# Patient Record
Sex: Female | Born: 1995 | Race: Black or African American | Hispanic: No | Marital: Single | Smoking: Never smoker
Health system: Southern US, Community
[De-identification: ages and names within clinical notes are randomized; demographics above are authoritative.]

## PROBLEM LIST (undated history)

## (undated) ENCOUNTER — Inpatient Hospital Stay (HOSPITAL_COMMUNITY): Payer: Self-pay

## (undated) ENCOUNTER — Ambulatory Visit (HOSPITAL_COMMUNITY): Source: Home / Self Care

## (undated) ENCOUNTER — Ambulatory Visit (HOSPITAL_COMMUNITY): Admission: EM | Payer: Medicaid Other | Source: Home / Self Care

## (undated) DIAGNOSIS — B9689 Other specified bacterial agents as the cause of diseases classified elsewhere: Secondary | ICD-10-CM

## (undated) DIAGNOSIS — Z789 Other specified health status: Secondary | ICD-10-CM

## (undated) DIAGNOSIS — N390 Urinary tract infection, site not specified: Secondary | ICD-10-CM

## (undated) DIAGNOSIS — N760 Acute vaginitis: Secondary | ICD-10-CM

## (undated) HISTORY — DX: Other specified health status: Z78.9

## (undated) HISTORY — PX: NO PAST SURGERIES: SHX2092

---

## 2018-01-17 ENCOUNTER — Emergency Department (HOSPITAL_COMMUNITY)
Admission: EM | Admit: 2018-01-17 | Discharge: 2018-01-17 | Disposition: A | Discharge status: Home / Self Care | Payer: Self-pay | Attending: Emergency Medicine | Admitting: Emergency Medicine

## 2018-01-17 ENCOUNTER — Encounter (HOSPITAL_COMMUNITY): Payer: Self-pay | Admitting: Emergency Medicine

## 2018-01-17 DIAGNOSIS — R300 Dysuria: Secondary | ICD-10-CM | POA: Insufficient documentation

## 2018-01-17 DIAGNOSIS — N898 Other specified noninflammatory disorders of vagina: Secondary | ICD-10-CM | POA: Insufficient documentation

## 2018-01-17 LAB — URINALYSIS, ROUTINE W REFLEX MICROSCOPIC
BACTERIA UA: NONE SEEN
BILIRUBIN URINE: NEGATIVE
GLUCOSE, UA: NEGATIVE mg/dL
HGB URINE DIPSTICK: NEGATIVE
KETONES UR: NEGATIVE mg/dL
NITRITE: NEGATIVE
PROTEIN: NEGATIVE mg/dL
Specific Gravity, Urine: 1.025 (ref 1.005–1.030)
pH: 6 (ref 5.0–8.0)

## 2018-01-17 LAB — WET PREP, GENITAL
CLUE CELLS WET PREP: NONE SEEN
Sperm: NONE SEEN
Trich, Wet Prep: NONE SEEN
Yeast Wet Prep HPF POC: NONE SEEN

## 2018-01-17 LAB — POC URINE PREG, ED: PREG TEST UR: NEGATIVE

## 2018-01-17 MED ORDER — AZITHROMYCIN 250 MG PO TABS
1000.0000 mg | ORAL_TABLET | Freq: Once | ORAL | Status: AC
  Administered 2018-01-17: 1000 mg via ORAL
  Filled 2018-01-17: qty 4

## 2018-01-17 MED ORDER — LIDOCAINE HCL 1 % IJ SOLN
INTRAMUSCULAR | Status: AC
  Administered 2018-01-17: 20 mL
  Filled 2018-01-17: qty 20

## 2018-01-17 MED ORDER — CEFTRIAXONE SODIUM 250 MG IJ SOLR
250.0000 mg | Freq: Once | INTRAMUSCULAR | Status: AC
  Administered 2018-01-17: 250 mg via INTRAMUSCULAR
  Filled 2018-01-17: qty 250

## 2018-01-17 MED ORDER — MICONAZOLE NITRATE 1200 & 2 MG & % VA KIT: 1.0000 | PACK | Freq: Once | VAGINAL | 0 refills | Status: AC

## 2018-01-17 NOTE — Discharge Instructions (Addendum)
Your workup was reassuring today.  You have STD cultures pending.  Please use monistat as directed.  Follow up with your pcp or obgyn this week if symptoms arent improving.  If you develop worsening or new concerning symptoms you can return to the emergency department for re-evaluation.

## 2018-01-17 NOTE — ED Provider Notes (Signed)
[facility] DEPT Provider Note   CSN: [id number] Arrival date & time: 01/17/18  5465     History   Chief Complaint Chief Complaint  Patient presents with  . Dysuria  . Vaginal Itching    HPI Jamie Neal is a 22 y.o. female with no significant past medical history presents emergency department today for dysuria and vaginal itching.  Patient reports that she began vaginal itching approximately 2-3 days ago.  She reports that one day ago she began having pain after urination (not with).  She believes this may be related to a yeast infection.  She denies any associated vaginal bleeding, abdominal pain, flank pain, fever, nausea or vomiting. She says she is having her "normal discharge" that she always has.  She states she is sexually active with female partners and does not use protection.  She notes she has not been sexually active since her most recent STD testing at the health clinic which were negative.  She does not wish for HIV or syphilis testing today but does request gonorrhea and chlamydia cultures no interventions prior to arrival.  HPI  History reviewed. No pertinent past medical history.  There are no active problems to display for this patient.   History reviewed. No pertinent surgical history.   OB History   None      Home Medications    Prior to Admission medications   Not on File    Family History No family history on file.  Social History Social History   Tobacco Use  . Smoking status: Never Smoker  . Smokeless tobacco: Never Used  Substance Use Topics  . Alcohol use: Not on file  . Drug use: Not on file     Allergies   Patient has no known allergies.   Review of Systems Review of Systems  All other systems reviewed and are negative.    Physical Exam Updated Vital Signs BP 100/75 (BP Location: Left Arm)   Pulse 84   Temp 97.9 F (36.6 C) (Oral)   Resp 17   Ht 5' (1.524 m)   Wt 75 kg   LMP  01/03/2018   SpO2 98%   BMI 32.27 kg/m   Physical Exam  Constitutional: She appears well-developed and well-nourished.  HENT:  Head: Normocephalic and atraumatic.  Right Ear: External ear normal.  Left Ear: External ear normal.  Nose: Nose normal.  Mouth/Throat: Uvula is midline, oropharynx is clear and moist and mucous membranes are normal. No tonsillar exudate.  Eyes: Pupils are equal, round, and reactive to light. Right eye exhibits no discharge. Left eye exhibits no discharge. No scleral icterus.  Neck: Trachea normal. Neck supple. No spinous process tenderness present. No neck rigidity. Normal range of motion present.  Cardiovascular: Normal rate, regular rhythm and intact distal pulses.  No murmur heard. Pulses:      Radial pulses are 2+ on the right side, and 2+ on the left side.       Dorsalis pedis pulses are 2+ on the right side, and 2+ on the left side.       Posterior tibial pulses are 2+ on the right side, and 2+ on the left side.  No lower extremity swelling or edema. Calves symmetric in size bilaterally.  Pulmonary/Chest: Effort normal and breath sounds normal. She exhibits no tenderness.  Abdominal: Soft. Bowel sounds are normal. There is no tenderness. There is no rigidity, no rebound, no guarding and no CVA tenderness.  Genitourinary:  Genitourinary Comments:  Exam performed by Jillyn Ledger, exam chaperoned Pelvic exam: normal external genitalia without evidence of trauma. There is noted vaginal erythem and irritation. The cervix is normal appearing without lesions & cervical motion tenderness absent, cervical os closed with out purulent discharge; vaginal discharge - white and curd-like, Wet prep and DNA probe for chlamydia and GC obtained.   ADNEXA: normal adnexa in size, nontender and no masses UTERUS: uterus is normal size, shape, consistency and nontender.   Musculoskeletal: She exhibits no edema.  Lymphadenopathy:    She has no cervical adenopathy.    Neurological: She is alert.  Skin: Skin is warm and dry. No rash noted. She is not diaphoretic.  Psychiatric: She has a normal mood and affect.  Nursing note and vitals reviewed.    ED Treatments / Results  Labs (all labs ordered are listed, but only abnormal results are displayed) Labs Reviewed  WET PREP, GENITAL - Abnormal; Notable for the following components:      Result Value   WBC, Wet Prep HPF POC MANY (*)    All other components within normal limits  URINALYSIS, ROUTINE W REFLEX MICROSCOPIC - Abnormal; Notable for the following components:   Leukocytes, UA MODERATE (*)    All other components within normal limits  POC URINE PREG, ED  GC/CHLAMYDIA PROBE AMP (Page) NOT AT [facility]    EKG None  Radiology No results found.  Procedures Procedures (including critical care time)  Medications Ordered in ED Medications  cefTRIAXone (ROCEPHIN) injection 250 mg (250 mg Intramuscular Given 01/17/18 1230)  azithromycin (ZITHROMAX) tablet 1,000 mg (1,000 mg Oral Given 01/17/18 1235)  lidocaine (XYLOCAINE) 1 % (with pres) injection (20 mLs  Given 01/17/18 1230)     Initial Impression / Assessment and Plan / ED Course  I have reviewed the triage vital signs and the nursing notes.  Pertinent labs & imaging results that were available during my care of the patient were reviewed by me and considered in my medical decision making (see chart for details).     22 y.o. female with vaginal itching as well as dysuria after urination.  Patient is sexually active with female partners.  Patient vital signs are reassuring on presentation.  She denies any fever, abdominal pain, nausea/vomiting.  Abdominal exam is benign without any tenderness.  No peritoneal signs.  Pelvic exam without cervical motion tenderness.  No concern for PID.  Gonorrhea, chlamydia and wet prep cultures were obtained.  Patient's UA with moderate leukocytes but no bacteria seen.  I will not treat for UTI.  She is  without fever, nausea/vomiting, CVA tenderness.  Do not suspect pyelo.  Pregnancy test is negative.  Wet prep is with many white blood cells.  No evidence of BV, yeast infection or trichomonas.  Patient was treated prophylactically for gonorrhea and chlamydia with azithromycin and ceftriaxone.  Will treat vaginitis with Monistat.  Recommended follow-up with OB/GYN.  Patient understands she has cultures pending.  She is to remain sexually abstinent until G/C result. Patient did not wish for HIV/RPR testing. Return precautions discussed. Patient appears safe for discharge.   Final Clinical Impressions(s) / ED Diagnoses   Final diagnoses:  Vaginal itching    ED Discharge Orders         Ordered    miconazole (MONISTAT 1 COMBO PACK) kit   Once     01/17/18 1233           Lorelle Gibbs 01/17/18 1437    Isla Pence,  MD 01/17/18 1501

## 2018-01-17 NOTE — ED Triage Notes (Signed)
Pt reports pain with urinating since yesterday and vaginal itching for couple days. Pt not for sure since she recently started shaving down there if that is what is causing the itch or not. Pt also wants to be checked for STDs. Reports "having just the normal discharge".

## 2018-01-17 NOTE — ED Notes (Signed)
Assisted PA with pelvic exam ?

## 2018-01-17 NOTE — ED Notes (Signed)
Pt ambulatory to restroom

## 2018-01-18 LAB — GC/CHLAMYDIA PROBE AMP (~~LOC~~) NOT AT ARMC
CHLAMYDIA, DNA PROBE: NEGATIVE
NEISSERIA GONORRHEA: NEGATIVE

## 2018-02-01 ENCOUNTER — Emergency Department (HOSPITAL_COMMUNITY)
Admission: EM | Admit: 2018-02-01 | Discharge: 2018-02-01 | Discharge status: Against Medical Advice | Payer: Self-pay | Attending: Emergency Medicine | Admitting: Emergency Medicine

## 2018-02-01 ENCOUNTER — Encounter (HOSPITAL_COMMUNITY): Payer: Self-pay | Admitting: Emergency Medicine

## 2018-02-01 ENCOUNTER — Other Ambulatory Visit: Payer: Self-pay

## 2018-02-01 DIAGNOSIS — Z202 Contact with and (suspected) exposure to infections with a predominantly sexual mode of transmission: Secondary | ICD-10-CM | POA: Insufficient documentation

## 2018-02-01 DIAGNOSIS — Z5321 Procedure and treatment not carried out due to patient leaving prior to being seen by health care provider: Secondary | ICD-10-CM | POA: Insufficient documentation

## 2018-02-01 NOTE — ED Triage Notes (Signed)
Pt verbalizes would like to be checked for STD related to having new partner; denies symptoms.

## 2018-02-03 NOTE — ED Provider Notes (Signed)
My student went to evaluate the patient and advised that she was here to be checked for STDs that she was dating someone new.  I went to evaluate the patient and walked into the room there is nobody there.  The patient told the nurse she was upset because she did not understand why we could not do a mouth swab for STDs.  The patient thought that all the HIV and STD testing only required a mouth swab.  Again I was unable to evaluate the patient due to the fact that she had left before I was able to get into the room.   Charlestine Night, PA-C 02/03/18 0045    Sabas Sous, MD 02/03/18 2221

## 2019-07-21 ENCOUNTER — Encounter: Payer: Self-pay | Admitting: *Deleted

## 2019-08-16 ENCOUNTER — Ambulatory Visit (HOSPITAL_COMMUNITY)
Admission: EM | Admit: 2019-08-16 | Discharge: 2019-08-16 | Disposition: A | Discharge status: Home / Self Care | Payer: Medicaid - Out of State | Attending: Emergency Medicine | Admitting: Emergency Medicine

## 2019-08-16 ENCOUNTER — Other Ambulatory Visit: Payer: Self-pay

## 2019-08-16 ENCOUNTER — Encounter (HOSPITAL_COMMUNITY): Payer: Self-pay

## 2019-08-16 ENCOUNTER — Inpatient Hospital Stay (HOSPITAL_COMMUNITY): Payer: Medicaid Other

## 2019-08-16 ENCOUNTER — Inpatient Hospital Stay (HOSPITAL_COMMUNITY)
Admission: AD | Admit: 2019-08-16 | Discharge: 2019-08-16 | Discharge status: Against Medical Advice | Payer: Medicaid Other | Attending: Obstetrics & Gynecology | Admitting: Obstetrics & Gynecology

## 2019-08-16 ENCOUNTER — Encounter (HOSPITAL_COMMUNITY): Payer: Self-pay | Admitting: Obstetrics & Gynecology

## 2019-08-16 DIAGNOSIS — Z5329 Procedure and treatment not carried out because of patient's decision for other reasons: Secondary | ICD-10-CM | POA: Diagnosis not present

## 2019-08-16 DIAGNOSIS — O26891 Other specified pregnancy related conditions, first trimester: Secondary | ICD-10-CM | POA: Insufficient documentation

## 2019-08-16 DIAGNOSIS — Z3A11 11 weeks gestation of pregnancy: Secondary | ICD-10-CM | POA: Diagnosis not present

## 2019-08-16 DIAGNOSIS — R109 Unspecified abdominal pain: Secondary | ICD-10-CM | POA: Insufficient documentation

## 2019-08-16 DIAGNOSIS — Z3201 Encounter for pregnancy test, result positive: Secondary | ICD-10-CM | POA: Diagnosis not present

## 2019-08-16 DIAGNOSIS — Z3A10 10 weeks gestation of pregnancy: Secondary | ICD-10-CM | POA: Insufficient documentation

## 2019-08-16 DIAGNOSIS — O26899 Other specified pregnancy related conditions, unspecified trimester: Secondary | ICD-10-CM

## 2019-08-16 DIAGNOSIS — R102 Pelvic and perineal pain: Secondary | ICD-10-CM

## 2019-08-16 DIAGNOSIS — N898 Other specified noninflammatory disorders of vagina: Secondary | ICD-10-CM

## 2019-08-16 LAB — COMPREHENSIVE METABOLIC PANEL
ALT: 12 U/L (ref 0–44)
AST: 17 U/L (ref 15–41)
Albumin: 3.4 g/dL — ABNORMAL LOW (ref 3.5–5.0)
Alkaline Phosphatase: 43 U/L (ref 38–126)
Anion gap: 9 (ref 5–15)
BUN: 9 mg/dL (ref 6–20)
CO2: 24 mmol/L (ref 22–32)
Calcium: 9.2 mg/dL (ref 8.9–10.3)
Chloride: 105 mmol/L (ref 98–111)
Creatinine, Ser: 0.57 mg/dL (ref 0.44–1.00)
GFR calc Af Amer: >60 mL/min (ref >60)
GFR calc non Af Amer: >60 mL/min (ref >60)
Glucose, Bld: 75 mg/dL (ref 70–99)
Potassium: 3.6 mmol/L (ref 3.5–5.1)
Sodium: 138 mmol/L (ref 135–145)
Total Bilirubin: 0.8 mg/dL (ref 0.3–1.2)
Total Protein: 7.6 g/dL (ref 6.5–8.1)

## 2019-08-16 LAB — POCT URINALYSIS DIP (DEVICE)
Glucose, UA: NEGATIVE mg/dL
Ketones, ur: 40 mg/dL — AB
Nitrite: NEGATIVE
Protein, ur: 30 mg/dL — AB
Specific Gravity, Urine: >=1.03 (ref 1.005–1.030)
Urobilinogen, UA: 1 mg/dL (ref 0.0–1.0)
pH: 6 (ref 5.0–8.0)

## 2019-08-16 LAB — URINALYSIS, ROUTINE W REFLEX MICROSCOPIC
Bacteria, UA: NONE SEEN
Bilirubin Urine: NEGATIVE
Glucose, UA: NEGATIVE mg/dL
Ketones, ur: 5 mg/dL — AB
Leukocytes,Ua: NEGATIVE
Nitrite: NEGATIVE
Protein, ur: 30 mg/dL — AB
Specific Gravity, Urine: 1.032 — ABNORMAL HIGH (ref 1.005–1.030)
pH: 5 (ref 5.0–8.0)

## 2019-08-16 LAB — HCG, QUANTITATIVE, PREGNANCY: hCG, Beta Chain, Quant, S: 180024 m[IU]/mL — ABNORMAL HIGH (ref <5)

## 2019-08-16 LAB — ABO/RH: ABO/RH(D): O POS

## 2019-08-16 LAB — CBC
HCT: 38.9 % (ref 36.0–46.0)
Hemoglobin: 13.5 g/dL (ref 12.0–15.0)
MCH: 31.3 pg (ref 26.0–34.0)
MCHC: 34.7 g/dL (ref 30.0–36.0)
MCV: 90 fL (ref 80.0–100.0)
Platelets: 226 10*3/uL (ref 150–400)
RBC: 4.32 MIL/uL (ref 3.87–5.11)
RDW: 13.3 % (ref 11.5–15.5)
WBC: 6.7 10*3/uL (ref 4.0–10.5)
nRBC: 0 % (ref 0.0–0.2)

## 2019-08-16 LAB — POC URINE PREG, ED: Preg Test, Ur: POSITIVE — AB

## 2019-08-16 LAB — POCT PREGNANCY, URINE: Preg Test, Ur: POSITIVE — AB

## 2019-08-16 MED ORDER — METRONIDAZOLE 0.75 % VA GEL: 1.0000 | Freq: Every day | VAGINAL | 0 refills | Status: AC

## 2019-08-16 NOTE — Progress Notes (Signed)
Pt decided to leave AMA, form signed and put with medical records.

## 2019-08-16 NOTE — ED Provider Notes (Signed)
[facility]    CSN: [id number] Arrival date & time: 08/16/19  1135      History   Chief Complaint Chief Complaint  Patient presents with  . Vaginal Discharge  . Possible Pregnancy    HPI Jamie Neal is a 24 y.o. female no significant past medical history presenting today for evaluation of pregnancy, discharge and abdominal pain.  Patient has had at home pregnancy tests that are positive.  Last menstrual cycle was around February.  She reports that she has had discharge and odor which has progressively worsening over the past month.  She feels similar to prior BV infections.  She also has associated urinary frequency which she typically experiences with BV.  Denies dysuria.  She has had some occasional intermittent abdominal cramping.  Denies currently.  Has had 1 prior pregnancy with no complications.  Has not establish care with OB/GYN yet.  HPI  History reviewed. No pertinent past medical history.  There are no problems to display for this patient.   History reviewed. No pertinent surgical history.  OB History    Gravida  1   Para      Term      Preterm      AB      Living        SAB      TAB      Ectopic      Multiple      Live Births               Home Medications    Prior to Admission medications   Medication Sig Start Date End Date Taking? Authorizing Provider  metroNIDAZOLE (METROGEL VAGINAL) 0.75 % vaginal gel Place 1 Applicatorful vaginally at bedtime for 5 days. 08/16/19 08/21/19  Chevi Lim, Junius Creamer, PA-C    Family History History reviewed. No pertinent family history.  Social History Social History   Tobacco Use  . Smoking status: Never Smoker  . Smokeless tobacco: Never Used  Substance Use Topics  . Alcohol use: Not on file  . Drug use: Not on file     Allergies   Patient has no known allergies.   Review of Systems Review of Systems  Constitutional: Negative for fever.  Respiratory: Negative for shortness  of breath.   Cardiovascular: Negative for chest pain.  Gastrointestinal: Negative for abdominal pain, diarrhea, nausea and vomiting.  Genitourinary: Positive for frequency and vaginal discharge. Negative for dysuria, flank pain, genital sores, hematuria, menstrual problem, vaginal bleeding and vaginal pain.  Musculoskeletal: Negative for back pain.  Skin: Negative for rash.  Neurological: Negative for dizziness, light-headedness and headaches.     Physical Exam Triage Vital Signs ED Triage Vitals [08/16/19 1223]  Enc Vitals Group     BP 100/69     Pulse Rate 72     Resp 16     Temp 98.4 F (36.9 C)     Temp Source Oral     SpO2 100 %     Weight      Height      Head Circumference      Peak Flow      Pain Score 0     Pain Loc      Pain Edu?      Excl. in GC?    No data found.  Updated Vital Signs BP 100/69 (BP Location: Left Arm)   Pulse 72   Temp 98.4 F (36.9 C) (Oral)   Resp 16   SpO2  100%   Visual Acuity Right Eye Distance:   Left Eye Distance:   Bilateral Distance:    Right Eye Near:   Left Eye Near:    Bilateral Near:     Physical Exam Vitals and nursing note reviewed.  Constitutional:      Appearance: She is well-developed.     Comments: No acute distress  HENT:     Head: Normocephalic and atraumatic.     Nose: Nose normal.  Eyes:     Conjunctiva/sclera: Conjunctivae normal.  Cardiovascular:     Rate and Rhythm: Normal rate.  Pulmonary:     Effort: Pulmonary effort is normal. No respiratory distress.  Abdominal:     General: There is no distension.  Genitourinary:    Comments: Normal external female genitalia, no rashes or lesions noted externally, vaginal mucosa pink, white thicker discharge noted Musculoskeletal:        General: Normal range of motion.     Cervical back: Neck supple.  Skin:    General: Skin is warm and dry.  Neurological:     Mental Status: She is alert and oriented to person, place, and time.      UC Treatments /  Results  Labs (all labs ordered are listed, but only abnormal results are displayed) Labs Reviewed  POC URINE PREG, ED - Abnormal; Notable for the following components:      Result Value   Preg Test, Ur POSITIVE (*)    All other components within normal limits  POCT URINALYSIS DIP (DEVICE) - Abnormal; Notable for the following components:   Bilirubin Urine SMALL (*)    Ketones, ur 40 (*)    Hgb urine dipstick SMALL (*)    Protein, ur 30 (*)    Leukocytes,Ua SMALL (*)    All other components within normal limits  POCT PREGNANCY, URINE - Abnormal; Notable for the following components:   Preg Test, Ur POSITIVE (*)    All other components within normal limits  URINE CULTURE  CERVICOVAGINAL ANCILLARY ONLY    EKG   Radiology No results found.  Procedures Procedures (including critical care time)  Medications Ordered in UC Medications - No data to display  Initial Impression / Assessment and Plan / UC Course  I have reviewed the triage vital signs and the nursing notes.  Pertinent labs & imaging results that were available during my care of the patient were reviewed by me and considered in my medical decision making (see chart for details).     Pregnancy test positive.  Will send urine for culture.  Typically has urinary symptoms with BV, patient feels strongly symptoms are related to BV.  Discussed with patient empirically treating versus deferring in setting of pregnancy and patient wished to go ahead and proceed with treatment, will provide MetroGel to use over the next 5 days.  Will call with results of urine culture and vaginal swab and provide further treatment as needed.  Abdominal pain currently intermittent and currently without pain, likely in relation to discharge/possible BV.  Advised patient if pain progressing or worsening to follow-up at MAU for further evaluation.  Provided contacts for setting up care with OB/GYN.  Discussed strict return precautions. Patient  verbalized understanding and is agreeable with plan.  Final Clinical Impressions(s) / UC Diagnoses   Final diagnoses:  Positive pregnancy test  Vaginal discharge     Discharge Instructions     Please establish care with OB/GYN for follow-up prenatal care Urine culture pending to check for  UTI Vaginal swab pending to screen for STDs/yeast and BV May begin MetroGel at bedtime for the next 5 days  If you develop returning abdominal pain please follow-up at [facility] for further evaluation   ED Prescriptions    Medication Sig Dispense Auth. Provider   metroNIDAZOLE (METROGEL VAGINAL) 0.75 % vaginal gel Place 1 Applicatorful vaginally at bedtime for 5 days. 70 g Elton Catalano, Winfield C, PA-C     PDMP not reviewed this encounter.   Janith Lima, PA-C 08/16/19 1324

## 2019-08-16 NOTE — MAU Provider Note (Signed)
First Provider Initiated Contact with Patient 08/16/19 1529      S Ms. Anaiah Mcmannis is a 24 y.o. G2P1001 pregnant female at [redacted]w[redacted]d who presents to MAU today with complaint of cramping. Patient was seen in the ED earlier today and evaluated for vaginal discharge and abdominal pain and did report positive pregnancy test at home. Patient was not transferred to MAU for evaluation.  O BP (!) 103/53 (BP Location: Right Arm)   Pulse 71   Temp 98.3 F (36.8 C) (Oral)   Resp 16   Ht 5' (1.524 m)   Wt 76.4 kg   SpO2 100% Comment: room air  BMI 32.91 kg/m    Patient Vitals for the past 24 hrs:  BP Temp Temp src Pulse Resp SpO2 Height Weight  08/16/19 1343 (!) 103/53 98.3 F (36.8 C) Oral 71 16 100 % -- --  08/16/19 1340 -- -- -- -- -- -- 5' (1.524 m) 76.4 kg   Physical Exam  Constitutional: She is oriented to person, place, and time. She appears well-developed and well-nourished. No distress.  HENT:  Head: Normocephalic and atraumatic.  Respiratory: Effort normal.  Neurological: She is alert and oriented to person, place, and time.  Skin: She is not diaphoretic.  Psychiatric: She has a normal mood and affect. Her behavior is normal. Judgment and thought content normal.   A Pregnant female Medical screening exam complete Ectopic work-up With labs and [place] report pending, patient comes to nurses station dressed saying she needs to leave Patient informed by provider that results not yet available and patient will need to sign out AMA as final diagnosis cannot be made. Patient willing to sign AMA paperwork and requesting [place] photos.  P Pt left MAU AMA pending labs and ultrasound report  Laterra Lubinski, Odie Sera, NP 08/16/2019 3:57 PM

## 2019-08-16 NOTE — ED Triage Notes (Signed)
Pt present vaginal discharge with foul odor and pregnancy. Symptoms started over a month ago but has gotten worst with the odor.

## 2019-08-16 NOTE — MAU Note (Signed)
Jamie Neal is a 24 y.o. at [redacted]w[redacted]d here in MAU reporting: cramping since 2 days ago. Has not tried any medication. No bleeding or discharge.  Onset of complaint: 2 days ago  Pain score: 7/10  Vitals:   08/16/19 1343  BP: (!) 103/53  Pulse: 71  Resp: 16  Temp: 98.3 F (36.8 C)  SpO2: 100%     Lab orders placed from triage: UA

## 2019-08-16 NOTE — Discharge Instructions (Signed)
Please establish care with OB/GYN for follow-up prenatal care Urine culture pending to check for UTI Vaginal swab pending to screen for STDs/yeast and BV May begin MetroGel at bedtime for the next 5 days  If you develop returning abdominal pain please follow-up at [facility] for further evaluation

## 2019-08-17 LAB — URINE CULTURE: Culture: <10000 — AB

## 2019-08-18 ENCOUNTER — Other Ambulatory Visit: Payer: Self-pay

## 2019-08-18 ENCOUNTER — Ambulatory Visit (INDEPENDENT_AMBULATORY_CARE_PROVIDER_SITE_OTHER): Payer: Medicaid Other | Admitting: *Deleted

## 2019-08-18 DIAGNOSIS — E66811 Obesity, class 1: Secondary | ICD-10-CM | POA: Insufficient documentation

## 2019-08-18 DIAGNOSIS — E669 Obesity, unspecified: Secondary | ICD-10-CM

## 2019-08-18 DIAGNOSIS — F419 Anxiety disorder, unspecified: Secondary | ICD-10-CM

## 2019-08-18 DIAGNOSIS — Z3490 Encounter for supervision of normal pregnancy, unspecified, unspecified trimester: Secondary | ICD-10-CM | POA: Insufficient documentation

## 2019-08-18 LAB — CERVICOVAGINAL ANCILLARY ONLY
Bacterial Vaginitis (gardnerella): POSITIVE — AB
Candida Glabrata: NEGATIVE
Candida Vaginitis: POSITIVE — AB
Chlamydia: NEGATIVE
Comment: NEGATIVE
Comment: NEGATIVE
Comment: NEGATIVE
Comment: NEGATIVE
Comment: NEGATIVE
Comment: NORMAL
Neisseria Gonorrhea: NEGATIVE
Trichomonas: NEGATIVE

## 2019-08-18 NOTE — Progress Notes (Signed)
I connected with  Jamie Neal on 08/18/19 at  1:30 PM EDT by telephone and verified that I am speaking with the correct person using two identifiers.   I discussed the limitations, risks, security and privacy concerns of performing an evaluation and management service by telephone and the availability of in person appointments. I also discussed with the patient that there may be a patient responsible charge related to this service. The patient expressed understanding and agreed to proceed.  I explained I am completing her New OB Intake today. We discussed Her EDD and that it is based on early [place] .She reports LMP was around 06/06/19 not 05/30/19 as reported. I explained provider will review EDD at her new ob visit and may change it.  I reviewed her allergies, meds, OB History, Medical /Surgical history, and appropriate screenings. I informed her of [facility] services. She did have + PHQ9 and GAD 7 , I offered referral to [facility] . She accepted. I explained registar will contact her with appointment.  She has had some food insecurity and states getting better. I explained will put food resources in her visit notes and she can see on MyChart or get a copy next week.  I explained I will send her the [employer] app and app was sent to her while on phone- she will download after call completed.  I explained we will ask that she purchase a  blood pressure cuff  To check her blood pressure weekly because her Insurance [facility]) does not cover blood pressure cuff RX. I  Explained  then we will have her take her blood pressure weekly and enter into the app. I explained she will have some visits in office and some virtually. I sent her MyChart text to sign up for  MyChart app. She will download after call completed.  I reviewed her new ob  appointment date/ time with her , our location and to wear mask, no visitors.  I explained she will have a pelvic exam, ob bloodwork, hemoglobin a1C, cbg ,pap, and  genetic testing if desired,- she  does want a panorama. I scheduled an [place] at 19 weeks and gave her the appointment. She voices understanding.   Jamie Nikkel,RN 08/18/2019  1:25 PM

## 2019-08-18 NOTE — Progress Notes (Signed)
I have reviewed the chart and agree with nursing staff's documentation of this patient's encounter.  Raelyn Mora, CNM 08/18/2019 9:12 PM

## 2019-08-18 NOTE — Patient Instructions (Signed)
At [facility] [employer], we work as an integrated team, providing care to address both physical and emotional health. Your medical provider may refer you to see our [facility]) on the same day you see your medical provider, as availability permits.  Our [facility] is available to all patients, visits generally last between 20-30 minutes, but can be longer or shorter, depending on patient need. The [facility] offers help with stress management, coping with symptoms of depression and anxiety, major life changes , sleep issues, changing risky behavior, grief and loss, life stress, working on personal life goals, and  behavioral health issues, as these all affect your overall health and wellness.  The [facility] is NOT available for the following: court-ordered evaluations, specialty assessments (custody or disability), letters to employers, or obtaining certification for an emotional support animal. The [facility] does not provide long-term therapeutic services. You have the right to refuse integrated behavioral health services, or to reschedule to see the [facility] at a later date.  Exception: If you are having thoughts of suicide, we require that you either see the [facility] for further assessment, or contract for safety with your medical provider prior to checking out.  Confidentiality exception: If it is suspected that a child or disabled adult is being abused or neglected, we are required by law to report that to either Child Protective Services or Adult Scientist, forensic.  If you have a diagnosis of Bipolar affective disorder, Schizophrenia, or recurrent Major depressive disorder, we will recommend that you establish care with a psychiatrist, as these are lifelong, chronic conditions, and we want your overall emotional health and medications to be more closely monitored. If you anticipate needing extended maternity leave due to mental illness, it it recommended that you find a psychiatrist as soon as possible.  Neither the medical provider, nor the [facility], can recommend an extended maternity leave for mental health issues. Your medical provider or [facility] may refer you to a therapist for ongoing, traditional therapy, or to a psychiatrist, for medication management, if it would benefit your overall health. Depending on your insurance, you may have a copay to see the [facility]. If you are uninsured, it is recommended that you apply for financial assistance. (Forms may be requested at the front desk for in-person visits, via MyChart, or request a form during a virtual visit).  If you see the [facility] more than 6 times, you will have to complete a comprehensive clinical assessment interview with the [facility] to resume integrated services.  Any questions?   [facility] [address] [place], [place] [address] [phone]   or  www.http://james-[email or web address]/ **SNAP/EBT/ Other nutritional benefits  [facility] [address], [place], [place] [address] [phone]  or  https://palmer-smith.com/ **WIC for  women who are pregnant and postpartum, infants and children up to 77 years old  [place] [address] [place], [place] [address] [phone]   or   www.theblessedtable.org  **Food pantry  [employer] [address], [place], [place] [address] [phone]   or   https://brotherkolbes.godaddysites.com  **Emergency food and prepared meals  [place] [address], [place], [place] [address] [phone]   or   www.cedargrovetop.us **Food pantry  [place] [facility] [address] [place],  [address] [phone]   or   www.https://hartman-jones.net/ **Food pantry  [facility]  Food Pantry [address]  [place], [place] [address] [phone] **Food pantry  [facility] [address] [place], [place] [address] [phone]   or   www.greensborourbanministry.org  Insurance underwriter and prepared meals  [facility] [address] [place], [place], [place], [place] [address] DomainerFinder.be  **Food pantry  [place] [employer] [facility] [address] [place], [place] [address] [phone]   or   www.lbcnow.org  **Food pantry  [facility] [address], [place], [place] [address] [phone]   or   http://patterson-parker.net/ **Food pantry, nutrition education, gardening activities  [place] [address], [place], [place] [address] [phone] **Food pantry  [facility] [place] [address], [place], [place] [address] [phone]   or   www.salvationarmyofgreensboro.Lovette Cliche of [address], [place], [place] [address] [phone]   or   http://senior-resources-[place].org [facility] on [place] [address] [place], [place] [address] [phone]   or   www.stmattchurch.com  **Food pantry  [place] [address] [place], [place] [address] [phone]   or   vandaliapresbyterianchurch.org **Food pantry  [place] [facility] [address] [place], [place], [place] [address] [phone]   or   www.Mormon101.be Food Civil Service fast streamer of [place] [address] Jacinto Reap [place], [place] [address] [phone] **Food pantry  [place] [facility]   Department of [facility] [address] [place], [place] [address] [phone]   or   www.co.[place].us/ph/  [facility]) [address], [place], [place] [address] [phone]   or   MedicationWebsites.com.au **WIC for pregnant and postpartum women, infants and children up to 33 years old  [facility] [address], [place], [place] [address] [phone] **Food pantry  [place] [address] [place], [place] [address] [phone]   or   emerywoodbaptistchurch.com *Food pantry  [employer] [facility] [address], [place], [place] [address] [phone]   or   www.fcchighpoint.Radonna RickerFood pantry  [facility] [address], [place], [place] [address] [phone]   or   http://www.green.com/ **Food pantry  [place] [address], [place], [place] [address] [phone]   or   www.facebook.com/KBCI1 Social worker of [place] [address], [place], [place] [address] [phone]   or   www.abbottscreek.org [employer] of [place] [phone]   **Delivers meals  [facility] [address], [place], [place] [address] [phone]   or   nbfgm.sundaystreamwebsites.com  Programme researcher, broadcasting/film/video of [employer] [address] [place], [place] [address] [phone]   or   www.odm-hp.org  **Food pantry  [place] [address] [place], [place] [address] [phone]   or   R2live.tv **Food pantry  [place] [address], [place], [place] [address] [phone]   or   ClubMonetize.fr **Emergency food and pet food  [facility] [place] [address] [place], [place] [address] [phone]   or   www.senioradults.org **Congregate and delivered meals to older adults  [place] Way of [place] [address] [place],  [place] [address] [phone]  or   https://www.miller-montoya.com/ **Back Pack Program for elementary school students  [place] [facility] [address] Cinda Quest [place], [place] [address] [phone]   or   www.wardstreetcommunityresources.org **Food pantry  [facility] [address] [place], [place] [address] [phone]   or   ResumeQuery.com.ee **Emergency food, nutrition classes, food budgeting

## 2019-08-19 ENCOUNTER — Telehealth (HOSPITAL_COMMUNITY): Payer: Self-pay

## 2019-08-19 MED ORDER — AMOXICILLIN 500 MG PO CAPS: 500.0000 mg | ORAL_CAPSULE | Freq: Two times a day (BID) | ORAL | 0 refills | Status: AC

## 2019-08-19 MED ORDER — TERCONAZOLE 0.4 % VA CREA: 1.0000 | TOPICAL_CREAM | Freq: Every day | VAGINAL | 0 refills | Status: DC

## 2019-08-19 NOTE — BH Specialist Note (Signed)
Pt did not arrive to video visit and did not answer the phone ; Left HIPPA-compliant message to call back Jamie Neal from [facility] at [phone]. ; Pt does not have MyChart.    Integrated Behavioral Health via Telemedicine Video Visit  08/19/2019 Jamie Neal [id number]   [name]  Depression screen [facility] 2/9 08/18/2019  Decreased Interest 1  Down, Depressed, Hopeless 1  PHQ - 2 Score 2  Altered sleeping 1  Tired, decreased energy 3  Change in appetite 3  Feeling bad or failure about yourself  0  Trouble concentrating 1  Moving slowly or fidgety/restless 0  Suicidal thoughts 0  PHQ-9 Score 10   GAD 7 : Generalized Anxiety Score 08/18/2019  Nervous, Anxious, on Edge 1  Control/stop worrying 3  Worry too much - different things 1  Trouble relaxing 1  Restless 0  Easily annoyed or irritable 3  Afraid - awful might happen 1  Total GAD 7 Score 10

## 2019-08-25 ENCOUNTER — Telehealth: Payer: Self-pay | Admitting: Obstetrics and Gynecology

## 2019-08-25 ENCOUNTER — Ambulatory Visit: Payer: Medicaid Other | Admitting: Clinical

## 2019-08-25 ENCOUNTER — Encounter: Payer: Self-pay | Admitting: Obstetrics and Gynecology

## 2019-08-25 NOTE — Telephone Encounter (Signed)
Attempted to contact patient to get her rescheduled for her missed ob appointment. No answer, left voicemail for patient to give the office a call back to be rescheduled.  

## 2019-10-20 ENCOUNTER — Ambulatory Visit: Payer: Medicaid Other

## 2019-10-20 ENCOUNTER — Inpatient Hospital Stay (HOSPITAL_COMMUNITY)
Admission: AD | Admit: 2019-10-20 | Discharge: 2019-10-20 | Disposition: A | Discharge status: Home / Self Care | Payer: Medicaid Other | Attending: Obstetrics and Gynecology | Admitting: Obstetrics and Gynecology

## 2019-10-20 ENCOUNTER — Encounter (HOSPITAL_COMMUNITY): Payer: Self-pay | Admitting: Obstetrics and Gynecology

## 2019-10-20 ENCOUNTER — Other Ambulatory Visit: Payer: Self-pay

## 2019-10-20 DIAGNOSIS — O26899 Other specified pregnancy related conditions, unspecified trimester: Secondary | ICD-10-CM

## 2019-10-20 DIAGNOSIS — Z3A19 19 weeks gestation of pregnancy: Secondary | ICD-10-CM | POA: Diagnosis not present

## 2019-10-20 DIAGNOSIS — O26892 Other specified pregnancy related conditions, second trimester: Secondary | ICD-10-CM | POA: Insufficient documentation

## 2019-10-20 DIAGNOSIS — R109 Unspecified abdominal pain: Secondary | ICD-10-CM | POA: Diagnosis not present

## 2019-10-20 DIAGNOSIS — Z3492 Encounter for supervision of normal pregnancy, unspecified, second trimester: Secondary | ICD-10-CM

## 2019-10-20 DIAGNOSIS — E669 Obesity, unspecified: Secondary | ICD-10-CM

## 2019-10-20 LAB — URINALYSIS, ROUTINE W REFLEX MICROSCOPIC
Bilirubin Urine: NEGATIVE
Glucose, UA: NEGATIVE mg/dL
Hgb urine dipstick: NEGATIVE
Ketones, ur: NEGATIVE mg/dL
Leukocytes,Ua: NEGATIVE
Nitrite: NEGATIVE
Protein, ur: NEGATIVE mg/dL
Specific Gravity, Urine: 1.018 (ref 1.005–1.030)
pH: 8 (ref 5.0–8.0)

## 2019-10-20 LAB — WET PREP, GENITAL
Clue Cells Wet Prep HPF POC: NONE SEEN
Sperm: NONE SEEN
Trich, Wet Prep: NONE SEEN
Yeast Wet Prep HPF POC: NONE SEEN

## 2019-10-20 NOTE — MAU Note (Addendum)
Presents with c/o intermittent sharp abdominal pain, reports "it's not consistent like ctxs". State not currently having pain, but feels it once lying down. Denies VB.

## 2019-10-20 NOTE — MAU Provider Note (Signed)
History     CSN: [id number]  Arrival date and time: 10/20/19 1043    First Provider Initiated Contact with Patient 10/20/19 1217       Chief Complaint  Patient presents with  . Abdominal Pain   [name] is a 24 y.o. year old G53P1001 female at [redacted]w[redacted]d weeks gestation who presents to MAU reporting lower intermittent sharp abdominal pain that is "not consistent like contractions".  She currently denies having any pain but feels it once she lies down.  She denies vaginal bleeding or any abnormal vaginal discharge.  Per epic she was scheduled for an ultrasound this morning at 8:30 AM and did not keep that appointment.  She states she was unaware of any appointments scheduled at the [facility].   OB History    Gravida  2   Para  1   Term  1   Preterm      AB      Living  1     SAB      TAB      Ectopic      Multiple      Live Births  1           History reviewed. No pertinent past medical history.  Past Surgical History:  Procedure Laterality Date  . NO PAST SURGERIES      Family History  Problem Relation Age of Onset  . Hypertension Father     Social History   Tobacco Use  . Smoking status: Never Smoker  . Smokeless tobacco: Never Used  Vaping Use  . Vaping Use: Never used  Substance Use Topics  . Alcohol use: Not Currently    Comment: occasionally  . Drug use: Never    Allergies: No Known Allergies  Medications Prior to Admission  Medication Sig Dispense Refill Last Dose  . Prenatal MV-Min-FA-Omega-3 (PRENATAL GUMMIES/DHA & FA PO) Take 2 tablets by mouth daily.     Marland Kitchen terconazole (TERAZOL 7) 0.4 % vaginal cream Place 1 applicator vaginally at bedtime. 45 g 0     Review of Systems  Constitutional: Negative.   HENT: Negative.   Eyes: Negative.   Respiratory: Negative.   Cardiovascular: Negative.   Gastrointestinal: Negative.   Endocrine: Negative.   Genitourinary: Positive for pelvic pain (intermittent, sharp. None now  -- only felt with lying down).  Musculoskeletal: Negative.   Skin: Negative.   Allergic/Immunologic: Negative.   Neurological: Negative.   Hematological: Negative.   Psychiatric/Behavioral: Negative.    Physical Exam   Blood pressure 103/61, pulse 90, temperature 98.4 F (36.9 C), temperature source Oral, resp. rate 18, height 5' (1.524 m), weight 78.2 kg, last menstrual period 06/06/2019, SpO2 100 %.  Physical Exam  Nursing note and vitals reviewed. Constitutional: She is oriented to person, place, and time. She appears well-developed.  HENT:  Head: Normocephalic and atraumatic.  Cardiovascular: Normal rate and regular rhythm.  Respiratory: Effort normal.  GI: Soft.  Genitourinary:    Genitourinary Comments: Speculum declined by patient   Neurological: She is alert and oriented to person, place, and time.  Skin: Skin is warm and dry.  Psychiatric: Her behavior is normal. Mood normal.   FHTs by doppler: 156 bpm  MAU Course  Procedures   Patient informed that the ultrasound is considered a limited OB ultrasound and is not intended to be a complete ultrasound exam.  Patient also informed that the ultrasound is not being completed with the intent of assessing  for fetal or placental anomalies or any pelvic abnormalities.  Explained that the purpose of today's ultrasound is to assess for patient reassurance.  Baby was found to be in a viable and appears to be S=D. Patient acknowledges the purpose of the exam and the limitations of the study.    MDM Wet Prep -- self-swab per pt request GC/CT -- self-swab per pt request  Results for orders placed or performed during the hospital encounter of 10/20/19 (from the past 24 hour(s))  Urinalysis, Routine w reflex microscopic     Status: None   Collection Time: 10/20/19 11:28 AM  Result Value Ref Range   Color, Urine YELLOW YELLOW   APPearance CLEAR CLEAR   Specific Gravity, Urine 1.018 1.005 - 1.030   pH 8.0 5.0 - 8.0   Glucose, UA  NEGATIVE NEGATIVE mg/dL   Hgb urine dipstick NEGATIVE NEGATIVE   Bilirubin Urine NEGATIVE NEGATIVE   Ketones, ur NEGATIVE NEGATIVE mg/dL   Protein, ur NEGATIVE NEGATIVE mg/dL   Nitrite NEGATIVE NEGATIVE   Leukocytes,Ua NEGATIVE NEGATIVE  Wet prep, genital     Status: Abnormal   Collection Time: 10/20/19 12:29 PM  Result Value Ref Range   Yeast Wet Prep HPF POC NONE SEEN NONE SEEN   Trich, Wet Prep NONE SEEN NONE SEEN   Clue Cells Wet Prep HPF POC NONE SEEN NONE SEEN   WBC, Wet Prep HPF POC MODERATE (A) NONE SEEN   Sperm NONE SEEN     Assessment and Plan  Abdominal pain affecting pregnancy  - Information provided on abdominal pain in pregnancy - Advised to call [facility] ASAP to get scheduled for [facility] - Msg sent to [facility] Admin pool to get patient rescheduled for [facility]    - Patient given My Chart code at discharge to get set up on her phone  - Discharge patient - Patient verbalized an understanding of the plan of care and agrees.    Raelyn Mora, MSN, CNM 10/20/2019, 12:52 PM

## 2019-10-21 LAB — GC/CHLAMYDIA PROBE AMP (~~LOC~~) NOT AT ARMC
Chlamydia: NEGATIVE
Comment: NEGATIVE
Comment: NORMAL
Neisseria Gonorrhea: NEGATIVE

## 2019-10-23 ENCOUNTER — Ambulatory Visit: Payer: Medicaid Other | Admitting: *Deleted

## 2019-10-23 ENCOUNTER — Ambulatory Visit: Payer: Medicaid Other | Attending: Obstetrics

## 2019-10-23 ENCOUNTER — Ambulatory Visit: Payer: Medicaid Other

## 2019-10-23 ENCOUNTER — Other Ambulatory Visit: Payer: Self-pay

## 2019-10-23 VITALS — BP 100/64 | HR 91 | Wt 174.0 lb

## 2019-10-23 DIAGNOSIS — E669 Obesity, unspecified: Secondary | ICD-10-CM | POA: Insufficient documentation

## 2019-10-23 DIAGNOSIS — O4592 Premature separation of placenta, unspecified, second trimester: Secondary | ICD-10-CM

## 2019-10-23 DIAGNOSIS — Z3A20 20 weeks gestation of pregnancy: Secondary | ICD-10-CM

## 2019-10-23 DIAGNOSIS — O0990 Supervision of high risk pregnancy, unspecified, unspecified trimester: Secondary | ICD-10-CM | POA: Insufficient documentation

## 2019-10-23 DIAGNOSIS — O99212 Obesity complicating pregnancy, second trimester: Secondary | ICD-10-CM | POA: Diagnosis not present

## 2019-10-23 DIAGNOSIS — Z3490 Encounter for supervision of normal pregnancy, unspecified, unspecified trimester: Secondary | ICD-10-CM | POA: Insufficient documentation

## 2019-10-23 NOTE — Progress Notes (Signed)
[facility] drawn in [facility].

## 2019-10-27 ENCOUNTER — Other Ambulatory Visit: Payer: Self-pay

## 2019-10-27 ENCOUNTER — Telehealth: Payer: Self-pay | Admitting: Genetic Counselor

## 2019-10-27 NOTE — Telephone Encounter (Signed)
I called Ms. Matkins to discuss her negative noninvasive prenatal screening (NIPS)/cell free DNA (cfDNA) testing result. Specifically, Ms. Lofaso had NIPS through the laboratory [facility]. Testing was offered because of an echogenic intracardiac focus (EIF) identified on her anatomy ultrasound. These negative results demonstrated an expected representation of chromosome 21, 18, 13, and sex chromosome material, greatly reducing the likelihood of trisomies 98, 60, or 66 and sex chromosome aneuploidies for the pregnancy. The expected fetal sex was confirmed to be female. We discussed that the EIF is considered to be a normal variant in the context of these low risk NIPS results. Ms. Burgio was counseled that EIFs can be identified in babies who do not have a chromosomal aneuploidy such as Down syndrome.  NIPS analyzes placental DNA in maternal circulation. NIPS is considered to be highly specific and sensitive, but is not considered to be a diagnostic test. This testing identifies 91-99% of pregnancies with trisomies 21, 13, and 18, as well as sex chromosome aneuploidies, but does not test for all genetic conditions. Diagnostic testing via amniocentesis is available should Ms. Mand[place] be interested in confirming this result. She confirmed that she had no further questions at this time.  Gershon Crane, MS, [facility] Genetic Counselor

## 2019-11-04 ENCOUNTER — Encounter: Payer: Self-pay | Admitting: Advanced Practice Midwife

## 2019-11-04 ENCOUNTER — Other Ambulatory Visit: Payer: Self-pay

## 2019-11-04 ENCOUNTER — Ambulatory Visit (INDEPENDENT_AMBULATORY_CARE_PROVIDER_SITE_OTHER): Payer: Medicaid Other | Admitting: Advanced Practice Midwife

## 2019-11-04 VITALS — BP 101/68 | HR 93 | Wt 175.6 lb

## 2019-11-04 DIAGNOSIS — Z3A22 22 weeks gestation of pregnancy: Secondary | ICD-10-CM

## 2019-11-04 DIAGNOSIS — Z3492 Encounter for supervision of normal pregnancy, unspecified, second trimester: Secondary | ICD-10-CM

## 2019-11-04 DIAGNOSIS — Z3481 Encounter for supervision of other normal pregnancy, first trimester: Secondary | ICD-10-CM

## 2019-11-04 LAB — POCT URINALYSIS DIP (DEVICE)
Bilirubin Urine: NEGATIVE
Glucose, UA: NEGATIVE mg/dL
Hgb urine dipstick: NEGATIVE
Ketones, ur: NEGATIVE mg/dL
Nitrite: NEGATIVE
Protein, ur: NEGATIVE mg/dL
Specific Gravity, Urine: >=1.03 (ref 1.005–1.030)
Urobilinogen, UA: 0.2 mg/dL (ref 0.0–1.0)
pH: 7.5 (ref 5.0–8.0)

## 2019-11-04 NOTE — Patient Instructions (Signed)
[place] Food Resources  Department of Social Services-[place] [address], [place], [place] [address] [phone]   or  www.[place].gov/[place]/human-services/social-services **SNAP/EBT/ Other nutritional benefits  [place] DHHS-Public Health-WIC [address], [place], [place] [address] [phone]  or  https://[place].gov/[place]-county/human-services/health-department **WIC for  women who are pregnant and postpartum, infants and children up to 5 years old  [facility] [employer] [facility] [address], [place], [place] [address] [phone]   or   www.theblessedtable.org  **Food pantry  [facility] [employer] [address], [place], [place] [address] [phone]   or   https://brotherkolbes.godaddysites.com  **Emergency food and prepared meals  [place] [facility] Food Pantry [address], [place], [place] [address] [phone]   or   www.cedargrovetop.us **Food pantry  [facility] Food Pantry [address], [place], [place] [address] [phone]   or   www.facebook.com/Celia-Phelps-United-Methodist-Church-116430931718202 **Food pantry  [facility] Food Pantry [address], [place], [place] [address] [phone] **Food pantry  [place] [facility] [address], [place], [place] [address] [phone]   or   www.greensborourbanministry.org  **Food pantry and prepared meals  [facility] [address], [place], [place] [address] https://[email or web address]  **Food pantry  [place] [facility] Food [facility] [address], [place], [place] [address] [phone]   or   www.lbcnow.org  **Food pantry  [facility] [address], [place], [place] [address] [phone]   or   http://www.onestepfurther.com **Food pantry, nutrition education, gardening activities  [facility] [employer] Food Pantry [address], [place], [place] [address] [phone] **Food pantry  [facility] [place] [address], [place], [place] [address] [phone]   or   www.salvationarmyofgreensboro.org **Food pantry  [facility] of [place] [address], [place], [place] [address] [phone]   or   http://senior-resources-[place].org **Meals on Wheels Program  [place] [facility] [address], [place], [place] [address] [phone]   or   www.stmattchurch.com  **Food pantry  [place] [employer] Food [facility] [address], [place], [place] [address] [phone]   or   vandaliapresbyterianchurch.org **Food pantry     

## 2019-11-04 NOTE — Progress Notes (Signed)
  Subjective:   Jamie Neal is a 24 y.o. G2P1001 at [redacted]w[redacted]d by early ultrasound being seen today for her first obstetrical visit.  Her obstetrical history is significant for None. Patient does not intend to breast feed. Pregnancy history fully reviewed.  Patient reports no complaints.  HISTORY: OB History  Gravida Para Term Preterm AB Living  2 1 1  0 0 1  SAB TAB Ectopic Multiple Live Births  0 0 0 0 1    # Outcome Date GA Lbr Len/2nd Weight Sex Delivery Anes PTL Lv  2 Current           1 Term 10/15/16 [redacted]w[redacted]d 17:00 / 01:30 9 lb 5 oz (4.224 kg) F Vag-Spont EPI  LIV     Birth Comments: wnl     Name: [redacted]w[redacted]d    Last pap smear was done 05/2019 and was normal  History reviewed. No pertinent past medical history. Past Surgical History:  Procedure Laterality Date  . NO PAST SURGERIES     Family History  Problem Relation Age of Onset  . Hypertension Father    Social History   Tobacco Use  . Smoking status: Never Smoker  . Smokeless tobacco: Never Used  Vaping Use  . Vaping Use: Never used  Substance Use Topics  . Alcohol use: Not Currently    Comment: occasionally  . Drug use: Never   No Known Allergies Current Outpatient Medications on File Prior to Visit  Medication Sig Dispense Refill  . Prenatal MV-Min-FA-Omega-3 (PRENATAL GUMMIES/DHA & FA PO) Take 2 tablets by mouth daily.     No current facility-administered medications on file prior to visit.    Review of Systems Pertinent items noted in HPI and remainder of comprehensive ROS otherwise negative.  Exam   Vitals:   11/04/19 0901  BP: 101/68  Pulse: 93  Weight: 175 lb 9.6 oz (79.7 kg)   Fetal Heart Rate (bpm): 145  Physical Exam  Assessment:   Pregnancy: G2P1001 Patient Active Problem List   Diagnosis Date Noted  . Supervision of low-risk pregnancy 08/18/2019  . Obesity (BMI 30.0-34.9) 08/18/2019     Plan:  1. Encounter for supervision of low-risk pregnancy in second trimester -  CBC/D/Plt+RPR+Rh+ABO+Rub Ab... - Culture, OB Urine - AFP, Serum, Open Spina Bifida   Initial labs drawn. Continue prenatal vitamins. Genetic Screening discussed, AFP and NIPS: reviewed NIPS, AFP ordered . Ultrasound discussed; fetal anatomic survey: results reviewed. Problem list reviewed and updated. The nature of [facility] with multiple MDs and other Advanced Practice Providers was explained to patient; also emphasized that residents, students are part of our team. Routine obstetric precautions reviewed. 50% of 45 min visit spent in counseling and coordination of care. Return in about 6 weeks (around 12/16/2019) for In person visit for 28 week labs and GTT .  12/18/2019 DNP, CNM  11/04/19  10:20 AM

## 2019-11-05 LAB — CBC/D/PLT+RPR+RH+ABO+RUB AB...
Antibody Screen: NEGATIVE
Basophils Absolute: 0 10*3/uL (ref 0.0–0.2)
Basos: 0 %
EOS (ABSOLUTE): 0 10*3/uL (ref 0.0–0.4)
Eos: 0 %
HCV Ab: <0.1 s/co ratio (ref 0.0–0.9)
HIV Screen 4th Generation wRfx: NONREACTIVE
Hematocrit: 33.9 % — ABNORMAL LOW (ref 34.0–46.6)
Hemoglobin: 11.4 g/dL (ref 11.1–15.9)
Hepatitis B Surface Ag: NEGATIVE
Immature Grans (Abs): 0.1 10*3/uL (ref 0.0–0.1)
Immature Granulocytes: 1 %
Lymphocytes Absolute: 1.9 10*3/uL (ref 0.7–3.1)
Lymphs: 21 %
MCH: 30.9 pg (ref 26.6–33.0)
MCHC: 33.6 g/dL (ref 31.5–35.7)
MCV: 92 fL (ref 79–97)
Monocytes Absolute: 0.5 10*3/uL (ref 0.1–0.9)
Monocytes: 6 %
Neutrophils Absolute: 6.6 10*3/uL (ref 1.4–7.0)
Neutrophils: 72 %
Platelets: 189 10*3/uL (ref 150–450)
RBC: 3.69 x10E6/uL — ABNORMAL LOW (ref 3.77–5.28)
RDW: 13.1 % (ref 11.7–15.4)
RPR Ser Ql: NONREACTIVE
Rh Factor: POSITIVE
Rubella Antibodies, IGG: 2.52 index (ref >0.99)
WBC: 9.1 10*3/uL (ref 3.4–10.8)

## 2019-11-05 LAB — HCV INTERPRETATION

## 2019-11-06 LAB — AFP, SERUM, OPEN SPINA BIFIDA
AFP MoM: 1.02
AFP Value: 79.1 ng/mL
Gest. Age on Collection Date: 22.2 weeks
Maternal Age At EDD: 23.8 yr
OSBR Risk 1 IN: 10000
Test Results:: NEGATIVE
Weight: 176 [lb_av]

## 2019-11-08 LAB — URINE CULTURE, OB REFLEX

## 2019-11-08 LAB — CULTURE, OB URINE

## 2019-11-12 ENCOUNTER — Other Ambulatory Visit: Payer: Self-pay | Admitting: Advanced Practice Midwife

## 2019-11-12 MED ORDER — NITROFURANTOIN MONOHYD MACRO 100 MG PO CAPS
100.0000 mg | ORAL_CAPSULE | Freq: Two times a day (BID) | ORAL | 0 refills | Status: AC
Start: 2019-11-12 — End: 2019-11-19

## 2019-12-15 ENCOUNTER — Other Ambulatory Visit: Payer: Self-pay | Admitting: *Deleted

## 2019-12-15 DIAGNOSIS — Z3490 Encounter for supervision of normal pregnancy, unspecified, unspecified trimester: Secondary | ICD-10-CM

## 2019-12-18 ENCOUNTER — Other Ambulatory Visit: Payer: Medicaid Other

## 2019-12-18 ENCOUNTER — Encounter: Payer: Medicaid Other | Admitting: Medical

## 2019-12-18 ENCOUNTER — Encounter: Payer: Self-pay | Admitting: General Practice

## 2019-12-18 DIAGNOSIS — Z3492 Encounter for supervision of normal pregnancy, unspecified, second trimester: Secondary | ICD-10-CM

## 2019-12-24 ENCOUNTER — Encounter: Payer: Self-pay | Admitting: Family Medicine

## 2019-12-24 ENCOUNTER — Encounter: Payer: Medicaid Other | Admitting: Family Medicine

## 2019-12-24 ENCOUNTER — Other Ambulatory Visit: Payer: Medicaid Other

## 2019-12-25 ENCOUNTER — Ambulatory Visit (HOSPITAL_COMMUNITY)
Admission: EM | Admit: 2019-12-25 | Discharge: 2019-12-25 | Disposition: A | Discharge status: Home / Self Care | Payer: Medicaid Other

## 2019-12-25 ENCOUNTER — Other Ambulatory Visit: Payer: Self-pay

## 2019-12-25 NOTE — ED Notes (Signed)
Pt had left premises when called by phone for triage. Pt stated she would return tomorrow.\

## 2019-12-26 ENCOUNTER — Other Ambulatory Visit: Payer: Self-pay

## 2019-12-26 ENCOUNTER — Ambulatory Visit (HOSPITAL_COMMUNITY)
Admission: EM | Admit: 2019-12-26 | Discharge: 2019-12-26 | Disposition: A | Discharge status: Home / Self Care | Payer: Medicaid Other

## 2020-01-08 ENCOUNTER — Other Ambulatory Visit: Payer: Medicaid Other

## 2020-01-08 ENCOUNTER — Encounter: Payer: Medicaid Other | Admitting: Obstetrics & Gynecology

## 2020-01-27 ENCOUNTER — Encounter: Payer: Medicaid Other | Admitting: Nurse Practitioner

## 2020-01-27 ENCOUNTER — Other Ambulatory Visit: Payer: Medicaid Other

## 2020-02-02 ENCOUNTER — Other Ambulatory Visit: Payer: Medicaid Other

## 2020-02-02 ENCOUNTER — Encounter: Payer: Medicaid Other | Admitting: Obstetrics and Gynecology

## 2020-02-17 ENCOUNTER — Ambulatory Visit (INDEPENDENT_AMBULATORY_CARE_PROVIDER_SITE_OTHER): Payer: Medicaid Other | Admitting: Medical

## 2020-02-17 ENCOUNTER — Other Ambulatory Visit: Payer: Self-pay

## 2020-02-17 ENCOUNTER — Other Ambulatory Visit (HOSPITAL_COMMUNITY)
Admission: RE | Admit: 2020-02-17 | Discharge: 2020-02-17 | Disposition: A | Discharge status: Home / Self Care | Payer: Medicaid Other | Source: Ambulatory Visit | Attending: Medical | Admitting: Medical

## 2020-02-17 ENCOUNTER — Encounter: Payer: Self-pay | Admitting: Medical

## 2020-02-17 ENCOUNTER — Other Ambulatory Visit: Payer: Medicaid Other

## 2020-02-17 VITALS — BP 101/60 | HR 96 | Wt 190.8 lb

## 2020-02-17 DIAGNOSIS — O0933 Supervision of pregnancy with insufficient antenatal care, third trimester: Secondary | ICD-10-CM

## 2020-02-17 DIAGNOSIS — Z3493 Encounter for supervision of normal pregnancy, unspecified, third trimester: Secondary | ICD-10-CM | POA: Insufficient documentation

## 2020-02-17 DIAGNOSIS — Z3490 Encounter for supervision of normal pregnancy, unspecified, unspecified trimester: Secondary | ICD-10-CM

## 2020-02-17 DIAGNOSIS — E669 Obesity, unspecified: Secondary | ICD-10-CM

## 2020-02-17 NOTE — Progress Notes (Signed)
   PRENATAL VISIT NOTE  Subjective:  Jamie Neal is a 24 y.o. G2P1001 at [redacted]w[redacted]d being seen today for ongoing prenatal care.  She is currently monitored for the following issues for this low-risk pregnancy and has Supervision of low-risk pregnancy; Obesity (BMI 30.0-34.9); Late prenatal care affecting pregnancy, antepartum, third trimester; and Insufficient prenatal care in third trimester on their problem list.  Patient reports occasional contractions.  Contractions: Irritability. Vag. Bleeding: None.  Movement: Present. Denies leaking of fluid.   The following portions of the patient's history were reviewed and updated as appropriate: allergies, current medications, past family history, past medical history, past social history, past surgical history and problem list.   Objective:   Vitals:   02/17/20 0954  BP: 101/60  Pulse: 96  Weight: 190 lb 12.8 oz (86.5 kg)    Fetal Status: Fetal Heart Rate (bpm): 138   Movement: Present  Presentation: Vertex  General:  Alert, oriented and cooperative. Patient is in no acute distress.  Skin: Skin is warm and dry. No rash noted.   Cardiovascular: Normal heart rate noted  Respiratory: Normal respiratory effort, no problems with respiration noted  Abdomen: Soft, gravid, appropriate for gestational age.  Pain/Pressure: Present     Pelvic: Cervical exam performed in the presence of a chaperone Dilation: Closed Effacement (%): 50 Station: -2  Extremities: Normal range of motion.  Edema: None  Mental Status: Normal mood and affect. Normal behavior. Normal judgment and thought content.   Assessment and Plan:  Pregnancy: G2P1001 at [redacted]w[redacted]d 1. Encounter for supervision of low-risk pregnancy in third trimester - GC/Chlamydia probe amp ([place])not at [facility] - Culture, beta strep (group b only) - 2 hour GTT, CBC, HIV and RPR today  - Discussed TDap, wants to review and consider at next visit  - Peds list given and discussed importance of choosing  Peds ASAP  - Declined flu vaccine today  2. Late prenatal care affecting pregnancy, antepartum, third trimester - First visit at 22 weeks  3. Insufficient prenatal care in third trimester -  Only second provider visit today for this pregnancy   4. Obesity (BMI 30.0-34.9) - Discussed weight gain goals  Term labor symptoms and general obstetric precautions including but not limited to vaginal bleeding, contractions, leaking of fluid and fetal movement were reviewed in detail with the patient. Please refer to After Visit Summary for other counseling recommendations.   Return in about 1 week (around 02/24/2020) for LOB, In-Person, any provider.  Future Appointments  Date Time Provider Department Center  02/25/2020 10:15 AM Currie Paris, NP [facility]    Vonzella Nipple, PA-C

## 2020-02-17 NOTE — Patient Instructions (Addendum)
Fetal Movement Counts Patient Name: ________________________________________________ Patient Due Date: ____________________ What is a fetal movement count?  A fetal movement count is the number of times that you feel your baby move during a certain amount of time. This may also be called a fetal kick count. A fetal movement count is recommended for every pregnant woman. You may be asked to start counting fetal movements as early as week 28 of your pregnancy. Pay attention to when your baby is most active. You may notice your baby's sleep and wake cycles. You may also notice things that make your baby move more. You should do a fetal movement count:  When your baby is normally most active.  At the same time each day. A good time to count movements is while you are resting, after having something to eat and drink. How do I count fetal movements? 1. Find a quiet, comfortable area. Sit, or lie down on your side. 2. Write down the date, the start time and stop time, and the number of movements that you felt between those two times. Take this information with you to your health care visits. 3. Write down your start time when you feel the first movement. 4. Count kicks, flutters, swishes, rolls, and jabs. You should feel at least 10 movements. 5. You may stop counting after you have felt 10 movements, or if you have been counting for 2 hours. Write down the stop time. 6. If you do not feel 10 movements in 2 hours, contact your health care provider for further instructions. Your health care provider may want to do additional tests to assess your baby's well-being. Contact a health care provider if:  You feel fewer than 10 movements in 2 hours.  Your baby is not moving like he or she usually does. Date: ____________ Start time: ____________ Stop time: ____________ Movements: ____________ Date: ____________ Start time: ____________ Stop time: ____________ Movements: ____________ Date: ____________  Start time: ____________ Stop time: ____________ Movements: ____________ Date: ____________ Start time: ____________ Stop time: ____________ Movements: ____________ Date: ____________ Start time: ____________ Stop time: ____________ Movements: ____________ Date: ____________ Start time: ____________ Stop time: ____________ Movements: ____________ Date: ____________ Start time: ____________ Stop time: ____________ Movements: ____________ Date: ____________ Start time: ____________ Stop time: ____________ Movements: ____________ Date: ____________ Start time: ____________ Stop time: ____________ Movements: ____________ This information is not intended to replace advice given to you by your health care provider. Make sure you discuss any questions you have with your health care provider. Document Revised: 12/05/2018 Document Reviewed: 12/05/2018 Elsevier Patient Education  2020 Elsevier Inc. Braxton Hicks Contractions Contractions of the uterus can occur throughout pregnancy, but they are not always a sign that you are in labor. You may have practice contractions called Braxton Hicks contractions. These false labor contractions are sometimes confused with true labor. What are Braxton Hicks contractions? Braxton Hicks contractions are tightening movements that occur in the muscles of the uterus before labor. Unlike true labor contractions, these contractions do not result in opening (dilation) and thinning of the cervix. Toward the end of pregnancy (32-34 weeks), Braxton Hicks contractions can happen more often and may become stronger. These contractions are sometimes difficult to tell apart from true labor because they can be very uncomfortable. You should not feel embarrassed if you go to the hospital with false labor. Sometimes, the only way to tell if you are in true labor is for your health care provider to look for changes in the cervix. The health care provider   will do a physical exam and may  monitor your contractions. If you are not in true labor, the exam should show that your cervix is not dilating and your water has not broken. If there are no other health problems associated with your pregnancy, it is completely safe for you to be sent home with false labor. You may continue to have Braxton Hicks contractions until you go into true labor. How to tell the difference between true labor and false labor True labor  Contractions last 30-70 seconds.  Contractions become very regular.  Discomfort is usually felt in the top of the uterus, and it spreads to the lower abdomen and low back.  Contractions do not go away with walking.  Contractions usually become more intense and increase in frequency.  The cervix dilates and gets thinner. False labor  Contractions are usually shorter and not as strong as true labor contractions.  Contractions are usually irregular.  Contractions are often felt in the front of the lower abdomen and in the groin.  Contractions may go away when you walk around or change positions while lying down.  Contractions get weaker and are shorter-lasting as time goes on.  The cervix usually does not dilate or become thin. Follow these instructions at home:   Take over-the-counter and prescription medicines only as told by your health care provider.  Keep up with your usual exercises and follow other instructions from your health care provider.  Eat and drink lightly if you think you are going into labor.  If Braxton Hicks contractions are making you uncomfortable: ? Change your position from lying down or resting to walking, or change from walking to resting. ? Sit and rest in a tub of warm water. ? Drink enough fluid to keep your urine pale yellow. Dehydration may cause these contractions. ? Do slow and deep breathing several times an hour.  Keep all follow-up prenatal visits as told by your health care provider. This is important. Contact a  health care provider if:  You have a fever.  You have continuous pain in your abdomen. Get help right away if:  Your contractions become stronger, more regular, and closer together.  You have fluid leaking or gushing from your vagina.  You pass blood-tinged mucus (bloody show).  You have bleeding from your vagina.  You have low back pain that you never had before.  You feel your baby's head pushing down and causing pelvic pressure.  Your baby is not moving inside you as much as it used to. Summary  Contractions that occur before labor are called Braxton Hicks contractions, false labor, or practice contractions.  Braxton Hicks contractions are usually shorter, weaker, farther apart, and less regular than true labor contractions. True labor contractions usually become progressively stronger and regular, and they become more frequent.  Manage discomfort from [facility] contractions by changing position, resting in a warm bath, drinking plenty of water, or practicing deep breathing. This information is not intended to replace advice given to you by your health care provider. Make sure you discuss any questions you have with your health care provider. Document Revised: 03/30/2017 Document Reviewed: 08/31/2016 Elsevier Patient Education  2020 [employer].    [email or web address] - to register for delivery and birth certificate     AREA PEDIATRIC/FAMILY PRACTICE PHYSICIANS  [place]outheast [place] ([address]) . [facility] Melodie Bouillon, MD; Lum Babe, MD; Sheffield Slider, MD; Leveda Anna, MD; McDiarmid, MD; Jerene Bears, MD; Jennette Kettle, MD; Gwendolyn Grant, MD o [address] [place]  [place], [place] [address] o [phone] o Mon-Fri 8:30-12:30, 1:30-5:00 o Providers come to see babies at [facility] o Accepting Medicaid . [facility] [place] o Limited providers who accept newborns: Docia Chuck, MD; Kateri Plummer, MD; Paulino Rily, MD o [address],  [place], [place] [address] o [phone] o Mon-Fri 8:00-5:30 o Babies seen by providers at [facility] o Does NOT accept Medicaid o Please call early in hospitalization for appointment (limited availability)  . [facility] Fatima Sanger, MD o [address], [place], [place] [address] o [phone] o Mon, Tue, Thur, Fri 8:30-5:00, Wed 10:00-7:00 (closed 1-2pm) o Babies seen by [facility] providers o Accepting Medicaid . Donnie Coffin - Pediatrician Fae Pippin, MD o [address], [place], [place] [address] o [phone] o Mon-Fri 8:30-5:00, Sat 8:30-12:00 o Provider comes to see babies at [facility] o Accepting Medicaid o Must have been referred from current patients or contacted office prior to delivery . [facility]) Leotis Pain, MD; Ave Filter, MD; Luna Fuse, MD; Kennedy Bucker, MD; Konrad Dolores, MD; Kathlene November, MD; Jenne Campus, MD; Lubertha South, MD; Wynetta Emery, MD; Duffy Rhody, MD; Gerre Couch, NP; Shirl Harris, NP o [address] [place], [place], [place] [address] o [phone] o Mon, Tue, Thur, Fri 8:30-5:30, Wed 9:30-5:30, Sat 8:30-12:30 o Babies seen by [facility] providers o Accepting Medicaid o Only accepting infants of first-time parents or siblings of current patients [facility] discharge coordinator will make follow-up appointment . Cyril Mourning o [address] [place], [place]  [address] o [phone]   Fax - [phone] . [facility] o [address], [place], [place]  [address] o Phone - [phone]   Fax - [phone] . Lucio Edward o [address], [place], [place]  [address] o [phone]  [place] [phone]) . [facility] Jorge Mandril, MD; Alita Chyle, MD; Princella Ion, MD; MD; Earlene Plater, MD; Jamesetta Orleans, MD; Alvera Novel, MD; Clarene Duke, MD; Rana Snare, MD; Carmon Ginsberg, MD; Alinda Money, MD; Hosie Poisson, MD; Mayford Knife, MD o [address] [place], [place]  [address] o [phone] o Mon-Fri 8:30-5:00 (extended evenings Mon-Thur as needed), Sat-Sun 10:00-1:00 o Providers come to see babies at [facility] o Accepting Medicaid for families of first-time babies and families with all children in the household age 74 and under. Must register with office prior to making appointment (M-F only). [facility] Odella Aquas, NP; Lynelle Doctor, MD; Susann Givens, MD; [place], [place] o [address], [place], [place] [address] o [phone] o Mon-Fri 8:00-5:00 o Babies seen by providers at [facility] o Does NOT accept Medicaid/Commercial Insurance Only . [facility] [place] ([facility])  Suzette Battiest, MD; Zachery Dauer, MD; Stefan Church, MD; Sabino Dick, MD; [place] Livings, MD; Farris Has, MD; Gaynell Face, MD; Betha Loa, MD; Colon Flattery, MD; Clifton James, MD o [address] [place] [place], [place] [address] o [phone] o Mon-Fri 8:30-5:30, Sat (Oct.-Mar.) 9:00-1:00 o Babies seen by providers at [facility] o Accepting [facility] [phone]) . [facility] Dimitri, MD; Sheliah Hatch, MD o [address], [place], [place] [address] o [phone] o Mon-Fri 8:30-5:00, Sat 8:30-12:00 o Providers come to see babies at [facility] o Does NOT accept Medicaid . [facility] Cindy Hazy, [place]; Tracie Harrier, MD; [place], [place]; Wynelle Link, MD; Azucena Cecil, MD o [address] [place], [place] [address] o [phone] o Mon-Fri 8:00-5:00 o Babies seen by providers at [facility] o Does  NOT accept Medicaid o Only accepting babies of parents who are patients o Please call early in hospitalization for appointment (limited availability) . [facility] o Clark, MD; Frye, MD; Kelleher, MD; Mack, NP; Miller, MD; O'Keller, MD; [place], NP; Pudlo, MD; Puzio, MD; Thomas, MD; Tucker, MD; Twiselton, MD o [address], [place], [place] [address] o [phone] o Mon-Fri 8:00-5:00,  Sat 9:00-12:00 o Providers come to see babies at [facility] o Does NOT accept Medicaid  [place] ([address]) . [facility] [facility] o Limited providers accepting new patients: Brake, NP; Wharton, PA o [address], [place], [place] [address] o [phone] o Mon-Fri 8:00-5:00 o Babies seen by providers at [facility] o Does NOT accept Medicaid o Only accepting babies of parents who are patients o Please call early in hospitalization for appointment (limited availability) . [facility] o Gay, MD; Quinlan, MD o [address] [place], [place] [address] o [phone] (press 1 to schedule appointment) o Mon-Fri 8:00-5:00 o Providers come to see babies at [facility] o Does NOT accept Medicaid . [facility] o Mazer, MD o [address] [place], [place] [address] o [phone] o Mon-Fri 8:30-5:00 (lunch 12:30-1:00), extended hours by appointment only Wed 5:00-6:30 o Babies seen by [facility] providers o Accepting Medicaid . [facility] o Banks, MD; Jordan, MD; Koberlein, MD o [address], [place], [place] [address] o [phone] o Mon-Fri 8:00-5:00 o Babies seen by [facility] providers o Does NOT accept Medicaid .  [facility] o Parker, MD; Hunter, MD; Wallace, DO o [address] [place], [place] [address] o [phone] o Mon-Fri 8:00-5:00 o Babies seen by [facility] providers o Does NOT accept Medicaid . [facility] o Brandon, PA; Brecken, PA; Christy, NP; Dees, MD; DeClaire, MD; DeWeese, MD; Hansen, NP; Mills, NP; Parrish, NP; Smoot, NP; Summer, MD; Vapne, MD o [address] [place], [place] [address] o [phone] o Mon-Fri 8:30-5:00, Sat 10:00-1:00 o Providers come to see babies at [facility] o Does NOT accept Medicaid o Free prenatal information session Tuesdays at 4:45pm . [facility] o Bouska, MD; Gordon, PA; Jeffery, PA; Weber, PA o [address] [place] [address] o [phone] o Mon-Fri 7:30-5:30 o Babies seen by [facility] providers . [facility] o [address], [place], [place]  [address] o [phone]   Fax - [phone]  [place] ([phone] & [phone]) . [facility] o [place], [place] o [address] [place], [place] [address] o [phone] o Mon-Thur 8:00-6:00 o Providers come to see babies at [facility] o Accepting Medicaid . [facility] o Anderson, NP; Badger, MD; Beal, PA; Spencer, PA o [address] [place], [place] [address] o [phone] o Mon-Thur 7:30-7:30, Fri 7:30-4:30 o Babies seen by [facility] providers o Accepting Medicaid . [facility] o Agbuya, MD; Klett, NP; Romgoolam, MD o [address], [place], [place] [address] o [phone] o Mon-Fri 8:30-5:00, Sat 8:30-12:00 o Providers come to see babies at [facility] o Accepting Medicaid o Must have "Meet & Greet" appointment at office prior to delivery . [facility] o McCord, MD; Wallace, MD; Wood, MD o [address], [place], [place] [address] o [phone] o Mon-Wed 8:00-6:00, Thur-Fri 8:00-5:00, Sat 9:00-12:00 o Providers come to see babies at [facility] o Does   NOT accept Medicaid o Only accepting siblings of current patients . [facility]  o [address], [place], [place]  [address] o [phone]   Fax - [phone] . [facility] [place] o [address], [place], [place]  [address] o [phone]   Fax - [phone]  [place] ([phone]) . [facility] o Cirigliano, DO; Matthews, DO o [address] [place], [place]  [address] o [phone] o Mon-Fri 7:00-5:00 o Babies seen by [facility] providers o Does NOT accept Medicaid . [facility] o Briscoe, MD; Howley, PA; Moreira, PA o [address], [place], [place] [address] o [phone] o Mon-Fri 8:00-5:00 o Babies seen by [facility] providers o Accepting Medicaid . [facility] o Boyd, MD; Church, PA; Jones, NP; Osborn, PA o [address], , [place] [address] o [phone] o Mon-Fri 8:00-5:00 o Babies seen by providers at [facility] o Accepting Medicaid  [place] ([address]) . [facility] [place] o Wendling, DO o [address] [place], [place] [address] o [phone] o Mon-Fri 8:00-5:00 o Babies seen by [facility] providers o Does NOT accept Medicaid o Limited availability, please call early in hospitalization to schedule follow-up . [facility] o Calderon, PA; Cummings, MD; Dillard, MD; Martin, PA; Olson, MD; VanDeven, PA o [address], [place], [place] [address] o [phone] o Mon-Fri 8:30-5:00, Sat 9:00-12:00 o Babies seen by providers at [facility] o Accepting Medicaid o Please register online then schedule online or call office o [email or web address] . [facility] o Hunter, NP; Kumar, MD; [place] Rogers, PA o [address], [place], [place] [address] o [phone] o Mon-Fri 8:00-5:00 o Babies seen by providers at [facility] o Accepting Medicaid . [facility] o Rawlings, MD; Kristi Fleenor, NP; West, MD o [address], [place], [place] [address] o [phone] o Mon-Fri 8:00-5:30, Sat&Sun by appointment (phones open at 8:30) o Babies seen by [facility] providers o Accepting Medicaid o Must be a  first-time baby or sibling of current patient . [facility] [place]  o [address], [place], [place]  [address] o [phone]   Fax - [phone]  [place] ([address]) . [facility] o Brown, PA; Cowen, PA; Rice, MD; Helton, PA; Spry, MD o [address] [place], [place] [address] o [phone] o Mon-Thur 8:00-7:00, Fri 8:00-5:00, Sat 8:00-12:00, Sun 9:00-12:00 o Babies seen by [facility] providers o Accepting Medicaid . [facility] [place] o Coe-Goins, MD; Marshall, MD; Pierre-Louis, MD o [address] [place], [place],  [address] o [phone] o Mon-Thur 8:00-5:00 o Babies seen by providers at [facility] o Accepting Medicaid . [facility] [place] o Bratton, MD; Coe-Goins, MD; Hayes, MD; Lewis, MD; List, MD; Lott, MD; Marshall, MD; [place], MD; O'Neal, MD; Pierre-Louis, MD; Pitonzo, MD; Scholer, MD; Spangle, MD o [address] [place],  [address] o [phone] o Mon-Fri 8:00-5:30, Sat (Oct.-Mar.) 9:00-1:00 o Babies seen by providers at [facility] o Accepting Medicaid o Must fill out new patient packet, available online at [email or web address] . [facility]   [place] [facility]) Simone Curia, NP; Tiburcio Pea, NP; Tresa Endo, NP; Whitney Post, MD; [place], [place]; Hennie Duos, MD; Wynne Dust, MD; Kavin Leech, NP o [address], [place], [place] [address] o [phone] o Mon-Thur 8:00-5:30, Fri 8:00-5:00 o Babies seen by providers at [facility] o Accepting [facility] [phone]) . [facility] o [place], [place]; Folsom, MD; Tanya Nones, MD; [place], [place] o [address] [place], [place] [address] o [phone] o Mon-Fri 8:00-5:00 o Babies seen by providers at [facility] o Accepting [facility] [phone]) . [facility] o [place], DO; Lenise Arena, MD; [place], [place] o [address], [place], [place] [address] o [phone] o Mon-Fri 8:00-5:00 o Babies seen by providers at [facility] o Does NOT accept Medicaid o Limited appointment availability, please call early in hospitalization  . Nature conservation officer at [facility] o Brillion, DO; [place], MD o [address], [place], [place] [address] o [phone] o Mon-Fri 8:00-5:00 o Babies seen by [facility] providers o Does NOT accept Medicaid . [facility] Lorrine Kin, MD; Ninetta Lights, MD; [place], [place]; [place], MD o [address], [place], [place] [address] o [phone] o Mon-Fri 8:00-5:00 o After hours clinic [facility] [place], [place] [address]) [phone] Mon-Fri 5:00-8:00, Sat 12:00-6:00, Sun 10:00-4:00 o Babies seen by [facility] providers o Accepting Medicaid . [facility] o [address], [place], [place]  [address] o [phone]   Fax - [phone]  [place] [phone]) . Nature conservation officer at [facility], MD o [address] [place], [place], [place] [address] o [phone] o Mon-Fri 8:00-5:00 o Babies seen by [facility] providers o Does NOT accept Medicaid . [facility] [place] [facility] [place]) Tomi Likens, MD o [address] [place] [address], [place], [place] [address] o [phone] o Mon-Thur 8:00-7:00, Fri 8:00-5:00, Sat 8:00-12:00 o Babies seen by providers at [facility] o Accepting Medicaid - but does not have vaccinations in office (must be received elsewhere) o Limited availability, please call early in hospitalization  [place] ([address]) . [facility]  o Wyvonne Lenz, MD o [address], [place] [address] o [phone]  Fax [phone]    TDaP Vaccine Pregnancy Get the Whooping Cough Vaccine While You Are Pregnant (CDC)  It is important for women  to get the whooping cough vaccine in the third trimester of each pregnancy. Vaccines are the best way to prevent this disease. There are 2 different whooping cough vaccines. Both vaccines combine protection against whooping cough, tetanus and diphtheria, but they are for different age groups: Tdap: for everyone 11 years or older, including pregnant women  DTaP: for children 2 months through 71 years of age  You need the whooping cough vaccine during each of your pregnancies The recommended time to get the shot is during your 27th through 36th week of pregnancy, preferably during the earlier part of this time period. The Centers for Disease Control and Prevention (CDC) recommends that pregnant women receive the whooping cough vaccine for adolescents and adults (called Tdap vaccine) during the third trimester of each pregnancy. The recommended time to get the shot is during your 27th through 36th week of pregnancy, preferably during the earlier part of this time period. This replaces the original recommendation that pregnant women get the vaccine only if they had not previously  received it. The [employer] of Obstetricians and Gynecologists and the [employer] support this recommendation.  You should get the whooping cough vaccine while pregnant to pass protection to your baby frame support disabled and/or not supported in this browser  Learn why Vernona Rieger decided to get the whooping cough vaccine in her 3rd trimester of pregnancy and how her baby girl was born with some protection against the disease. Also available on YouTube. After receiving the whooping cough vaccine, your body will create protective antibodies (proteins produced by the body to fight off diseases) and pass some of them to your baby before birth. These antibodies provide your baby some short-term protection against whooping cough in early life. These antibodies can also protect your baby from some of the more  serious complications that come along with whooping cough. Your protective antibodies are at their highest about 2 weeks after getting the vaccine, but it takes time to pass them to your baby. So the preferred time to get the whooping cough vaccine is early in your third trimester. The amount of whooping cough antibodies in your body decreases over time. That is why CDC recommends you get a whooping cough vaccine during each pregnancy. Doing so allows each of your babies to get the greatest number of protective antibodies from you. This means each of your babies will get the best protection possible against this disease.  Getting the whooping cough vaccine while pregnant is better than getting the vaccine after you give birth Whooping cough vaccination during pregnancy is ideal so your baby will have short-term protection as soon as he is born. This early protection is important because your baby will not start getting his whooping cough vaccines until he is 2 months old. These first few months of life are when your baby is at greatest risk for catching whooping cough. This is also when he's at greatest risk for having severe, potentially life-threating complications from the infection. To avoid that gap in protection, it is best to get a whooping cough vaccine during pregnancy. You will then pass protection to your baby before he is born. To continue protecting your baby, he should get whooping cough vaccines starting at 2 months old. You may never have gotten the Tdap vaccine before and did not get it during this pregnancy. If so, you should make sure to get the vaccine immediately after you give birth, before leaving the hospital or birthing center. It will take about 2 weeks before your body develops protection (antibodies) in response to the vaccine. Once you have protection from the vaccine, you are less likely to give whooping cough to your newborn while caring for him. But remember, your baby will  still be at risk for catching whooping cough from others. A recent study looked to see how effective Tdap was at preventing whooping cough in babies whose mothers got the vaccine while pregnant or in the hospital after giving birth. The study found that getting Tdap between 27 through 36 weeks of pregnancy is 85% more effective at preventing whooping cough in babies younger than 2 months old. Blood tests cannot tell if you need a whooping cough vaccine There are no blood tests that can tell you if you have enough antibodies in your body to protect yourself or your baby against whooping cough. Even if you have been sick with whooping cough in the past or previously received the vaccine, you still should get the vaccine during each pregnancy. Breastfeeding may pass some  protective antibodies onto your baby By breastfeeding, you may pass some antibodies you have made in response to the vaccine to your baby. When you get a whooping cough vaccine during your pregnancy, you will have antibodies in your breast milk that you can share with your baby as soon as your milk comes in. However, your baby will not get protective antibodies immediately if you wait to get the whooping cough vaccine until after delivering your baby. This is because it takes about 2 weeks for your body to create antibodies. Learn more about the health benefits of breastfeeding.

## 2020-02-18 LAB — GC/CHLAMYDIA PROBE AMP (~~LOC~~) NOT AT ARMC
Chlamydia: NEGATIVE
Comment: NEGATIVE
Comment: NORMAL
Neisseria Gonorrhea: NEGATIVE

## 2020-02-18 LAB — CBC
Hematocrit: 31.5 % — ABNORMAL LOW (ref 34.0–46.6)
Hemoglobin: 10.5 g/dL — ABNORMAL LOW (ref 11.1–15.9)
MCH: 28.2 pg (ref 26.6–33.0)
MCHC: 33.3 g/dL (ref 31.5–35.7)
MCV: 85 fL (ref 79–97)
Platelets: 191 10*3/uL (ref 150–450)
RBC: 3.73 x10E6/uL — ABNORMAL LOW (ref 3.77–5.28)
RDW: 13.4 % (ref 11.7–15.4)
WBC: 7.4 10*3/uL (ref 3.4–10.8)

## 2020-02-18 LAB — GLUCOSE TOLERANCE, 2 HOURS W/ 1HR
Glucose, 1 hour: 113 mg/dL (ref 65–179)
Glucose, 2 hour: 108 mg/dL (ref 65–152)
Glucose, Fasting: 65 mg/dL (ref 65–91)

## 2020-02-18 LAB — RPR: RPR Ser Ql: NONREACTIVE

## 2020-02-18 LAB — HIV ANTIBODY (ROUTINE TESTING W REFLEX): HIV Screen 4th Generation wRfx: NONREACTIVE

## 2020-02-21 LAB — CULTURE, BETA STREP (GROUP B ONLY): Strep Gp B Culture: NEGATIVE

## 2020-02-25 ENCOUNTER — Ambulatory Visit (INDEPENDENT_AMBULATORY_CARE_PROVIDER_SITE_OTHER): Payer: Medicaid Other | Admitting: Nurse Practitioner

## 2020-02-25 ENCOUNTER — Other Ambulatory Visit: Payer: Self-pay

## 2020-02-25 VITALS — BP 115/67 | HR 84 | Wt 190.0 lb

## 2020-02-25 DIAGNOSIS — Z3493 Encounter for supervision of normal pregnancy, unspecified, third trimester: Secondary | ICD-10-CM

## 2020-02-25 DIAGNOSIS — B009 Herpesviral infection, unspecified: Secondary | ICD-10-CM | POA: Insufficient documentation

## 2020-02-25 DIAGNOSIS — Z3A38 38 weeks gestation of pregnancy: Secondary | ICD-10-CM

## 2020-02-25 MED ORDER — VALACYCLOVIR HCL 500 MG PO TABS
500.0000 mg | ORAL_TABLET | Freq: Two times a day (BID) | ORAL | 0 refills | Status: DC
Start: 2020-02-25 — End: 2020-03-03

## 2020-02-25 NOTE — Progress Notes (Signed)
    Subjective:  Jamie Neal is a 24 y.o. G2P1001 at [redacted]w[redacted]d being seen today for ongoing prenatal care.  She is currently monitored for the following issues for this low-risk pregnancy and has Supervision of low-risk pregnancy; Obesity (BMI 30.0-34.9); Late prenatal care affecting pregnancy, antepartum, third trimester; Insufficient prenatal care in third trimester; and Herpes infection on their problem list.  Patient reports no complaints.  Contractions: Irritability. Vag. Bleeding: None.  Movement: Present. Denies leaking of fluid.   The following portions of the patient's history were reviewed and updated as appropriate: allergies, current medications, past family history, past medical history, past social history, past surgical history and problem list. Problem list updated.  Objective:   Vitals:   02/25/20 1032  BP: 115/67  Pulse: 84  Weight: 190 lb (86.2 kg)    Fetal Status: Fetal Heart Rate (bpm): 144 Fundal Height: 38 cm Movement: Present     General:  Alert, oriented and cooperative. Patient is in no acute distress.  Skin: Skin is warm and dry. No rash noted.   Cardiovascular: Normal heart rate noted  Respiratory: Normal respiratory effort, no problems with respiration noted  Abdomen: Soft, gravid, appropriate for gestational age. Pain/Pressure: Present     Pelvic:  Cervical exam deferred        Extremities: Normal range of motion.  Edema: None  Mental Status: Normal mood and affect. Normal behavior. Normal judgment and thought content.   Urinalysis:      Assessment and Plan:  Pregnancy: G2P1001 at [redacted]w[redacted]d  1. Encounter for supervision of low-risk pregnancy in third trimester Reviewed pediatrician and client to call [facility] (has moved to the area during this pregnancy) Reviewed that induction would be scheduled at next visit and cervical exam would be done. Reviewed contractions with client.  2. Herpes infection Has history of HSV - reviewed her  patient portal from her previous provider - not identified as type one or two.  (Was not listed on her chart here.)  Client aware she needs to take Valtrex - prescription sent and advised to begin today.  3. [redacted] weeks gestation of pregnancy   Term labor symptoms and general obstetric precautions including but not limited to vaginal bleeding, contractions, leaking of fluid and fetal movement were reviewed in detail with the patient. Please refer to After Visit Summary for other counseling recommendations.  Return in about 1 week (around 03/03/2020) for in person ROB.  Nolene Bernheim, RN, MSN, NP-BC Nurse Practitioner, [facility] for [employer], [facility] 02/25/2020 1:00 PM

## 2020-02-25 NOTE — Patient Instructions (Addendum)
[facility]  AREA PEDIATRIC/FAMILY PRACTICE PHYSICIANS  Central/Southeast  [place] [phone] . [facility] Melodie Bouillono Chambliss, MD; Lum BabeEniola, MD; Sheffield SliderHale, MD; Leveda AnnaHensel, MD; McDiarmid, MD; Jerene BearsMcIntyer, MD; Jennette KettleNeal, MD; Gwendolyn GrantWalden, MD o [address] [place], [place] [address] o [phone] o Mon-Fri 8:30-12:30, 1:30-5:00 o Providers come to see babies at [facility] o Accepting Medicaid . [facility] o Limited providers who accept newborns: Docia ChuckKoirala, MD; Kateri PlummerMorrow, MD; Paulino RilyWolters, MD o [address], [place], [place] [address] o [phone] o Mon-Fri 8:00-5:30 o Babies seen by providers at [facility] o Does NOT accept Medicaid o Please call early in hospitalization for appointment (limited availability)  . [facility] Fatima Sangero Mulberry, MD o [address] [place], [place] [address] o [phone] o Mon, Tue, Thur, Fri 8:30-5:00, Wed 10:00-7:00 (closed 1-2pm) o Babies seen by [facility] providers o Accepting Medicaid . Donnie Coffinubin - Pediatrician Fae Pippino Rubin, MD o [address], [place], [place] [address] o [phone] o Mon-Fri 8:30-5:00, Sat 8:30-12:00 o Provider comes to see babies at [facility] o Accepting Medicaid o Must have been referred from current patients or contacted office prior to delivery . [facility] Leotis Paino Brown, MD; Ave Filterhandler, MD; Luna FuseEttefagh, MD; Kennedy BuckerGrant, MD; Konrad DoloresLester, MD; Kathlene NovemberMcCormick, MD; Jenne CampusMcQueen, MD; Lubertha SouthProse, MD; Wynetta EmerySimha, MD; Duffy RhodyStanley, MD; Gerre CouchStryffeler, NP; Shirl Harrisebben, NP o [address], [place], [place] [address] o [phone] o Mon, Tue, Thur, Fri 8:30-5:30, Wed 9:30-5:30, Sat 8:30-12:30 o Babies seen by [facility] providers o Accepting Medicaid o Only accepting infants of first-time parents or siblings of current patients [facility] discharge coordinator will make follow-up appointment . Cyril MourningJack Amos o [address], [place], [place]  [address] o [phone]   Fax - [phone] . [facility] o [address], [place], [place]  [address] o Phone - [phone]   Fax - [phone] . Lucio EdwardShilpa Gosrani o [address], [place], [place]  [address] o [phone]  East/[place]ortheast [place] [phone]) . [facility] Jorge Mandrilo Bates, MD; Alita ChyleBrassfield, MD; Princella Ionooper, Cox, MD; MD; Earlene Plateravis, MD; Jamesetta Orleansovico, MD; Alvera NovelEttefaugh, MD; Clarene DukeLittle, MD; Rana SnareLowe, MD; Carmon GinsbergKeiffer, MD; Alinda MoneyMelvin, MD; Hosie PoissonSumner, MD; Mayford KnifeWilliams, MD o [address], [place], [place] [address] o [phone] o Mon-Fri 8:30-5:00 (extended evenings Mon-Thur as needed), Sat-Sun 10:00-1:00 o Providers come to see babies at [facility] o Accepting Medicaid for families of first-time babies and families with all children in the household age 283 and under. Must register with office prior to making appointment (M-F only). Alric Quan. [facility] Odella Aquaso Henson, NP; Lynelle DoctorKnapp, MD; Susann GivensLalonde, MD; Erinysinger, [place] o [address] [place], [place] [address] o [phone] o Mon-Fri 8:00-5:00 o Babies seen by providers at [facility] o Does NOT accept Medicaid/Commercial Insurance Only . [facility] [place] [facility])  Suzette Battiesto Artis, MD; Zachery DauerBarnes, MD; Stefan ChurchBratton, MD; Sabino Dickoccaro, MD; Quitman LivingsLockett Gardner, MD; Farris HasKramer, MD; Gaynell FaceMarshall, MD; Betha LoaNetherton, MD; Colon FlatteryPoleto, MD; Clifton JamesSkinner, MD o [address] [place], [place] [address] o [phone] o Mon-Fri 8:30-5:30, Sat (Oct.-Mar.) 9:00-1:00 o Babies seen by providers at [facility] o Accepting [facility]  [place] [phone] . [facility] [place] o Reid, MD; Sheliah HatchWarner, MD o [address], [place], [place] [address] o [phone] o Mon-Fri 8:30-5:00, Sat 8:30-12:00 o Providers come to see babies at [facility] o Does NOT accept Medicaid . [facility] Cindy Hazy, [place]; Tracie Harrier, MD; [place], [place]; Wynelle Link, MD; Azucena Cecil, MD o [address], [place], [place] [address] o [phone] o Mon-Fri 8:00-5:00 o Babies seen by providers at [facility] o Does NOT accept Medicaid o Only accepting babies of parents who are patients o Please call early in hospitalization for appointment (limited availability) . [facility] Lamar Benes, MD; Abran Cantor, MD; Early Osmond, MD; Cherre Huger, NP; Hyacinth Meeker, MD; Dwan Bolt, MD; Jarold Motto, NP; Dario Guardian, MD; Talmage Nap, MD; Maisie Fus, MD; Pricilla Holm, MD; Tama High, MD o [address], [place], [place] [address] o [phone] o Mon-Fri 8:00-5:00, Sat 9:00-12:00 o Providers come to see babies at [facility] o Does NOT accept [facility] [phone]) . [facility] o Limited providers accepting new patients: Drema Pry, NP; [place], PA o [address], [place], [place] [address] o [phone] o Mon-Fri 8:00-5:00 o Babies seen by providers at [facility] o Does NOT accept Medicaid o Only accepting babies of parents who are patients o Please call early in hospitalization for appointment (limited availability) . [facility] Luan Pulling, MD; Nash Dimmer, MD o [address] [place] [place], [place] [address] o [phone] (press [phone] to schedule appointment) o Mon-Fri 8:00-5:00 o Providers come to see babies at [facility] o Does NOT accept Medicaid . [facility] Cristino Martes, MD o [address] [place], [place] [address] o [phone] o Mon-Fri 8:30-5:00 (lunch 12:30-1:00), extended hours by appointment only Wed 5:00-6:30 o Babies seen by [facility] providers o Accepting Medicaid . [facility] Abbot, MD; [place], MD; Hassan Rowan, MD o [address] [place], [place], [place] [address] o [phone] o Mon-Fri 8:00-5:00 o Babies seen by [facility] providers o Does NOT accept Medicaid . Nature conservation officer at [place] [address] [place] Soho, MD; Durene Cal, MD; [place], DO o [address] [place], [place]  [address] o [phone] o Mon-Fri 8:00-5:00 o Babies seen by [facility] providers o Does NOT accept Medicaid . [facility] o [place], [place]; [place], [place]; [place], NP; Avis Epley, MD; Vonna Kotyk, MD; Clance Boll, MD; Stevphen Rochester, NP; Arvilla Market, NP; Ann Maki, NP; Otis Dials, NP; Vaughan Basta, MD; Ocean Breeze, MD o [address] [place], [place] [address] o [phone] o Mon-Fri 8:30-5:00, Sat 10:00-1:00 o Providers come to see babies at [facility] o Does NOT accept Medicaid o Free prenatal information session Tuesdays at 4:45pm . [facility] Luna Kitchens, MD; [place], [place]; Rafael Hernandez, [place]; [place], [place] o [address] [place] [address] o [phone] o Mon-Fri 7:30-5:30 o Babies seen by [facility] providers . [facility] o [address], [place], [place]  [address] o [phone]   Fax - [phone]  [place] [phone] & [phone]) . [facility] Alphonsa [place], MD o [address] [place], [place] [address] o [phone] o Mon-Thur 8:00-6:00 o Providers come to see babies at [facility] o Accepting Medicaid . [facility] Zenon Mayo, NP; Cyndia Bent, MD; [place], [place]; [place], [place] o [address] [place], [place] [address] o [phone] o Mon-Thur 7:30-7:30, Fri 7:30-4:30 o Babies seen by [facility] providers o Accepting Medicaid . [facility] Cheryle Horsfall, MD; Janene Harvey, NP; Vonita Moss, MD o [address], [place], [place] [address] o [phone] o Mon-Fri 8:30-5:00, Sat 8:30-12:00 o Providers come to see babies at [facility] o Accepting Medicaid o Must have "Meet & Greet" appointment at office prior to delivery . [facility] - [facility]  [place]) Llana Aliment, MD; Earlene Plater, MD; Lucretia Roers, MD o [address], [place], [place] [address] o [phone] o Mon-Wed 8:00-6:00, Thur-Fri 8:00-5:00, Sat 9:00-12:00 o Providers come to  see babies at [facility] o Does NOT accept Medicaid o Only accepting siblings of current patients . [facility]  o [address], [place], [place]  [address] o [phone]   Fax - [phone] . [facility] o [phone] [address], [place], [place]  [address] o [phone]   Fax - [phone]  [place] [phone] & [phone]) . Nature conservation officer at [facility] o [place], DO; Drain, DO o [address] [place], [place] [address] o [phone] o Mon-Fri 7:00-5:00 o Babies seen by [facility] providers o Does NOT accept Medicaid . [facility] Ellis Savage, MD; [place], [place]; [place], [place] o [address], [place], [place] [address] o [phone] o Mon-Fri 8:00-5:00 o Babies seen by [facility] providers o Accepting Medicaid . [facility] - [address] Franne Forts, MD; [place], [place]; [place], NP; [place], [place] o [address] [place], [place], [place] [address] o [phone] o Mon-Fri 8:00-5:00 o Babies seen by providers at [facility] o Accepting [facility] [phone]) .  Primary Care at [facility] [place], [place] o [address] [place], [place] [address] o [phone] o Mon-Fri 8:00-5:00 o Babies seen by [facility] providers o Does NOT accept Medicaid o Limited availability, please call early in hospitalization to schedule follow-up . [facility] Jolee Ewing, PA; Eddie Candle, MD; [place], MD; [place], [place]; Constance Goltz, MD; [place], [place] o [address] [facility] [address], [place], [place] [address] o [phone] o Mon-Fri 8:30-5:00, Sat 9:00-12:00 o Babies seen by providers at [facility] o Accepting Medicaid o Please register online then schedule online or call office o [email or web address] . [facility] at  [facility]) Samuella Bruin, NP; Lucianne Muss, MD; Lanier Clam, PA o [address], [place], [place] [address] o [phone] o Mon-Fri 8:00-5:00 o Babies seen by providers at [facility] o Accepting Medicaid . [facility] ([facility]) Sharin Mons, MD; Reed Breech, NP; Shelva Majestic, MD o [address], [place], [place] [address] o [phone] o Mon-Fri 8:00-5:30, Sat&Sun by appointment (phones open at 8:30) o Babies seen by [facility] providers o Accepting Medicaid o Must be a first-time baby or sibling of current patient . [facility] [address], [place], [place]  [address] o [phone]   Fax - [phone]  [place] [phone] & [phone]) . [facility] o [place], [place]; [place], [place]; Dimple Casey, MD; [place], [place]; Carolyne Fiscal, MD o [address] [place], [place] [address] o [phone] o Mon-Thur 8:00-7:00, Fri 8:00-5:00, Sat 8:00-12:00, Sun 9:00-12:00 o Babies seen by [facility] providers o Accepting Medicaid . [facility] at [facility], MD; Gaynell Face, MD; [facility], MD o [address], [place], [place] [address] o [phone] o Mon-Thur 8:00-5:00 o Babies seen by providers at [facility] o Accepting Medicaid . [facility] at [place] Gwenlyn Saran, MD; Coe-Go[place], MD; Madilyn Fireman, MD; Melvyn Neth, MD; List, MD; Lazarus Salines, MD; Gaynell Face, MD; Berneda Rose, MD; Flora Lipps, MD; Beryl Meager, MD; Luther Redo, MD; Lavonia Drafts, MD; Kellie Simmering, MD o [address] [place]  [place], [place] [address] o [phone] o Mon-Fri 8:00-5:30, Sat (Oct.-Mar.) 9:00-1:00 o Babies seen by providers at [facility] o Accepting Medicaid o Must fill out new patient packet, available online at [email or web address] . [facility] - [place] [facility] at [facility]) Simone Curia, NP; Tiburcio Pea, NP; Tresa Endo, NP; Whitney Post, MD;  [place], [place]; Hennie Duos, MD; Wynne Dust, MD; Kavin Leech, NP o [address], [place], [place] [address] o [phone] o Mon-Thur 8:00-5:30, Fri 8:00-5:00 o Babies seen by providers at [facility] o Accepting [facility] [phone]) . [facility] o [place], [place]; [place], MD; Tanya Nones, MD; [place], [place] o [address] [place], [place] [address] o [phone] o Mon-Fri 8:00-5:00 o Babies seen by providers at [facility] o Accepting [facility] [phone]) . [facility] at [facility] o [place], DO; Lenise Arena, MD; [place], [place] o [address], [place], [place] [address] o [phone] o Mon-Fri 8:00-5:00 o Babies seen by providers at [facility] o Does NOT accept Medicaid o Limited appointment availability, please call early in hospitalization  . Nature conservation officer at [facility] o [place], DO; [place], MD o [address], [place], [place] [address] o [phone] o Mon-Fri 8:00-5:00 o Babies seen by [facility] providers o Does NOT accept Medicaid . [facility] Lorrine Kin, MD; Ninetta Lights, MD; [place], [place]; [place], MD o [address], [place], [place] [address] o [phone] o Mon-Fri 8:00-5:00 o After hours clinic [facility] [place], [place] [address]) [phone] Mon-Fri 5:00-8:00, Sat 12:00-6:00, Sun 10:00-4:00 o Babies seen by [facility] providers o Accepting Medicaid . [facility] o [address], [place], [place]  [address] o [phone]   Fax - [phone]  [place] [phone]) . Nature conservation officer at [facility], MD o [address] [place], [place], [place] [address] o [phone] o Mon-Fri 8:00-5:00 o Babies seen by [facility] providers o Does NOT accept Medicaid . [facility] - [place] [facility] at [place]) Tomi Likens, MD o [address] [place] [address], [place], [place]  [address] o [phone] o Mon-Thur 8:00-7:00, Fri 8:00-5:00, Sat 8:00-12:00 o Babies seen by providers at [facility] o Accepting Medicaid - but does not have vaccinations in office (must be received elsewhere) o Limited availability, please call early in hospitalization  [place] ([phone]) . [facility]  o Wyvonne Lenz, MD o [address], [place] [address] o [phone]  Fax [phone]

## 2020-03-03 ENCOUNTER — Other Ambulatory Visit: Payer: Self-pay

## 2020-03-03 ENCOUNTER — Telehealth (HOSPITAL_COMMUNITY): Payer: Self-pay | Admitting: *Deleted

## 2020-03-03 ENCOUNTER — Ambulatory Visit (INDEPENDENT_AMBULATORY_CARE_PROVIDER_SITE_OTHER): Payer: Medicaid Other | Admitting: Obstetrics and Gynecology

## 2020-03-03 ENCOUNTER — Encounter (HOSPITAL_COMMUNITY): Payer: Self-pay | Admitting: *Deleted

## 2020-03-03 ENCOUNTER — Other Ambulatory Visit: Payer: Self-pay | Admitting: Advanced Practice Midwife

## 2020-03-03 VITALS — BP 111/73 | HR 80 | Wt 190.5 lb

## 2020-03-03 DIAGNOSIS — O0933 Supervision of pregnancy with insufficient antenatal care, third trimester: Secondary | ICD-10-CM

## 2020-03-03 DIAGNOSIS — Z3A39 39 weeks gestation of pregnancy: Secondary | ICD-10-CM

## 2020-03-03 DIAGNOSIS — Z3493 Encounter for supervision of normal pregnancy, unspecified, third trimester: Secondary | ICD-10-CM

## 2020-03-03 DIAGNOSIS — B009 Herpesviral infection, unspecified: Secondary | ICD-10-CM

## 2020-03-03 MED ORDER — VALACYCLOVIR HCL 500 MG PO TABS: 500.0000 mg | ORAL_TABLET | Freq: Two times a day (BID) | ORAL | 0 refills | Status: DC

## 2020-03-03 NOTE — Telephone Encounter (Signed)
Preadmission screen  

## 2020-03-03 NOTE — Progress Notes (Addendum)
   PRENATAL VISIT NOTE  Subjective:  Jamie Neal is a 24 y.o. G2P1001 at [redacted]w[redacted]d being seen today for ongoing prenatal care.  She is currently monitored for the following issues for this low-risk pregnancy and has Supervision of low-risk pregnancy; Obesity (BMI 30.0-34.9); Late prenatal care affecting pregnancy, antepartum, third trimester; Insufficient prenatal care in third trimester; Herpes infection; and [redacted] weeks gestation of pregnancy on their problem list.  Patient doing well with no acute concerns today. She reports no complaints.  Contractions: Irritability. Vag. Bleeding: None.  Movement: Present. Denies leaking of fluid.   The following portions of the patient's history were reviewed and updated as appropriate: allergies, current medications, past family history, past medical history, past social history, past surgical history and problem list. Problem list updated.  Objective:   Vitals:   03/03/20 0942  BP: 111/73  Pulse: 80  Weight: 190 lb 8 oz (86.4 kg)    Fetal Status: Fetal Heart Rate (bpm): 128   Movement: Present     General:  Alert, oriented and cooperative. Patient is in no acute distress.  Skin: Skin is warm and dry. No rash noted.   Cardiovascular: Normal heart rate noted  Respiratory: Normal respiratory effort, no problems with respiration noted  Abdomen: Soft, gravid, appropriate for gestational age.  Pain/Pressure: Present     Pelvic: Cervical exam deferred        Extremities: Normal range of motion.  Edema: None  Mental Status:  Normal mood and affect. Normal behavior. Normal judgment and thought content.   Assessment and Plan:  Pregnancy: G2P1001 at [redacted]w[redacted]d  1. Encounter for supervision of low-risk pregnancy in third trimester Schedule IOL - [place] MFM FETAL BPP W/NONSTRESS; Future  2. Late prenatal care affecting pregnancy, antepartum, third trimester   3. Insufficient prenatal care in third trimester   4. Herpes infection Pt advised to start  medication ASAP for prevention of outbreak at time of labor. - valACYclovir (VALTREX) 500 MG tablet; Take 1 tablet (500 mg total) by mouth 2 (two) times daily.  Dispense: 60 tablet; Refill: 0  5. [redacted] weeks gestation of pregnancy   Term labor symptoms and general obstetric precautions including but not limited to vaginal bleeding, contractions, leaking of fluid and fetal movement were reviewed in detail with the patient. Also discussed not utilizing EMS for labor check unless there is no other way to the hospital or there is a dire emergency ie heavy bleeding or imminent delivery Please refer to After Visit Summary for other counseling recommendations.   Return in about 1 week (around 03/10/2020) for ROB, in person.   Mariel Aloe, MD

## 2020-03-03 NOTE — Progress Notes (Signed)
IOL scheduled for 03/14/20 AM and pt notified.   Fleet Contras RN 03/03/20

## 2020-03-03 NOTE — Patient Instructions (Addendum)
If you are in need of transportation to get to and from your appointments in our office.  You can reach Transportation Services by calling [phone] Monday - Friday  7am-6pm. Third Trimester of Pregnancy  The third trimester is from week 28 through week 40 (months 7 through 9). This trimester is when your unborn baby (fetus) is growing very fast. At the end of the ninth month, the unborn baby is about 20 inches in length. It weighs about 6-10 pounds. Follow these instructions at home: Medicines  Take over-the-counter and prescription medicines only as told by your doctor. Some medicines are safe and some medicines are not safe during pregnancy.  Take a prenatal vitamin that contains at least 600 micrograms (mcg) of folic acid.  If you have trouble pooping (constipation), take medicine that will make your stool soft (stool softener) if your doctor approves. Eating and drinking   Eat regular, healthy meals.  Avoid raw meat and uncooked cheese.  If you get low calcium from the food you eat, talk to your doctor about taking a daily calcium supplement.  Eat four or five small meals rather than three large meals a day.  Avoid foods that are high in fat and sugars, such as fried and sweet foods.  To prevent constipation: ? Eat foods that are high in fiber, like fresh fruits and vegetables, whole grains, and beans. ? Drink enough fluids to keep your pee (urine) clear or pale yellow. Activity  Exercise only as told by your doctor. Stop exercising if you start to have cramps.  Avoid heavy lifting, wear low heels, and sit up straight.  Do not exercise if it is too hot, too humid, or if you are in a place of great height (high altitude).  You may continue to have sex unless your doctor tells you not to. Relieving pain and discomfort  Wear a good support bra if your breasts are tender.  Take frequent breaks and rest with your legs raised if you have leg cramps or low back pain.  Take  warm water baths (sitz baths) to soothe pain or discomfort caused by hemorrhoids. Use hemorrhoid cream if your doctor approves.  If you develop puffy, bulging veins (varicose veins) in your legs: ? Wear support hose or compression stockings as told by your doctor. ? Raise (elevate) your feet for 15 minutes, 3-4 times a day. ? Limit salt in your food. Safety  Wear your seat belt when driving.  Make a list of emergency phone numbers, including numbers for family, friends, the hospital, and police and fire departments. Preparing for your baby's arrival To prepare for the arrival of your baby:  Take prenatal classes.  Practice driving to the hospital.  Visit the hospital and tour the maternity area.  Talk to your work about taking leave once the baby comes.  Pack your hospital bag.  Prepare the baby's room.  Go to your doctor visits.  Buy a rear-facing car seat. Learn how to install it in your car. General instructions  Do not use hot tubs, steam rooms, or saunas.  Do not use any products that contain nicotine or tobacco, such as cigarettes and e-cigarettes. If you need help quitting, ask your doctor.  Do not drink alcohol.  Do not douche or use tampons or scented sanitary pads.  Do not cross your legs for long periods of time.  Do not travel for long distances unless you must. Only do so if your doctor says it is okay.  Visit your dentist if you have not gone during your pregnancy. Use a soft toothbrush to brush your teeth. Be gentle when you floss.  Avoid cat litter boxes and soil used by cats. These carry germs that can cause birth defects in the baby and can cause a loss of your baby (miscarriage) or stillbirth.  Keep all your prenatal visits as told by your doctor. This is important. Contact a doctor if:  You are not sure if you are in labor or if your water has broken.  You are dizzy.  You have mild cramps or pressure in your lower belly.  You have a nagging  pain in your belly area.  You continue to feel sick to your stomach, you throw up, or you have watery poop.  You have bad smelling fluid coming from your vagina.  You have pain when you pee. Get help right away if:  You have a fever.  You are leaking fluid from your vagina.  You are spotting or bleeding from your vagina.  You have severe belly cramps or pain.  You lose or gain weight quickly.  You have trouble catching your breath and have chest pain.  You notice sudden or extreme puffiness (swelling) of your face, hands, ankles, feet, or legs.  You have not felt the baby move in over an hour.  You have severe headaches that do not go away with medicine.  You have trouble seeing.  You are leaking, or you are having a gush of fluid, from your vagina before you are 37 weeks.  You have regular belly spasms (contractions) before you are 37 weeks. Summary  The third trimester is from week 28 through week 40 (months 7 through 9). This time is when your unborn baby is growing very fast.  Follow your doctor's advice about medicine, food, and activity.  Get ready for the arrival of your baby by taking prenatal classes, getting all the baby items ready, preparing the baby's room, and visiting your doctor to be checked.  Get help right away if you are bleeding from your vagina, or you have chest pain and trouble catching your breath, or if you have not felt your baby move in over an hour. This information is not intended to replace advice given to you by your health care provider. Make sure you discuss any questions you have with your health care provider. Document Revised: 08/08/2018 Document Reviewed: 05/23/2016 Elsevier Patient Education  2020 [employer].

## 2020-03-09 ENCOUNTER — Encounter: Payer: Self-pay | Admitting: General Practice

## 2020-03-09 ENCOUNTER — Other Ambulatory Visit (INDEPENDENT_AMBULATORY_CARE_PROVIDER_SITE_OTHER): Payer: Medicaid Other | Admitting: Obstetrics and Gynecology

## 2020-03-09 ENCOUNTER — Other Ambulatory Visit: Payer: Self-pay | Admitting: Advanced Practice Midwife

## 2020-03-09 NOTE — Progress Notes (Signed)
IOL orders 

## 2020-03-10 ENCOUNTER — Ambulatory Visit: Payer: Self-pay

## 2020-03-10 ENCOUNTER — Other Ambulatory Visit: Payer: Self-pay

## 2020-03-10 ENCOUNTER — Ambulatory Visit (INDEPENDENT_AMBULATORY_CARE_PROVIDER_SITE_OTHER): Payer: Medicaid Other | Admitting: Student

## 2020-03-10 ENCOUNTER — Other Ambulatory Visit: Payer: Self-pay | Admitting: Advanced Practice Midwife

## 2020-03-10 ENCOUNTER — Ambulatory Visit (INDEPENDENT_AMBULATORY_CARE_PROVIDER_SITE_OTHER): Payer: Medicaid Other | Admitting: *Deleted

## 2020-03-10 VITALS — BP 112/71 | HR 88 | Wt 190.1 lb

## 2020-03-10 DIAGNOSIS — Z3493 Encounter for supervision of normal pregnancy, unspecified, third trimester: Secondary | ICD-10-CM

## 2020-03-10 DIAGNOSIS — O480 Post-term pregnancy: Secondary | ICD-10-CM | POA: Diagnosis not present

## 2020-03-10 NOTE — Progress Notes (Signed)
IOL scheduled on 11/14.

## 2020-03-10 NOTE — Progress Notes (Signed)
Patient ID: Jamie Neal, female   DOB: February 12, 1996, 24 y.o.   MRN: [id number]     PRENATAL VISIT NOTE  Subjective:  Jamie Neal is a 24 y.o. G2P1001 at [redacted]w[redacted]d being seen today for ongoing prenatal care.  She is currently monitored for the following issues for this low-risk pregnancy and has Supervision of low-risk pregnancy; Obesity (BMI 30.0-34.9); Late prenatal care affecting pregnancy, antepartum, third trimester; Insufficient prenatal care in third trimester; Herpes infection; and [redacted] weeks gestation of pregnancy on their problem list.  Patient reports no complaints.Patient with many questions about IOL, where to go, when and what to expect.Patient is taking her valtrex.   Contractions: Irregular. Vag. Bleeding: None.  Movement: Present. Denies leaking of fluid.   The following portions of the patient's history were reviewed and updated as appropriate: allergies, current medications, past family history, past medical history, past social history, past surgical history and problem list.   Objective:   Vitals:   03/10/20 1135  BP: 112/71  Pulse: 88  Weight: 190 lb 1.6 oz (86.2 kg)    Fetal Status: Fetal Heart Rate (bpm): NST   Movement: Present     General:  Alert, oriented and cooperative. Patient is in no acute distress.  Skin: Skin is warm and dry. No rash noted.   Cardiovascular: Normal heart rate noted  Respiratory: Normal respiratory effort, no problems with respiration noted  Abdomen: Soft, gravid, appropriate for gestational age.  Pain/Pressure: Present     Pelvic: Cervical exam deferred        Extremities: Normal range of motion.     Mental Status: Normal mood and affect. Normal behavior. Normal judgment and thought content.   Assessment and Plan:  Pregnancy: G2P1001 at [redacted]w[redacted]d 1. Encounter for supervision of low-risk pregnancy in third trimester BPP is 8/10 (see RN note) Cervix *not checked* at this visit NST is reactive; 130 bpm, mod var, present acels, no decels,  irregular contractions 2. Post term pregnancy, antepartum condition or complication -Discussed FB, patient is amenable to the FB placement and wants to come to [facility] on Saturday evening -Reviewed where to go, what to bring, COVID test ahead of time, waiting by phone for IOL call on Sunday morning.  -List of peds given again  Term labor symptoms and general obstetric precautions including but not limited to vaginal bleeding, contractions, leaking of fluid and fetal movement were reviewed in detail with the patient. Please refer to After Visit Summary for other counseling recommendations.   No follow-ups on file.  Future Appointments  Date Time Provider Department Center  03/12/2020  9:45 AM [facility] None  03/14/2020  7:35 AM [facility] SCHED ROOM [facility] None    Jamie Neal, CNM

## 2020-03-10 NOTE — Patient Instructions (Addendum)
-go to the [facility] on Saturday night between 5-9 pm for your balloon insertion; plan to be there about an hour.    Balloon Catheter Placement for Cervical Ripening Balloon catheter placement for cervical ripening is a procedure to help your cervix start to soften (ripen) and open (dilate). It is done to prepare your body for labor induction. During this procedure, a thin tube (catheter) is placed through your cervix. A tiny balloon attached to the catheter is inflated with water. Pressure from the balloon is what helps your cervix start to open. Cervical ripening with a balloon catheter can make labor induction shorter and easier. You may have this procedure if:  Your cervix is not ready for labor.  Your health care provider has planned labor induction.  You are not having twins or multiples.  Your baby is in the head-down position.  You do not have any other pregnancy complications that require you to be monitored in the hospital after balloon catheter placement. If your health care provider has recommended labor induction to stimulate a vaginal birth, this procedure may be started the day before induction. You will go home with the balloon in place and return to start induction in 12-24 hours. You may have this procedure and stay in the hospital so that your progress can be monitored as well. Tell a health care provider about:  Any allergies you have.  All medicines you are taking, including vitamins, herbs, eye drops, creams, and over-the-counter medicines.  Any blood disorders you have.  Any surgeries you have had.  Any medical conditions you have. What are the risks? Generally, this is a safe procedure. However, problems may occur, including:  Infection.  Bleeding.  Cramping or pain.  Difficulty passing urine.  The baby moving from the head-down position to a position with the feet or buttocks down (breech position). What happens before the  procedure?  Your health care provider may check your baby's heartbeat (fetal monitoring) before the procedure.  You may be asked to empty your bladder. What happens during the procedure?   You will be positioned on the exam table as if you were having a pelvic exam or Pap test.  Your health care provider may insert a medical instrument into your vagina (speculum) to see your cervix.  Your cervix may be cleaned with a germ-killing solution.  The catheter will be inserted through the opening of your cervix.  A balloon on the end of the catheter will be inflated with sterile water. Some catheters have two balloons, one on each side of the cervix.  Depending on the type of balloon catheter, the end of the catheter may be left free outside your cervix or taped to your leg. The procedure may vary among health care providers and hospitals. What can I expect after the procedure?  After the procedure, it is common to have: ? A feeling of pressure inside your vagina. ? Light vaginal bleeding (spotting).  You may have fetal monitoring before going home.  You may be sent home with the catheter in place and asked to return to start your induction in about 12-24 hours. Follow these instructions at home:  Take over-the-counter and prescription medicines only as told by your health care provider.  Return to your normal activities at home as told by your health care provider. Ask your health care provider what activities are safe for you. Do not leave home until you return for labor induction.  You may shower at  home. Do not take baths, swim, or use a hot tub unless your health care provider approves.  As your cervix opens, your catheter and balloon may fall out before you return for labor induction. Ask your health care provider what you should do if this happens.  Keep all follow-up visits as told by your health care provider. This is important. You will need to return to start induction as  told by your health care provider. Contact your health care provider if:  You have chills or a fever.  You have constant pain or cramps (not contractions).  You have trouble passing urine.  Your water breaks.  You have vaginal bleeding that is heavier than spotting.  You have contractions that start to last longer and come closer together (about every 5 minutes).  The balloon catheter falls out before you return to start your induction. Summary  Cervical ripening with placement of a balloon catheter is an outpatient procedure to prepare you for labor induction.  Cervical ripening with a balloon catheter helps your cervix start to open for birth.  You will be positioned on the exam table. The catheter will be inserted through the opening of your cervix. A balloon on the end of the catheter will be inflated with water.  Pressure from the balloon will cause ripening of your cervix. You will go home with the balloon in place and return to start induction in 12-24 hours.  Contact your health care provider if you have pain, fever, vaginal bleeding, or trouble passing urine. Also contact him or her if your water breaks, you start to go into labor, or your balloon catheter falls out before you return to start your induction. This information is not intended to replace advice given to you by your health care provider. Make sure you discuss any questions you have with your health care provider. Document Revised: 12/14/2017 Document Reviewed: 12/14/2017 Elsevier Patient Education  2020 Elsevier Inc.  AREA PEDIATRIC/FAMILY PRACTICE PHYSICIANS  Central/[place]outheast [place] ([address]) . [facility] Melodie Bouillon, MD; Lum Babe, MD; Sheffield Slider, MD; Leveda Anna, MD; McDiarmid, MD; Jerene Bears, MD; Jennette Kettle, MD; Gwendolyn Grant, MD o [address] [place], [place] [address] o [phone] o Mon-Fri 8:30-12:30, 1:30-5:00 o Providers come to see babies at [facility] o Accepting Medicaid . [facility] [place] o Limited providers who accept newborns: Docia Chuck, MD; Kateri Plummer, MD; Paulino Rily, MD o [address], [place], [place] [address] o [phone] o Mon-Fri 8:00-5:30 o Babies seen by providers at [facility] o Does NOT accept Medicaid o Please call early in hospitalization for appointment (limited availability)  . [facility] Fatima Sanger, MD o [address] [place], [place] [address] o [phone] o Mon, Tue, Thur, Fri 8:30-5:00, Wed 10:00-7:00 (closed 1-2pm) o Babies seen by [facility] providers o Accepting Medicaid . Donnie Coffin - Pediatrician Fae Pippin, MD o [address], [place], [place] [address] o [phone] o Mon-Fri 8:30-5:00, Sat 8:30-12:00 o Provider comes to see babies at [facility] o Accepting Medicaid o Must have been referred from current patients or contacted office prior to delivery . [facility]) Leotis Pain, MD; Ave Filter, MD; Luna Fuse, MD; Kennedy Bucker, MD; Konrad Dolores, MD; Kathlene November, MD; Jenne Campus, MD; Lubertha South, MD; Wynetta Emery, MD; Duffy Rhody, MD; Gerre Couch, NP; Shirl Harris, NP o [address], [place], [place] [address] o [phone] o [address] Tue, Thur, Fri 8:30-5:30, Wed 9:30-5:30, Sat 8:30-12:30 o  Babies seen by [facility] providers o Accepting Medicaid o Only accepting infants of first-time parents or siblings of current patients o Hospital discharge coordinator will make follow-up appointment . Cyril Mourning o [address] [place], [place]  [address] o [phone]   Fax - [phone] . [facility] o [address], [place], [place]  [address] o Phone - [phone]   Fax - [phone] . Lucio Edward o [address], [place], [place]  [address] o [phone]  [place] [phone]) . [facility] Jorge Mandril, MD; Alita Chyle, MD; Princella Ion, MD; MD; Earlene Plater, MD;  Jamesetta Orleans, MD; Alvera Novel, MD; Clarene Duke, MD; Rana Snare, MD; Carmon Ginsberg, MD; Alinda Money, MD; Hosie Poisson, MD; Mayford Knife, MD o [address] [place], [place] [address] o [phone] o Mon-Fri 8:30-5:00 (extended evenings Mon-Thur as needed), Sat-Sun 10:00-1:00 o Providers come to see babies at [facility] o Accepting Medicaid for families of first-time babies and families with all children in the household age 38 and under. Must register with office prior to making appointment (M-F only). [facility] Odella Aquas, NP; Lynelle Doctor, MD; Susann Givens, MD; [place], [place] o [address], [place], [place] [address] o [phone] o Mon-Fri 8:00-5:00 o Babies seen by providers at [facility] o Does NOT accept Medicaid/Commercial Insurance Only . [facility] [place] ([facility])  Suzette Battiest, MD; Zachery Dauer, MD; Stefan Church, MD; Sabino Dick, MD; Quitman Livings, MD; Farris Has, MD; Gaynell Face, MD; Betha Loa, MD; Colon Flattery, MD; Clifton James, MD o [address] [place], [place] [address] o [phone] o Mon-Fri 8:30-5:30, Sat (Oct.-Mar.) 9:00-1:00 o Babies seen by providers at [facility] o Accepting [facility] [phone]) . [facility] Dimitri, MD; Sheliah Hatch, MD o [address], [place], [place] [address] o [phone] o Mon-Fri 8:30-5:00, Sat 8:30-12:00 o Providers come to see babies at [facility] o Does NOT accept Medicaid . [facility] Cindy Hazy, [place]; [place], MD; [place], [place]; Wynelle Link, MD; Azucena Cecil, MD o [address], [place], [place] [address] o [phone] o Mon-Fri 8:00-5:00 o Babies seen by providers at [facility] o Does NOT accept Medicaid o Only accepting babies of parents who are patients o Please call early in hospitalization for appointment (limited availability) . [facility] Lamar Benes, MD; Abran Cantor, MD; Early Osmond, MD; Cherre Huger, NP; Hyacinth Meeker, MD; Dwan Bolt, MD; Jarold Motto, NP; Dario Guardian, MD; Talmage Nap, MD;  Maisie Fus, MD; Pricilla Holm, MD; Tama High, MD o [address] [place], [place], [place] [address] o [phone] o Mon-Fri 8:00-5:00, Sat 9:00-12:00 o Providers come to see babies at [facility] o Does NOT accept [facility] [phone]) . [facility] at [facility] o Limited providers accepting new patients: Drema Pry, NP; [place], PA o [address], [place], [place] [address] o [phone] o Mon-Fri 8:00-5:00 o Babies seen by providers at [facility] o Does NOT accept Medicaid o Only accepting babies of parents who are patients o Please call early in hospitalization for appointment (limited availability) . [facility] Luan Pulling, MD; Nash Dimmer, MD o [address] [place] [place], [place] [address] o [phone] (press 1 to schedule appointment) o Mon-Fri 8:00-5:00 o Providers come to see babies at [facility] o Does NOT accept Medicaid . [facility] Cristino Martes, MD o [address] [place], [place] [address] o [phone] o Mon-Fri 8:30-5:00 (lunch 12:30-1:00), extended hours by appointment only Wed 5:00-6:30 o Babies seen by [facility] providers o Accepting Medicaid . Nature conservation officer at [employer]  Marykay Lex, MD; [place], MD; Hassan Rowan, MD o [address] [place], [place], [place] [address] o [phone] o Mon-Fri 8:00-5:00 o Babies seen by [facility] providers o Does NOT accept Medicaid . Nature conservation officer at [place] [address] Elsworth Soho, MD; Durene Cal, MD; Fitzgerald, DO o [address] [place], [place] [address] o [phone] o Mon-Fri 8:00-5:00 o Babies seen by [facility] providers o Does NOT accept Medicaid . [place] o [place], [place]; [place], [place]; Gerlach, NP; Avis Epley, MD; Vonna Kotyk, MD; Clance Boll, MD; Stevphen Rochester, NP; Arvilla Market, NP; Ann Maki, NP; Otis Dials, NP; Vaughan Basta, MD; Alma, MD o [address] [place], [place] [address] o [phone] o Mon-Fri 8:30-5:00, Sat 10:00-1:00 o Providers come to see babies at  [facility] o Does NOT accept Medicaid o Free prenatal information session Tuesdays at 4:45pm . [facility] Luna Kitchens, MD; [place], [place]; [place], [place]; [place], [place] o [address] [place] [address] o [phone] o Mon-Fri 7:30-5:30 o Babies seen by [facility] providers . [facility] o [address], [place], [place]  [address] o [phone]   Fax - [phone]  [place] [phone] & [phone]) . [facility] Alphonsa Overall, MD o [address] [place], [place] [address] o [phone] o Mon-Thur 8:00-6:00 o Providers come to see babies at [facility] o Accepting Medicaid . [facility] Zenon Mayo, NP; Cyndia Bent, MD; [place], [place]; [place], [place] o [address] [place], [place] [address] o [phone] o Mon-Thur 7:30-7:30, Fri 7:30-4:30 o Babies seen by [facility] providers o Accepting Medicaid . [facility] Cheryle Horsfall, MD; Janene Harvey, NP; Vonita Moss, MD o [address], [place], [place] [address] o [phone] o Mon-Fri 8:30-5:00, Sat 8:30-12:00 o Providers come to see babies at [facility] o Accepting Medicaid o Must have "Meet & Greet" appointment at office prior to delivery . [facility]) Llana Aliment, MD; Earlene Plater, MD; Lucretia Roers, MD o [address], [place], [place] [address] o [phone] o Mon-Wed 8:00-6:00, Thur-Fri 8:00-5:00, Sat 9:00-12:00 o Providers come to see babies at [facility] o Does NOT accept Medicaid o Only accepting siblings of current patients . [facility]  o [address], [place], [place]  [address] o [phone]   Fax - [phone] . [facility] o [phone] [address], [place], [place]  [address] o [phone]   Fax - [phone]  [place] [phone] &  [phone]) . Nature conservation officer at [facility] o [place], DO; [place], DO o [address] [place], [place] [address] o [phone] o Mon-Fri 7:00-5:00 o Babies seen by [facility] providers o Does NOT accept Medicaid . [facility] Ellis Savage, MD; [place], [place]; [place], [place] o [address], [place], [place] [address] o [phone] o Mon-Fri 8:00-5:00 o Babies seen by [facility] providers o Accepting Medicaid . [facility] - [address] Franne Forts, MD; [place], [place]; Joy, NP; [place], [place] o [address] [place], [place], [place] [address] o [phone] o Mon-Fri 8:00-5:00 o Babies seen by providers at [facility] o Accepting [facility] [place] [phone]) . [facility] [place], [place] o [address] [place], [place] [address] o [phone] o Mon-Fri 8:00-5:00 o Babies seen by [facility] providers o Does NOT accept Medicaid o Limited availability, please call early in  hospitalization to schedule follow-up . [facility] Jolee Ewingo Calderon, PA; Eddie Candleummings, MD; DundalkDillard, MD; Cam[place]Martin, [place]; Constance Goltzlson, MD; [place], [place] o [id number] [facility] [address], [place], [place] [address] o [phone] o Mon-Fri 8:30-5:00, Sat 9:00-12:00 o Babies seen by providers at [facility] o Accepting Medicaid o Please register online then schedule online or call office o [email or web address] . [facility]) Samuella Bruino Hunter, NP; Lucianne MussKumar, MD; Lanier ClamMartin Rogers, PA o [address], [place], [place] [address] o [phone] o Mon-Fri 8:00-5:00 o Babies seen by providers at [facility] o Accepting Medicaid . [facility] ([facility]) Sharin Monso [place], MD; Reed BreechKristi Fleenor, NP; Shelva MajesticWest, MD o [address], [place], [place] [address] o [phone] o Mon-Fri  8:00-5:30, Sat&Sun by appointment (phones open at 8:30) o Babies seen by [facility] providers o Accepting Medicaid o Must be a first-time baby or sibling of current patient . [facility]  o [address], [place], [place]  [address] o [phone]   Fax - [phone]  [place] [phone] & [phone]) . [facility] o [place], [place]; [place], [place]; Dimple Caseyice, MD; [place], [place]; Carolyne FiscalSpry, MD o [address] [place], [place] [address] o [phone] o Mon-Thur 8:00-7:00, Fri 8:00-5:00, Sat 8:00-12:00, Sun 9:00-12:00 o Babies seen by [facility] providers o Accepting Medicaid . [facility] at [facility] o Coe-Goins, MD; Gaynell FaceMarshall, MD; [facility] Asc LLC[facility]re-Louis, MD o [address], [place], [place] [address] o [phone] o Mon-Thur 8:00-5:00 o Babies seen by providers at [facility] o Accepting Medicaid . [facility] [place] Gwenlyn Sarano Bratton, MD; Coe-Goins, MD; Madilyn FiremanHayes, MD; Melvyn NethLewis, MD; List, MD; Lazarus SalinesLott, MD; Gaynell FaceMarshall, MD; Berneda RoseMoran, MD; Flora Lipps'Neal, MD; Beryl MeagerPierre-Louis, MD; Luther RedoPitonzo, MD; Lavonia DraftsScholer, MD; Kellie SimmeringSpangle, MD o [address] [place], [place] [address] o [phone] o Mon-Fri 8:00-5:30, Sat (Oct.-Mar.) 9:00-1:00 o Babies seen by providers at [facility] o Accepting Medicaid o Must fill out new patient packet, available online at [email or web address] . [facility] [address] [facility]) Simone Curiao Friddle, NP; Tiburcio PeaHarris, NP; Tresa EndoKelly, NP; Whitney PostLogan, MD; Huey[place]vin, [place]; Hennie DuosPoth, MD; Wynne Dustamadoss, MD; Kavin LeechStanton, NP o [address], [place], [place] [address] o [phone] o Mon-Thur 8:00-5:30, Fri 8:00-5:00 o Babies seen by providers at [facility] o Accepting [facility]  [place] [phone]) . [facility] o [place], [place]; [place] Bay[place], MD; Tanya NonesPickard, MD; Lorraineapia, [place] o [address], [place], [place] [address] o [phone] o Mon-Fri  8:00-5:00 o Babies seen by providers at [facility] o Accepting [facility] CenterMed[facility]   [place] [phone]) . [facility] o KadokaMasneri, DO; Lenise ArenaMeyers, MD; [place], [place] o [address], [place], [place] [address] o [phone] o Mon-Fri 8:00-5:00 o Babies seen by providers at [facility] o Does NOT accept Medicaid o Limited appointment availability, please call early in hospitalization  . Nature conservation officerLeBauer HealthCare at [facility] [place] o BlackwaterKunedd, DO; Midland CityMcGowen, MD o [address], [place], [place] [address] o [phone] o Mon-Fri 8:00-5:00 o Babies seen by [facility] providers o Does NOT accept Medicaid . [facility] Lorrine Kino Cameron, MD; Ninetta LightsMacDonald, MD; [place], [place]; LauriumNayak, MD o [address], [place], [place] [address] o [phone] o Mon-Fri 8:00-5:00 o After hours clinic (  [address] [place], [place] [address]) [phone] Mon-Fri 5:00-8:00, Sat 12:00-6:00, Sun 10:00-4:00 o Babies seen by [facility] providers o Accepting Medicaid . [facility] at [facility] o [address] [place], [place]  [address] o [phone]   Fax - [phone]  [place] [phone]) . Nature conservation officer at [facility], MD o [address] [place], [place], [place] [address] o [phone] o Mon-Fri 8:00-5:00 o Babies seen by [facility] providers o Does NOT accept Medicaid . [facility] - [place] [facility] [place]) Tomi Likens, MD o [address] [place] [address] [place], [place] [address] o [phone] o Mon-Thur 8:00-7:00, Fri 8:00-5:00, Sat 8:00-12:00 o Babies seen by providers at [facility] o Accepting Medicaid - but does not have vaccinations in office (must be received elsewhere) o Limited availability, please call early in hospitalization  [place] ([phone]) . [facility]  o Wyvonne Lenz, MD o [address] [place] [address] o [phone]  Fax  [phone]

## 2020-03-12 ENCOUNTER — Inpatient Hospital Stay (HOSPITAL_COMMUNITY): Payer: Medicaid - Out of State | Admitting: Anesthesiology

## 2020-03-12 ENCOUNTER — Inpatient Hospital Stay (HOSPITAL_COMMUNITY): Payer: Medicaid - Out of State

## 2020-03-12 ENCOUNTER — Inpatient Hospital Stay (HOSPITAL_COMMUNITY)
Admission: AD | Admit: 2020-03-12 | Discharge: 2020-03-15 | DRG: 787 | Disposition: A | Discharge status: Home / Self Care | Payer: Medicaid - Out of State | Attending: Obstetrics and Gynecology | Admitting: Obstetrics and Gynecology

## 2020-03-12 ENCOUNTER — Encounter (HOSPITAL_COMMUNITY)
Admission: AD | Disposition: A | Discharge status: Home / Self Care | Payer: Self-pay | Source: Home / Self Care | Attending: Obstetrics and Gynecology

## 2020-03-12 ENCOUNTER — Encounter (HOSPITAL_COMMUNITY): Payer: Self-pay | Admitting: Obstetrics and Gynecology

## 2020-03-12 ENCOUNTER — Other Ambulatory Visit: Payer: Self-pay

## 2020-03-12 ENCOUNTER — Other Ambulatory Visit (HOSPITAL_COMMUNITY): Payer: Medicaid Other | Attending: Family Medicine

## 2020-03-12 DIAGNOSIS — D62 Acute posthemorrhagic anemia: Secondary | ICD-10-CM | POA: Diagnosis not present

## 2020-03-12 DIAGNOSIS — A6000 Herpesviral infection of urogenital system, unspecified: Secondary | ICD-10-CM | POA: Diagnosis present

## 2020-03-12 DIAGNOSIS — E669 Obesity, unspecified: Secondary | ICD-10-CM | POA: Diagnosis present

## 2020-03-12 DIAGNOSIS — O99214 Obesity complicating childbirth: Secondary | ICD-10-CM | POA: Diagnosis present

## 2020-03-12 DIAGNOSIS — O9832 Other infections with a predominantly sexual mode of transmission complicating childbirth: Principal | ICD-10-CM | POA: Diagnosis present

## 2020-03-12 DIAGNOSIS — O9081 Anemia of the puerperium: Secondary | ICD-10-CM | POA: Diagnosis not present

## 2020-03-12 DIAGNOSIS — O480 Post-term pregnancy: Secondary | ICD-10-CM

## 2020-03-12 DIAGNOSIS — Z3490 Encounter for supervision of normal pregnancy, unspecified, unspecified trimester: Secondary | ICD-10-CM

## 2020-03-12 DIAGNOSIS — Z3A40 40 weeks gestation of pregnancy: Secondary | ICD-10-CM

## 2020-03-12 DIAGNOSIS — Z20822 Contact with and (suspected) exposure to covid-19: Secondary | ICD-10-CM | POA: Diagnosis present

## 2020-03-12 DIAGNOSIS — O26893 Other specified pregnancy related conditions, third trimester: Secondary | ICD-10-CM | POA: Diagnosis present

## 2020-03-12 LAB — CBC
HCT: 33.4 % — ABNORMAL LOW (ref 36.0–46.0)
Hemoglobin: 10.4 g/dL — ABNORMAL LOW (ref 12.0–15.0)
MCH: 26.7 pg (ref 26.0–34.0)
MCHC: 31.1 g/dL (ref 30.0–36.0)
MCV: 85.6 fL (ref 80.0–100.0)
Platelets: 177 10*3/uL (ref 150–400)
RBC: 3.9 MIL/uL (ref 3.87–5.11)
RDW: 14.9 % (ref 11.5–15.5)
WBC: 8.2 10*3/uL (ref 4.0–10.5)
nRBC: 0 % (ref 0.0–0.2)

## 2020-03-12 LAB — RESPIRATORY PANEL BY RT PCR (FLU A&B, COVID)
Influenza A by PCR: NEGATIVE
Influenza B by PCR: NEGATIVE
SARS Coronavirus 2 by RT PCR: NEGATIVE

## 2020-03-12 LAB — TYPE AND SCREEN
ABO/RH(D): O POS
Antibody Screen: NEGATIVE

## 2020-03-12 LAB — CREATININE, SERUM
Creatinine, Ser: 0.64 mg/dL (ref 0.44–1.00)
GFR, Estimated: >60 mL/min (ref >60)

## 2020-03-12 LAB — RPR: RPR Ser Ql: NONREACTIVE

## 2020-03-12 SURGERY — Surgical Case
Anesthesia: Epidural | Wound class: Clean Contaminated

## 2020-03-12 MED ORDER — COCONUT OIL OIL: 1.0000 "application " | TOPICAL_OIL | Status: DC | PRN

## 2020-03-12 MED ORDER — LACTATED RINGERS IV SOLN: INTRAVENOUS | Status: DC

## 2020-03-12 MED ORDER — LACTATED RINGERS IV SOLN: 500.0000 mL | INTRAVENOUS | Status: DC | PRN

## 2020-03-12 MED ORDER — PRENATAL MULTIVITAMIN CH
1.0000 | ORAL_TABLET | Freq: Every day | ORAL | Status: DC
  Administered 2020-03-13 – 2020-03-15 (×3): 1 via ORAL
  Filled 2020-03-12 (×3): qty 1

## 2020-03-12 MED ORDER — SOD CITRATE-CITRIC ACID 500-334 MG/5ML PO SOLN
ORAL | Status: AC
  Filled 2020-03-12: qty 15

## 2020-03-12 MED ORDER — NALBUPHINE HCL 10 MG/ML IJ SOLN: 5.0000 mg | INTRAMUSCULAR | Status: DC | PRN

## 2020-03-12 MED ORDER — OXYTOCIN-SODIUM CHLORIDE 30-0.9 UT/500ML-% IV SOLN: 2.5000 [IU]/h | INTRAVENOUS | Status: DC

## 2020-03-12 MED ORDER — PHENYLEPHRINE 40 MCG/ML (10ML) SYRINGE FOR IV PUSH (FOR BLOOD PRESSURE SUPPORT): 80.0000 ug | PREFILLED_SYRINGE | INTRAVENOUS | Status: DC | PRN

## 2020-03-12 MED ORDER — DIPHENHYDRAMINE HCL 50 MG/ML IJ SOLN: 12.5000 mg | INTRAMUSCULAR | Status: DC | PRN

## 2020-03-12 MED ORDER — FENTANYL CITRATE (PF) 100 MCG/2ML IJ SOLN: 50.0000 ug | INTRAMUSCULAR | Status: DC | PRN

## 2020-03-12 MED ORDER — FENTANYL CITRATE (PF) 250 MCG/5ML IJ SOLN
INTRAMUSCULAR | Status: AC
  Filled 2020-03-12: qty 5

## 2020-03-12 MED ORDER — ACETAMINOPHEN 325 MG PO TABS
650.0000 mg | ORAL_TABLET | ORAL | Status: DC | PRN
  Administered 2020-03-12 – 2020-03-15 (×8): 650 mg via ORAL
  Filled 2020-03-12 (×9): qty 2

## 2020-03-12 MED ORDER — ACETAMINOPHEN 10 MG/ML IV SOLN
1000.0000 mg | Freq: Once | INTRAVENOUS | Status: AC
  Administered 2020-03-12: 1000 mg via INTRAVENOUS

## 2020-03-12 MED ORDER — SODIUM CHLORIDE (PF) 0.9 % IJ SOLN
INTRAMUSCULAR | Status: DC | PRN
  Administered 2020-03-12: 12 mL/h via EPIDURAL

## 2020-03-12 MED ORDER — PHENYLEPHRINE HCL (PRESSORS) 10 MG/ML IV SOLN
INTRAVENOUS | Status: DC | PRN
  Administered 2020-03-12: 40 ug via INTRAVENOUS
  Administered 2020-03-12: 80 ug via INTRAVENOUS
  Administered 2020-03-12: 40 ug via INTRAVENOUS

## 2020-03-12 MED ORDER — MIDAZOLAM HCL 2 MG/2ML IJ SOLN
INTRAMUSCULAR | Status: AC
  Filled 2020-03-12: qty 2

## 2020-03-12 MED ORDER — NALOXONE HCL 4 MG/10ML IJ SOLN
1.0000 ug/kg/h | INTRAVENOUS | Status: DC | PRN
  Filled 2020-03-12: qty 5

## 2020-03-12 MED ORDER — FENTANYL CITRATE (PF) 100 MCG/2ML IJ SOLN
50.0000 ug | INTRAMUSCULAR | Status: DC | PRN
  Administered 2020-03-12: 100 ug via INTRAVENOUS
  Filled 2020-03-12: qty 2

## 2020-03-12 MED ORDER — OXYCODONE-ACETAMINOPHEN 5-325 MG PO TABS: 1.0000 | ORAL_TABLET | ORAL | Status: DC | PRN

## 2020-03-12 MED ORDER — MISOPROSTOL 25 MCG QUARTER TABLET: 25.0000 ug | ORAL_TABLET | ORAL | Status: DC | PRN

## 2020-03-12 MED ORDER — LACTATED RINGERS IV SOLN: INTRAVENOUS | Status: DC | PRN

## 2020-03-12 MED ORDER — ONDANSETRON HCL 4 MG/2ML IJ SOLN: 4.0000 mg | Freq: Four times a day (QID) | INTRAMUSCULAR | Status: DC | PRN

## 2020-03-12 MED ORDER — MENTHOL 3 MG MT LOZG
1.0000 | LOZENGE | OROMUCOSAL | Status: DC | PRN
  Administered 2020-03-13 – 2020-03-15 (×3): 3 mg via ORAL
  Filled 2020-03-12 (×2): qty 9

## 2020-03-12 MED ORDER — ZOLPIDEM TARTRATE 5 MG PO TABS: 5.0000 mg | ORAL_TABLET | Freq: Every evening | ORAL | Status: DC | PRN

## 2020-03-12 MED ORDER — SCOPOLAMINE 1 MG/3DAYS TD PT72: 1.0000 | MEDICATED_PATCH | Freq: Once | TRANSDERMAL | Status: DC

## 2020-03-12 MED ORDER — HYDROXYZINE HCL 50 MG PO TABS: 50.0000 mg | ORAL_TABLET | Freq: Four times a day (QID) | ORAL | Status: DC | PRN

## 2020-03-12 MED ORDER — HYDROMORPHONE HCL 1 MG/ML IJ SOLN
0.2500 mg | INTRAMUSCULAR | Status: DC | PRN
  Administered 2020-03-12 (×4): 0.5 mg via INTRAVENOUS

## 2020-03-12 MED ORDER — DIPHENHYDRAMINE HCL 25 MG PO CAPS: 25.0000 mg | ORAL_CAPSULE | ORAL | Status: DC | PRN

## 2020-03-12 MED ORDER — SIMETHICONE 80 MG PO CHEW
80.0000 mg | CHEWABLE_TABLET | ORAL | Status: DC
  Administered 2020-03-12 – 2020-03-14 (×3): 80 mg via ORAL
  Filled 2020-03-12 (×3): qty 1

## 2020-03-12 MED ORDER — FENTANYL CITRATE (PF) 100 MCG/2ML IJ SOLN: 50.0000 ug | Freq: Once | INTRAMUSCULAR | Status: DC

## 2020-03-12 MED ORDER — NALBUPHINE HCL 10 MG/ML IJ SOLN: 5.0000 mg | Freq: Once | INTRAMUSCULAR | Status: DC | PRN

## 2020-03-12 MED ORDER — ONDANSETRON HCL 4 MG/2ML IJ SOLN
INTRAMUSCULAR | Status: DC | PRN
  Administered 2020-03-12: 4 mg via INTRAVENOUS

## 2020-03-12 MED ORDER — MORPHINE SULFATE (PF) 0.5 MG/ML IJ SOLN
INTRAMUSCULAR | Status: DC | PRN
Start: 2020-03-12 — End: 2020-03-12
  Administered 2020-03-12: 3 mg via EPIDURAL

## 2020-03-12 MED ORDER — FLEET ENEMA 7-19 GM/118ML RE ENEM: 1.0000 | ENEMA | RECTAL | Status: DC | PRN

## 2020-03-12 MED ORDER — CEFAZOLIN SODIUM-DEXTROSE 2-3 GM-%(50ML) IV SOLR
INTRAVENOUS | Status: DC | PRN
  Administered 2020-03-12: 2 g via INTRAVENOUS

## 2020-03-12 MED ORDER — ACETAMINOPHEN 325 MG PO TABS: 650.0000 mg | ORAL_TABLET | ORAL | Status: DC | PRN

## 2020-03-12 MED ORDER — SUCCINYLCHOLINE CHLORIDE 20 MG/ML IJ SOLN
INTRAMUSCULAR | Status: DC | PRN
  Administered 2020-03-12: 120 mg via INTRAVENOUS

## 2020-03-12 MED ORDER — NALBUPHINE HCL 10 MG/ML IJ SOLN
5.0000 mg | INTRAMUSCULAR | Status: DC | PRN
  Administered 2020-03-12 – 2020-03-13 (×2): 5 mg via INTRAVENOUS
  Filled 2020-03-12 (×2): qty 1

## 2020-03-12 MED ORDER — TERBUTALINE SULFATE 1 MG/ML IJ SOLN
INTRAMUSCULAR | Status: AC
  Filled 2020-03-12: qty 1

## 2020-03-12 MED ORDER — OXYTOCIN BOLUS FROM INFUSION: 333.0000 mL | Freq: Once | INTRAVENOUS | Status: DC

## 2020-03-12 MED ORDER — ACETAMINOPHEN 10 MG/ML IV SOLN
INTRAVENOUS | Status: AC
  Filled 2020-03-12: qty 100

## 2020-03-12 MED ORDER — KETOROLAC TROMETHAMINE 30 MG/ML IJ SOLN
INTRAMUSCULAR | Status: AC
  Filled 2020-03-12: qty 1

## 2020-03-12 MED ORDER — IBUPROFEN 800 MG PO TABS
800.0000 mg | ORAL_TABLET | Freq: Three times a day (TID) | ORAL | Status: AC
  Administered 2020-03-12 – 2020-03-15 (×8): 800 mg via ORAL
  Filled 2020-03-12 (×9): qty 1

## 2020-03-12 MED ORDER — TERBUTALINE SULFATE 1 MG/ML IJ SOLN: 0.2500 mg | Freq: Once | INTRAMUSCULAR | Status: AC | PRN

## 2020-03-12 MED ORDER — SOD CITRATE-CITRIC ACID 500-334 MG/5ML PO SOLN: 30.0000 mL | ORAL | Status: DC | PRN

## 2020-03-12 MED ORDER — LIDOCAINE HCL (PF) 1 % IJ SOLN: 30.0000 mL | INTRAMUSCULAR | Status: DC | PRN

## 2020-03-12 MED ORDER — FENTANYL CITRATE (PF) 100 MCG/2ML IJ SOLN
INTRAMUSCULAR | Status: DC | PRN
  Administered 2020-03-12 (×2): 50 ug via INTRAVENOUS
  Administered 2020-03-12: 100 ug via INTRAVENOUS

## 2020-03-12 MED ORDER — GABAPENTIN 100 MG PO CAPS
100.0000 mg | ORAL_CAPSULE | Freq: Two times a day (BID) | ORAL | Status: DC
  Administered 2020-03-12 – 2020-03-15 (×7): 100 mg via ORAL
  Filled 2020-03-12 (×7): qty 1

## 2020-03-12 MED ORDER — FENTANYL-BUPIVACAINE-NACL 0.5-0.125-0.9 MG/250ML-% EP SOLN
12.0000 mL/h | EPIDURAL | Status: DC | PRN
  Filled 2020-03-12: qty 250

## 2020-03-12 MED ORDER — OXYTOCIN-SODIUM CHLORIDE 30-0.9 UT/500ML-% IV SOLN: 1.0000 m[IU]/min | INTRAVENOUS | Status: DC

## 2020-03-12 MED ORDER — SENNOSIDES-DOCUSATE SODIUM 8.6-50 MG PO TABS
2.0000 | ORAL_TABLET | ORAL | Status: DC
  Administered 2020-03-12 – 2020-03-14 (×3): 2 via ORAL
  Filled 2020-03-12 (×3): qty 2

## 2020-03-12 MED ORDER — TERBUTALINE SULFATE 1 MG/ML IJ SOLN
INTRAMUSCULAR | Status: AC
  Administered 2020-03-12: 0.25 mg via SUBCUTANEOUS
  Filled 2020-03-12: qty 1

## 2020-03-12 MED ORDER — OXYCODONE HCL 5 MG PO TABS
5.0000 mg | ORAL_TABLET | ORAL | Status: DC | PRN
  Administered 2020-03-12 – 2020-03-15 (×5): 5 mg via ORAL
  Filled 2020-03-12 (×5): qty 1

## 2020-03-12 MED ORDER — PROPOFOL 10 MG/ML IV BOLUS
INTRAVENOUS | Status: DC | PRN
  Administered 2020-03-12: 40 mg via INTRAVENOUS
  Administered 2020-03-12: 160 mg via INTRAVENOUS

## 2020-03-12 MED ORDER — SIMETHICONE 80 MG PO CHEW: 80.0000 mg | CHEWABLE_TABLET | ORAL | Status: DC | PRN

## 2020-03-12 MED ORDER — DIBUCAINE (PERIANAL) 1 % EX OINT: 1.0000 "application " | TOPICAL_OINTMENT | CUTANEOUS | Status: DC | PRN

## 2020-03-12 MED ORDER — TETANUS-DIPHTH-ACELL PERTUSSIS 5-2.5-18.5 LF-MCG/0.5 IM SUSY: 0.5000 mL | PREFILLED_SYRINGE | Freq: Once | INTRAMUSCULAR | Status: DC

## 2020-03-12 MED ORDER — WITCH HAZEL-GLYCERIN EX PADS: 1.0000 "application " | MEDICATED_PAD | CUTANEOUS | Status: DC | PRN

## 2020-03-12 MED ORDER — OXYTOCIN-SODIUM CHLORIDE 30-0.9 UT/500ML-% IV SOLN
INTRAVENOUS | Status: DC | PRN
  Administered 2020-03-12: 100 mL via INTRAVENOUS
  Administered 2020-03-12: 300 mL via INTRAVENOUS

## 2020-03-12 MED ORDER — SIMETHICONE 80 MG PO CHEW
80.0000 mg | CHEWABLE_TABLET | Freq: Three times a day (TID) | ORAL | Status: DC
  Administered 2020-03-12 – 2020-03-15 (×10): 80 mg via ORAL
  Filled 2020-03-12 (×10): qty 1

## 2020-03-12 MED ORDER — LACTATED RINGERS IV SOLN: 500.0000 mL | Freq: Once | INTRAVENOUS | Status: DC

## 2020-03-12 MED ORDER — EPHEDRINE 5 MG/ML INJ: 10.0000 mg | INTRAVENOUS | Status: DC | PRN

## 2020-03-12 MED ORDER — ALBUMIN HUMAN 5 % IV SOLN: INTRAVENOUS | Status: DC | PRN

## 2020-03-12 MED ORDER — ENOXAPARIN SODIUM 40 MG/0.4ML ~~LOC~~ SOLN
40.0000 mg | SUBCUTANEOUS | Status: DC
  Administered 2020-03-13 – 2020-03-15 (×3): 40 mg via SUBCUTANEOUS
  Filled 2020-03-12 (×3): qty 0.4

## 2020-03-12 MED ORDER — ONDANSETRON HCL 4 MG/2ML IJ SOLN: 4.0000 mg | Freq: Three times a day (TID) | INTRAMUSCULAR | Status: DC | PRN

## 2020-03-12 MED ORDER — SODIUM CHLORIDE 0.9% FLUSH: 3.0000 mL | INTRAVENOUS | Status: DC | PRN

## 2020-03-12 MED ORDER — NALOXONE HCL 0.4 MG/ML IJ SOLN: 0.4000 mg | INTRAMUSCULAR | Status: DC | PRN

## 2020-03-12 MED ORDER — STERILE WATER FOR IRRIGATION IR SOLN
Status: DC | PRN
  Administered 2020-03-12: 1000 mL

## 2020-03-12 MED ORDER — DIPHENHYDRAMINE HCL 25 MG PO CAPS
25.0000 mg | ORAL_CAPSULE | Freq: Four times a day (QID) | ORAL | Status: DC | PRN
  Administered 2020-03-12: 25 mg via ORAL
  Filled 2020-03-12: qty 1

## 2020-03-12 MED ORDER — OXYTOCIN-SODIUM CHLORIDE 30-0.9 UT/500ML-% IV SOLN
2.5000 [IU]/h | INTRAVENOUS | Status: AC
  Administered 2020-03-12: 2.5 [IU]/h via INTRAVENOUS
  Filled 2020-03-12: qty 500

## 2020-03-12 MED ORDER — MIDAZOLAM HCL 2 MG/2ML IJ SOLN
INTRAMUSCULAR | Status: DC | PRN
  Administered 2020-03-12: 1 mg via INTRAVENOUS

## 2020-03-12 MED ORDER — KETOROLAC TROMETHAMINE 30 MG/ML IJ SOLN
30.0000 mg | Freq: Once | INTRAMUSCULAR | Status: AC | PRN
  Administered 2020-03-12: 30 mg via INTRAVENOUS

## 2020-03-12 MED ORDER — OXYCODONE-ACETAMINOPHEN 5-325 MG PO TABS: 2.0000 | ORAL_TABLET | ORAL | Status: DC | PRN

## 2020-03-12 MED ORDER — SODIUM CHLORIDE 0.9 % IR SOLN
Status: DC | PRN
  Administered 2020-03-12: 1000 mL

## 2020-03-12 MED ORDER — LIDOCAINE HCL (PF) 1 % IJ SOLN
INTRAMUSCULAR | Status: DC | PRN
  Administered 2020-03-12: 7 mL via EPIDURAL

## 2020-03-12 MED ORDER — LIDOCAINE HCL (PF) 1 % IJ SOLN
30.0000 mL | INTRAMUSCULAR | Status: DC | PRN
  Filled 2020-03-12: qty 30

## 2020-03-12 SURGICAL SUPPLY — 32 items
BENZOIN TINCTURE PRP APPL 2/3 (GAUZE/BANDAGES/DRESSINGS) ×2 IMPLANT
CLOSURE STERI STRIP 1/2 X4 (GAUZE/BANDAGES/DRESSINGS) ×2 IMPLANT
CLOTH BEACON ORANGE TIMEOUT ST (SAFETY) ×2 IMPLANT
DRSG OPSITE POSTOP 4X10 (GAUZE/BANDAGES/DRESSINGS) ×2 IMPLANT
ELECT REM PT RETURN 9FT ADLT (ELECTROSURGICAL) ×2
ELECTRODE REM PT RTRN 9FT ADLT (ELECTROSURGICAL) ×1 IMPLANT
EXTRACTOR VACUUM KIWI (MISCELLANEOUS) IMPLANT
GAUZE SPONGE 4X4 12PLY STRL LF (GAUZE/BANDAGES/DRESSINGS) ×2 IMPLANT
GLOVE BIOGEL PI IND STRL 7.0 (GLOVE) ×1 IMPLANT
GLOVE BIOGEL PI INDICATOR 7.0 (GLOVE) ×1
GLOVE SURG ORTHO 8.0 STRL STRW (GLOVE) ×2 IMPLANT
GOWN STRL REUS W/TWL LRG LVL3 (GOWN DISPOSABLE) ×4 IMPLANT
HEMOSTAT ARISTA ABSORB 3G PWDR (HEMOSTASIS) ×2 IMPLANT
KIT ABG SYR 3ML LUER SLIP (SYRINGE) IMPLANT
NEEDLE HYPO 25X5/8 SAFETYGLIDE (NEEDLE) IMPLANT
NS IRRIG 1000ML POUR BTL (IV SOLUTION) ×2 IMPLANT
PACK C SECTION WH (CUSTOM PROCEDURE TRAY) ×2 IMPLANT
PAD ABD 7.5X8 STRL (GAUZE/BANDAGES/DRESSINGS) ×2 IMPLANT
PAD OB MATERNITY 4.3X12.25 (PERSONAL CARE ITEMS) ×2 IMPLANT
PENCIL SMOKE EVAC W/HOLSTER (ELECTROSURGICAL) ×2 IMPLANT
RTRCTR C-SECT PINK 25CM LRG (MISCELLANEOUS) IMPLANT
STRIP CLOSURE SKIN 1/2X4 (GAUZE/BANDAGES/DRESSINGS) IMPLANT
SUT MON AB-0 CT1 36 (SUTURE) ×4 IMPLANT
SUT PLAIN 0 NONE (SUTURE) IMPLANT
SUT VIC AB 0 CT1 27 (SUTURE) ×2
SUT VIC AB 0 CT1 27XBRD ANBCTR (SUTURE) ×2 IMPLANT
SUT VIC AB 2-0 CT1 27 (SUTURE) ×1
SUT VIC AB 2-0 CT1 TAPERPNT 27 (SUTURE) ×1 IMPLANT
SUT VIC AB 4-0 KS 27 (SUTURE) ×2 IMPLANT
TOWEL OR 17X24 6PK STRL BLUE (TOWEL DISPOSABLE) ×2 IMPLANT
TRAY FOLEY W/BAG SLVR 14FR LF (SET/KITS/TRAYS/PACK) ×2 IMPLANT
WATER STERILE IRR 1000ML POUR (IV SOLUTION) ×2 IMPLANT

## 2020-03-12 NOTE — Progress Notes (Signed)
Dr Larita Fife notified of pt's admission and status. Aware of sve, SROM cl fld at 0400, ctx pattern. Will admit to [facility]

## 2020-03-12 NOTE — Transfer of Care (Signed)
Immediate Anesthesia Transfer of Care Note  Patient: Jamie Neal  Procedure(s) Performed: CESAREAN SECTION  Patient Location: PACU  Anesthesia Type:General and Epidural  Level of Consciousness: awake  Airway & Oxygen Therapy: Patient Spontanous Breathing and Patient connected to nasal cannula oxygen  Post-op Assessment: Report given to RN and Post -op Vital signs reviewed and stable  Post vital signs: Reviewed and stable  Last Vitals:  Vitals Value Taken Time  BP 102/50 03/12/20 0939  Temp    Pulse 94 03/12/20 0942  Resp 35 03/12/20 0942  SpO2 100 % 03/12/20 0942  Vitals shown include unvalidated device data.  Last Pain:  Vitals:   03/12/20 0658  TempSrc:   PainSc: 10-Worst pain ever         Complications: No complications documented.

## 2020-03-12 NOTE — Anesthesia Preprocedure Evaluation (Addendum)
Anesthesia Evaluation  Patient identified by MRN, date of birth, ID band Patient awake    Reviewed: Patient's Chart, lab work & pertinent test results  Airway Mallampati: II  TM Distance: >3 FB Neck ROM: Full    Dental  (+) Teeth Intact   Pulmonary neg pulmonary ROS,    Pulmonary exam normal        Cardiovascular negative cardio ROS   Rhythm:Regular Rate:Normal     Neuro/Psych negative neurological ROS  negative psych ROS   GI/Hepatic negative GI ROS, Neg liver ROS,   Endo/Other  negative endocrine ROS  Renal/GU negative Renal ROS  negative genitourinary   Musculoskeletal negative musculoskeletal ROS (+)   Abdominal (+)  Abdomen: soft. Bowel sounds: normal.  Peds  Hematology negative hematology ROS (+)   Anesthesia Other Findings   Reproductive/Obstetrics (+) Pregnancy                             Anesthesia Physical Anesthesia Plan  ASA: II  Anesthesia Plan: Epidural   Post-op Pain Management:    Induction:   PONV Risk Score and Plan: 2  Airway Management Planned:   Additional Equipment: None  Intra-op Plan:   Post-operative Plan:   Informed Consent: I have reviewed the patients History and Physical, chart, labs and discussed the procedure including the risks, benefits and alternatives for the proposed anesthesia with the patient or authorized representative who has indicated his/her understanding and acceptance.     Dental advisory given  Plan Discussed with:   Anesthesia Plan Comments: (NOTE: Code C/S for decelerations called 0800. Given emergent nature of delivery, RSI general anesthesia performed.    Last CBC Lab Results      Component                Value               Date                      WBC                      8.2                 03/12/2020                HGB                      10.4 (L)            03/12/2020                HCT                       33.4 (L)            03/12/2020                MCV                      85.6                03/12/2020                MCH                      26.7  03/12/2020                RDW                      14.9                03/12/2020                PLT                      177                 03/12/2020          )       Anesthesia Quick Evaluation

## 2020-03-12 NOTE — Anesthesia Procedure Notes (Signed)
Epidural Patient location during procedure: OB Start time: 03/12/2020 7:25 AM End time: 03/12/2020 7:29 AM  Staffing Anesthesiologist: Atilano Median, DO Performed: anesthesiologist   Preanesthetic Checklist Completed: patient identified, IV checked, site marked, risks and benefits discussed, surgical consent, monitors and equipment checked, pre-op evaluation and timeout performed  Epidural Patient position: sitting Prep: ChloraPrep Patient monitoring: heart rate, continuous pulse ox and blood pressure Approach: midline Location: L3-L4 Injection technique: LOR saline  Needle:  Needle type: Tuohy  Needle gauge: 17 G Needle length: 9 cm Needle insertion depth: 8 cm Catheter type: closed end flexible Catheter size: 20 Guage Catheter at skin depth: 12 cm Test dose: negative and 1.5% lidocaine  Assessment Events: blood not aspirated, injection not painful, no injection resistance and no paresthesia  Additional Notes  Patient identified. Risks/Benefits/Options discussed with patient including but not limited to bleeding, infection, nerve damage, paralysis, failed block, incomplete pain control, headache, blood pressure changes, nausea, vomiting, reactions to medications, itching and postpartum back pain. Confirmed with bedside nurse the patient's most recent platelet count. Confirmed with patient that they are not currently taking any anticoagulation, have any bleeding history or any family history of bleeding disorders. Patient expressed understanding and wished to proceed. All questions were answered. Sterile technique was used throughout the entire procedure. Please see nursing notes for vital signs. Test dose was given through epidural catheter and negative prior to continuing to dose epidural or start infusion. Warning signs of high block given to the patient including shortness of breath, tingling/numbness in hands, complete motor block, or any concerning symptoms with  instructions to call for help. Patient was given instructions on fall risk and not to get out of bed. All questions and concerns addressed with instructions to call with any issues or inadequate analgesia.    Reason for block:procedure for pain

## 2020-03-12 NOTE — Social Work (Signed)
CSW received consult for late and limited PNC/drug exposed infant.  CSW reviewed chart and is screening out consult as it does not meet criteria for automatic CSW involvement and infant drug screening.  MOB started care prior to 28 weeks and had more than 3 visits.   Upon chart review, CSW notes five total prenatal visit. MOB initial prenatal care started at [redacted]w[redacted]d. MOB had an additional 4 visits.   Please contact the Clinical Social Worker if needs arise, by MOB request, or if MOB scores greater than 9/yes to question 10 on Edinburgh Postpartum Depression Screen.  Maleko Greulich, LCSWA Clinical Social Work [facility]  [phone] 

## 2020-03-12 NOTE — Anesthesia Postprocedure Evaluation (Addendum)
Anesthesia Post Note  Patient: Jamie Neal  Procedure(s) Performed: CESAREAN SECTION     Patient location during evaluation: Mother Baby Anesthesia Type: Epidural and General Level of consciousness: awake and alert Pain management: pain level controlled Vital Signs Assessment: post-procedure vital signs reviewed and stable Respiratory status: spontaneous breathing, nonlabored ventilation and respiratory function stable Cardiovascular status: stable Postop Assessment: no headache, no backache and epidural receding Anesthetic complications: no Comments: STAT C/S called for decelerations shortly after epidural placement. GA performed given emergent nature of procedure, epidural utilized intraoperatively for post-operative pain relief.    No complications documented.  Last Vitals:  Vitals:   03/12/20 1213 03/12/20 1346  BP: 110/72 115/79  Pulse: 85 86  Resp: 18 17  Temp: 36.7 C 36.9 C  SpO2: 98% 100%    Last Pain:  Vitals:   03/12/20 1346  TempSrc: Oral  PainSc: 3    Pain Goal: Patients Stated Pain Goal: 7 (03/12/20 1346)                 MULLINS,JANET

## 2020-03-12 NOTE — H&P (Addendum)
OBSTETRIC ADMISSION HISTORY AND PHYSICAL  Jamie Neal is a 24 y.o. female G2P1001 with IUP at 53w5dby 10.6w [place] for SOL. She reports +FMs, No LOF, no VB, no blurry vision, headaches or peripheral edema, and RUQ pain.  She plans on bottle feeding. She request POPs for birth control. 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, [facility], second and third trimes[facility] Obesity  Past Medical History: Past Medical History:  Diagnosis Date   Medical history non-contributory     Past Surgical History: Past Surgical History:  Procedure Laterality Date   NO PAST SURGERIES      Obstetrical History: OB History     Gravida  2   Para  1   Term  1   Preterm      AB      Living  1      SAB      TAB      Ectopic      Multiple      Live Births  1           Social History Social History   Socioeconomic History   Marital status: Single    Spouse name: Not on file   Number of children: Not on file   Years of education: Not on file   Highest education level: Not on file  Occupational History   Not on file  Tobacco Use   Smoking status: Never Smoker   Smokeless tobacco: Never Used  Vaping Use   Vaping Use: Never used  Substance and Sexual Activity   Alcohol use: Not Currently    Comment: occasionally   Drug use: Never   Sexual activity: Yes    Birth control/protection: None  Other Topics Concern   Not on file  Social History Narrative   Not on file   Social Determinants of Health   Financial Resource Strain:    Difficulty of Paying Living Expenses: Not on file  Food Insecurity: No Food Insecurity   Worried About Running Out of Food in the Last Year: Never true   Ran Out of Food in the Last  Year: Never true  Transportation Needs: No Transportation Needs   Lack of Transportation (Medical): No   Lack of Transportation (Non-Medical): No  Physical Activity:    Days of Exercise per Week: Not on file   Minutes of Exercise per Session: Not on file  Stress:    Feeling of Stress : Not on file  Social Connections:    Frequency of Communication with Friends and Family: Not on file   Frequency of Social Gatherings with Friends and Family: Not on file   Attends Religious Services: Not on file   Active Member of Clubs or Organizations: Not on file   Attends CArchivistMeetings: Not on file   Marital Status: Not on file    Family History: Family History  Problem Relation Age of Onset   Hypertension Father     Allergies: No Known Allergies  Medications Prior to Admission  Medication Sig Dispense Refill Last Dose   Prenatal MV-Min-FA-Omega-3 (PRENATAL GUMMIES/DHA & FA PO) Take 2 tablets by mouth daily. (Patient not taking: Reported on 03/10/2020)      Prenatal Vit-Fe Fumarate-FA (  MULTIVITAMIN-PRENATAL) 27-0.8 MG TABS tablet Take 1 tablet by mouth daily at 12 noon.      valACYclovir (VALTREX) 500 MG tablet Take 1 tablet (500 mg total) by mouth 2 (two) times daily. 60 tablet 0      Review of Systems   All systems reviewed and negative except as stated in HPI  Blood pressure 104/62, pulse 99, temperature 98.1 F (36.7 C), temperature source Oral, resp. rate 18, last menstrual period 06/06/2019, SpO2 100 %. General appearance: alert, cooperative and appears stated age, moderate distress Lungs: normal respiratory effort, no respiratory distress Heart: regular rate  Abdomen: soft, non-tender Presentation: cephalic Fetal monitoringBaseline: 140 bpm, Variability: Good {> 6 bpm), Accelerations: Reactive and Decelerations: Variable: moderate Uterine activityFrequency: Every 1-2 minutes Dilation: 7 Effacement (%): 90 Station: 0 Exam by:: Raina Mina, RN   Prenatal  labs: ABO, Rh: --/--/O POS (11/12 0530) Antibody: NEG (11/12 0530) Rubella: 2.52 (07/06 1202) RPR: Non Reactive (10/19 0905)  HBsAg: Negative (07/06 1202)  HIV: Non Reactive (10/19 0905)  GBS: Negative/-- (10/19 1145)  2 hr Glucola passed, glucose 108 Genetic screening  NIPS: low risk, female; AFP: negative Anatomy US normal  Prenatal Transfer Tool  Maternal Diabetes: No Genetic Screening: Normal Maternal Ultrasounds/Referrals: Normal Fetal Ultrasounds or other Referrals:  None Maternal Substance Abuse:  No Significant Maternal Medications:  Meds include: Other: Valacyclovir Significant Maternal Lab Results: Group B Strep negative  Results for orders placed or performed during the hospital encounter of 03/12/20 (from the past 24 hour(s))  Type and screen   Collection Time: 03/12/20  5:30 AM  Result Value Ref Range   ABO/RH(D) O POS    Antibody Screen NEG    Sample Expiration      03/15/2020,2359 Performed at [facility], [place] [address], [place], [place] [address]   CBC   Collection Time: 03/12/20  5:32 AM  Result Value Ref Range   WBC 8.2 4.0 - 10.5 K/uL   RBC 3.90 3.87 - 5.11 MIL/uL   Hemoglobin 10.4 (L) 12.0 - 15.0 g/dL   HCT 33.4 (L) 36 - 46 %   MCV 85.6 80.0 - 100.0 fL   MCH 26.7 26.0 - 34.0 pg   MCHC 31.1 30.0 - 36.0 g/dL   RDW 14.9 11.5 - 15.5 %   Platelets 177 150 - 400 K/uL   nRBC 0.0 0.0 - 0.2 %    Patient Active Problem List   Diagnosis Date Noted   Encounter for induction of labor 03/12/2020   [redacted] weeks gestation of pregnancy 03/03/2020   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 G2P1001 at 80w5dhere for SOL.   #Labor: SOL. Terb x1 for recurrent variables.  #Pain: Requesting  epidural #FWB: Cat 2 - recurrent variables despite position changes. 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 I developed and verified the management plan that is described in the resident's/student's note, and  I agree with the content with my edits above. VSS, HRR&R, Resp unlabored, Legs neg.  Nigel Berthold, CNM 03/12/2020 8:37 AM

## 2020-03-12 NOTE — MAU Note (Signed)
.   Jamie Neal is a 24 y.o. at [redacted]w[redacted]d here in MAU via EMS with complaints of SROM at 4am. Contractions every 2-3 mins. +FM  Onset of complaint: 4am  Pain score: 10 Vitals:   03/12/20 0455  BP: 119/74  Pulse: 95  Resp: 16  Temp: 97.7 F (36.5 C)  SpO2: 100%     FHT:125 Lab orders placed from triage:

## 2020-03-12 NOTE — Op Note (Signed)
[name] PROCEDURE DATE: 03/12/2020  PREOPERATIVE DIAGNOSES: Intrauterine pregnancy at [redacted]w[redacted]d weeks gestation; non-reassuring fetal status  POSTOPERATIVE DIAGNOSES: The same  PROCEDURE: Primary Low Transverse Cesarean Section  SURGEON:  Dr. Mariel Aloe  ASSISTANT:  Fayette Pho, MD  ANESTHESIOLOGY TEAM: Anesthesiologist: Nance Pew Nelle Don, DO CRNA: Renford Dills, CRNA; Angela Adam, CRNA  INDICATIONS: Jamie Neal is a 24 y.o. [phone] at [redacted]w[redacted]d here for cesarean section secondary to the indications listed under preoperative diagnoses; please see preoperative note for further details. Upon my arrival to the room the patient was in knee chest position due to fetal bradycardia.  I spoke with the midwife and the Family practice resident.  I was told the baby had been down for approximately 10 minutes and the cervix was essentially unchanged from the previous check.  Cervix was 8-9 at 0 station.  Decision was made for emergent cesarean section.   The risks of surgery were discussed with the patient including but were not limited to: bleeding which may require transfusion or reoperation; infection which may require antibiotics; injury to bowel, bladder, ureters or other surrounding organs; injury to the fetus; need for additional procedures including hysterectomy in the event of a life-threatening hemorrhage; formation of adhesions; placental abnormalities wth subsequent pregnancies; incisional problems; thromboembolic phenomenon and other postoperative/anesthesia complications.  The patient concurred with the proposed plan, giving informed written consent for the procedure.    FINDINGS:  Viable female infant in cephalic presentation.  Apgars 9 and 10.  Clear amniotic fluid.  Intact placenta, three vessel cord.  Normal uterus, fallopian tubes and ovaries bilaterally.  ANESTHESIA: general INTRAVENOUS FLUIDS: 2200 ml   ESTIMATED BLOOD LOSS: 800 ml Albumin 250 ml URINE OUTPUT:  75  ml SPECIMENS: Placenta sent to pathology COMPLICATIONS: None immediate  PROCEDURE IN DETAIL:  Due to the acuity of the situation, the patient was rapid prepped in the OR.  Foley catheter was placed and general anesthesia was induced. She was then placed in a dorsal supine position with a leftward tilt, and prepped and draped in a sterile manner.  A foley catheter was placed into her bladder and attached to constant gravity.  After an adequate timeout was performed, a Pfannenstiel skin incision was made with scalpel two fingerbreaths above the pubic and carried through to the underlying layer of fascia. The fascia was incised in the midline, and this incision was extended bilaterally using the Mayo scissors.  Kocher clamps x 2 were applied to the superior aspect of the fascial incision and the underlying rectus muscles were dissected off bluntly and sharply.  A similar process was carried out on the inferior aspect of the fascial incision. The rectus muscles were separated in the midline and the peritoneum was entered bluntly. A bladder blade was placed and  a low transverse hysterotomy was made with a scalpel and extended bilaterally bluntly.  The infant was successfully delivered, the cord was clamped and cut after one minute, and the infant was handed over to the awaiting neonatology team. Uterine massage was then administered, and the placenta delivered intact with a three-vessel cord. The uterus was then cleared of clots and debris using manual curettage.  At this point and Alexis self retaining retractor was placed.  The uterine incision was closed with 0 monocryl in a running locked fashion, and an imbricating layer was also placed with 0 monocryl.   The pelvis was cleared of all clot and debris with irrigation and suction. Hemostasis was confirmed on all surfaces.  The retractor was removed.  The peritoneum was closed with a 2-0 Vicryl running stitch.  The muscle bodies were examined and hemostasis was  achieved with cautery and suture ligation. The fascia was then closed using 0 Vicryl in a running fashion.  The subcutaneous layer was irrigated, and reapproximated with 2-0 vicryl interrupted stitches, and the skin was closed with a 4-0 Vicryl subcuticular stitch. The patient tolerated the procedure well. Sponge, instrument and needle counts were correct x 3.  She was taken to the recovery room in stable condition.    Mariel Aloe, MD, FACOG Obstetrician & Gynecologist, [facility] for [facility], [facility]

## 2020-03-12 NOTE — Anesthesia Procedure Notes (Signed)
Procedure Name: Intubation Date/Time: 03/12/2020 8:07 AM Performed by: Angela Adam, CRNA Pre-anesthesia Checklist: Patient identified, Emergency Drugs available, Suction available and Patient being monitored Patient Re-evaluated:Patient Re-evaluated prior to induction Oxygen Delivery Method: Circle system utilized Preoxygenation: Pre-oxygenation with 100% oxygen Induction Type: IV induction and Rapid sequence Laryngoscope Size: Miller and 2 Grade View: Grade II Tube type: Oral Tube size: 7.0 mm Number of attempts: 1 Airway Equipment and Method: Stylet and Oral airway Placement Confirmation: ETT inserted through vocal cords under direct vision,  positive ETCO2 and breath sounds checked- equal and bilateral Tube secured with: Tape Dental Injury: Teeth and Oropharynx as per pre-operative assessment

## 2020-03-13 ENCOUNTER — Encounter (HOSPITAL_COMMUNITY): Payer: Self-pay | Admitting: Obstetrics and Gynecology

## 2020-03-13 LAB — CBC
HCT: 22.3 % — ABNORMAL LOW (ref 36.0–46.0)
Hemoglobin: 7.3 g/dL — ABNORMAL LOW (ref 12.0–15.0)
MCH: 27.9 pg (ref 26.0–34.0)
MCHC: 32.7 g/dL (ref 30.0–36.0)
MCV: 85.1 fL (ref 80.0–100.0)
Platelets: 143 10*3/uL — ABNORMAL LOW (ref 150–400)
RBC: 2.62 MIL/uL — ABNORMAL LOW (ref 3.87–5.11)
RDW: 14.8 % (ref 11.5–15.5)
WBC: 9.9 10*3/uL (ref 4.0–10.5)
nRBC: 0 % (ref 0.0–0.2)

## 2020-03-13 NOTE — Progress Notes (Signed)
Subjective: POD#1 stat pLTCS NRFHT  Patient is doing well without complaints. Ambulating without difficulty. Voiding and passing flatus. Tolerating PO. Abdominal pain improved. Vaginal bleeding decreased.  Objective: Vital signs in last 24 hours: Temp:  [96.4 F (35.8 C)-98.8 F (37.1 C)] 98.5 F (36.9 C) (11/13 0637) Pulse Rate:  [70-97] 80 (11/13 0637) Resp:  [17-24] 17 (11/13 0637) BP: (90-117)/(58-83) 108/83 (11/13 0637) SpO2:  [98 %-100 %] 100 % (11/13 0388)  Physical Exam:  General: alert, cooperative and no distress Lochia: appropriate Uterine Fundus: firm Incision: honeycomb dressing c/d/i, minimal blood present DVT Evaluation: No evidence of DVT seen on physical exam.  Recent Labs    03/12/20 0532 03/13/20 0654  HGB 10.4* 7.3*  HCT 33.4* 22.3*    Assessment/Plan: POD#1 stat pLTCS NRFHT  -doing well, meeting pp milestones  -VSS  -desires circ for baby, ordered/consented  -discussed contraception, undecided  -bottle feeding  #Limited/late PNC  -SW screened out given # of appts  Plan for discharge POD#2 or POD#3.  Alric Seton 03/13/2020, 10:03 AM

## 2020-03-14 ENCOUNTER — Inpatient Hospital Stay (HOSPITAL_COMMUNITY): Admission: AD | Admit: 2020-03-14 | Payer: Medicaid Other | Source: Home / Self Care | Admitting: Family Medicine

## 2020-03-14 ENCOUNTER — Inpatient Hospital Stay (HOSPITAL_COMMUNITY): Payer: Medicaid Other

## 2020-03-14 MED ORDER — SODIUM CHLORIDE 0.9 % IV SOLN
500.0000 mg | Freq: Once | INTRAVENOUS | Status: AC
  Administered 2020-03-14: 500 mg via INTRAVENOUS
  Filled 2020-03-14: qty 25

## 2020-03-14 MED ORDER — SODIUM CHLORIDE 0.9 % IV SOLN: 300.0000 mg | Freq: Once | INTRAVENOUS | Status: DC

## 2020-03-14 MED ORDER — FERROUS SULFATE 325 (65 FE) MG PO TABS: 325.0000 mg | ORAL_TABLET | ORAL | Status: DC

## 2020-03-14 NOTE — Progress Notes (Signed)
Subjective: POD#2 stat pLTCS NRFHT  Patient is doing well without complaints. Ambulating without difficulty. Voiding and passing flatus. Has had a bowel movement. Tolerating PO. Abdominal pain stable. Vaginal bleeding decreased. Reports worried about going home - has no one at home to help. Hoping someone will fly down to help out.   Objective: Vital signs in last 24 hours: Temp:  [98.2 F (36.8 C)-98.5 F (36.9 C)] 98.2 F (36.8 C) (11/14 0505) Pulse Rate:  [80-92] 85 (11/14 0505) Resp:  [15-17] 16 (11/14 0505) BP: (94-111)/(59-83) 94/59 (11/14 0505) SpO2:  [96 %-100 %] 100 % (11/14 0505)  Physical Exam:  General: alert, cooperative and no distress Lochia: appropriate Uterine Fundus: firm below umbilicus  Incision: honeycomb dressing c/d/i, minimal blood present DVT Evaluation: No evidence of DVT seen on physical exam.  Recent Labs    03/12/20 0532 03/13/20 0654  HGB 10.4* 7.3*  HCT 33.4* 22.3*    Assessment/Plan: POD#1 stat pLTCS NRFHT  -doing well, meeting pp milestones   -acute blood loss anemia: hgb 10.4 > 7.3. Give venofer x1 followed by PO iron every other day   -VSS  -discussed contraception on POD#1, undecided  -bottle feeding  #Limited/late PNC  -SW screened out given # of appts   Plan for discharge on POD#3.  Gita Kudo 03/14/2020, 6:17 AM

## 2020-03-15 ENCOUNTER — Encounter (HOSPITAL_COMMUNITY): Payer: Self-pay | Admitting: Obstetrics and Gynecology

## 2020-03-15 ENCOUNTER — Other Ambulatory Visit (HOSPITAL_COMMUNITY): Payer: Self-pay | Admitting: Obstetrics and Gynecology

## 2020-03-15 LAB — SURGICAL PATHOLOGY

## 2020-03-15 MED ORDER — IBUPROFEN 800 MG PO TABS: 800.0000 mg | ORAL_TABLET | Freq: Three times a day (TID) | ORAL | 0 refills | Status: DC

## 2020-03-15 MED ORDER — OXYCODONE HCL 5 MG PO TABS
5.0000 mg | ORAL_TABLET | ORAL | 0 refills | Status: DC | PRN
Start: 2020-03-15 — End: 2020-03-15

## 2020-03-15 MED ORDER — ACETAMINOPHEN 325 MG PO TABS: 650.0000 mg | ORAL_TABLET | ORAL | 0 refills | Status: DC | PRN

## 2020-03-15 MED FILL — oxyCODONE HCL 5 MG TABS: 5 | 3 days supply | Qty: 30 | Fill #0

## 2020-03-15 MED FILL — IBUPROFEN 800 MG TAB: 800 | 10 days supply | Qty: 30 | Fill #0

## 2020-03-15 MED FILL — ACETAMINOPHEN 325 MG TABS: 325 | 5 days supply | Qty: 30 | Fill #0

## 2020-03-15 NOTE — Addendum Note (Signed)
Addendum  created 03/15/20 1350 by Woodford Strege, Nelle Don, DO   Intraprocedure Event edited, Intraprocedure Staff edited

## 2020-03-15 NOTE — Addendum Note (Signed)
Addendum  created 03/15/20 1250 by Atilano Median, DO   Intraprocedure Event edited

## 2020-03-15 NOTE — Social Work (Signed)
CSW received consult to assist with resources. CSW met with MOB to assess and offer supports. CSW observed MOB sitting in chair and newborn Jamie Neal in bassinet. CSW introduced self and role. MOB shared she moved to [place] from [place] a few years ago. MOB stated she is not familiar with the area, which is why she had a gap in prenatal care. MOB expressed she was not sure which provider to choose. MOB stated she does not have any mental health diagnosis. CSW asked MOB how her pregnancy went overall. MOB disclosed she had an emotional pregnancy. MOB stated towards 38 weeks, the pregnancy became lonely and she felt anxious. MOB disclosed FOB has not been as involved as he should be, but he has expressed he will be going forward. MOB identified her support as her Mom, who lives in [place]. MOB reported she does not have local support. MOB stated she is feeling excited and happy now that newborn is here. MOB was observed appropriately interacting with newborn, feeding him when he cried. MOB appeared to be in good spirits, smiling and laughing during the assessment. MOB denies any current SI, HI, or being involved in any DV.   CSW provided education regarding the baby blues period vs. perinatal mood disorders, discussed treatment and gave resources for mental health follow up if concerns arise.  CSW recommends self-evaluation during the postpartum time period using the New Mom Checklist from Postpartum Progress and encouraged MOB to contact a medical professional if symptoms are noted at any time. MOB expressed interest in mental health resources and therapy. CSW provided MOB with information on [facility], which can be utilized as an additional supprot. MOB expressed interest and is in agreement to a referral for [facility].   CSW provided review of Sudden Infant Death Syndrome (SIDS) precautions.  MOB stated newborn will sleep in a bassinet and packn'play. MOB stated she also has a crib. MOB is still  choosing a pediatrician, expressing interest in [facility] and [facility]. MOB expressed she has all of the essential needs for newborn, including a carseat. MOB denied having any barriers to follow-up care.   CSW completed referral for [facility]. CSW identifies no further need for intervention and no barriers to discharge at this time.  Jamie Neal, [employer] [phone]

## 2020-03-15 NOTE — Discharge Summary (Signed)
Postpartum Discharge Summary      Patient Name: Jamie Neal DOB: 1996/01/19 MRN: [id number]  Date of admission: 03/12/2020 Delivery date:03/12/2020  Delivering provider: Griffin Basil  Date of discharge: 03/15/2020  Admitting diagnosis: Encounter for induction of labor [Z34.90] Pregnancy [Z34.90] Intrauterine pregnancy: [redacted]w[redacted]d    Secondary diagnosis:  Active Problems:   Encounter for induction of labor   Pregnancy  Additional problems: Acute Blood Loss Anemia>>Venofer infusion    Discharge diagnosis: Term Pregnancy Delivered                                              Post partum procedures:Venofer infusion Augmentation: N/A Complications: None  Hospital course: Onset of Labor With Unplanned C/S   24y.o. yo [id number] admitted in Latent Labor on 03/12/2020. Patient had a labor course significant for non-reassuring fetal status. The patient went for cesarean section due to Non-Reassuring FHR. Delivery details as follows: Membrane Rupture Time/Date: 4:00 AM ,03/12/2020   Delivery Method:C-Section, Low Transverse  Details of operation can be found in separate operative note. Patient had an uncomplicated postpartum course.  She is ambulating,tolerating a regular diet, passing flatus, and urinating well.  Patient is discharged home in stable condition 03/15/20.  Newborn Data: Birth date:03/12/2020  Birth time:8:09 AM  Gender:Female  Living status:Living  Apgars:9 ,10  Weight:4220 g   Magnesium Sulfate received: No BMZ received: No Rhophylac:N/A MMR:N/A T-DaP:declined Flu: No - declined Transfusion:No >Venofer received  Physical exam  Vitals:   03/14/20 0505 03/14/20 1334 03/14/20 2104 03/15/20 0449  BP: (!) 94/59 105/68 95/60 111/85  Pulse: 85 88 96 80  Resp: 16 17 16 16   Temp: 98.2 F (36.8 C) 98.5 F (36.9 C) 98.3 F (36.8 C) 97.7 F (36.5 C)  TempSrc: Oral Oral Oral Oral  SpO2: 100%  100% 99%  Weight:       General: alert, cooperative  and no distress Lochia: appropriate Uterine Fundus: firm Incision: Healing well with no significant drainage, No significant erythema, Some dried blood staining on Honey comb dressing DVT Evaluation: No evidence of DVT seen on physical exam. Negative Homan's sign. No cords or calf tenderness. No significant calf/ankle edema. Labs: Lab Results  Component Value Date   WBC 9.9 03/13/2020   HGB 7.3 (L) 03/13/2020   HCT 22.3 (L) 03/13/2020   MCV 85.1 03/13/2020   PLT 143 (L) 03/13/2020   CMP Latest Ref Rng & Units 03/12/2020  Glucose 70 - 99 mg/dL -  BUN 6 - 20 mg/dL -  Creatinine 0.44 - 1.00 mg/dL 0.64  Sodium 135 - 145 mmol/L -  Potassium 3.5 - 5.1 mmol/L -  Chloride 98 - 111 mmol/L -  CO2 22 - 32 mmol/L -  Calcium 8.9 - 10.3 mg/dL -  Total Protein 6.5 - 8.1 g/dL -  Total Bilirubin 0.3 - 1.2 mg/dL -  Alkaline Phos 38 - 126 U/L -  AST 15 - 41 U/L -  ALT 0 - 44 U/L -   Edinburgh Score: Edinburgh Postnatal Depression Scale Screening Tool 03/14/2020  I have been able to laugh and see the funny side of things. 1  I have looked forward with enjoyment to things. 1  I have blamed myself unnecessarily when things went wrong. 0  I have been anxious or worried for no good reason. 2  I have  felt scared or panicky for no good reason. 1  Things have been getting on top of me. 1  I have been so unhappy that I have had difficulty sleeping. 0  I have felt sad or miserable. 1  I have been so unhappy that I have been crying. 1  The thought of harming myself has occurred to me. 0  Edinburgh Postnatal Depression Scale Total 8     After visit meds:  Allergies as of 03/15/2020   No Known Allergies     Medication List    STOP taking these medications   PRENATAL GUMMIES/DHA & FA PO   valACYclovir 500 MG tablet Commonly known as: Valtrex     TAKE these medications   acetaminophen 325 MG tablet Commonly known as: TYLENOL Take 2 tablets (650 mg total) by mouth every 4 (four) hours  as needed for mild pain (temperature > 101.5.).   ibuprofen 800 MG tablet Commonly known as: ADVIL Take 1 tablet (800 mg total) by mouth every 8 (eight) hours.   multivitamin-prenatal 27-0.8 MG Tabs tablet Take 1 tablet by mouth daily at 12 noon.   oxyCODONE 5 MG immediate release tablet Commonly known as: Oxy IR/ROXICODONE Take 1-2 tablets (5-10 mg total) by mouth every 4 (four) hours as needed for moderate pain.        Discharge home in stable condition Infant Feeding: Bottle Infant Disposition:home with mother Discharge instruction: per After Visit Summary and Postpartum booklet. Activity: Advance as tolerated. Pelvic rest for 6 weeks.  Diet: routine diet Future Appointments:No future appointments. Follow up Visit:  [place] [facility] [place] [facility]. Schedule an appointment as soon as possible for a visit.   Specialty: Obstetrics and Gynecology Why: 1 week incision check. Then 4-6 weeks postpartum visit Contact information: [address] [place]  [address] [phone]               Please schedule this patient for a In person postpartum visit in 4 weeks with the following provider: Any provider. Additional Postpartum F/U:Incision check 1 week  Low risk pregnancy complicated by: limited PNC] Delivery mode:  C-Section, Low Transverse  Anticipated Birth Control:  POPs   03/15/2020 Laury Deep, CNM

## 2020-03-19 ENCOUNTER — Ambulatory Visit: Payer: Medicaid Other

## 2020-03-22 ENCOUNTER — Ambulatory Visit: Payer: Medicaid Other

## 2020-03-23 ENCOUNTER — Other Ambulatory Visit (HOSPITAL_COMMUNITY)
Admission: RE | Admit: 2020-03-23 | Discharge: 2020-03-23 | Disposition: A | Discharge status: Home / Self Care | Payer: Medicaid Other | Source: Ambulatory Visit | Attending: Family Medicine | Admitting: Family Medicine

## 2020-03-23 ENCOUNTER — Other Ambulatory Visit: Payer: Self-pay

## 2020-03-23 ENCOUNTER — Ambulatory Visit (INDEPENDENT_AMBULATORY_CARE_PROVIDER_SITE_OTHER): Payer: Medicaid Other | Admitting: *Deleted

## 2020-03-23 ENCOUNTER — Encounter: Payer: Self-pay | Admitting: *Deleted

## 2020-03-23 VITALS — BP 111/68 | HR 98 | Ht 60.0 in | Wt 172.2 lb

## 2020-03-23 DIAGNOSIS — N898 Other specified noninflammatory disorders of vagina: Secondary | ICD-10-CM | POA: Insufficient documentation

## 2020-03-23 DIAGNOSIS — Z4889 Encounter for other specified surgical aftercare: Secondary | ICD-10-CM

## 2020-03-23 NOTE — Progress Notes (Signed)
Patient was assessed and managed by nursing staff during this encounter. I have reviewed the chart and agree with the documentation and plan. I have also made any necessary editorial changes.  Catalina Antigua, MD 03/23/2020 10:24 AM

## 2020-03-23 NOTE — Progress Notes (Signed)
Pt presents for incision check following C/S on 11/12. Steristrips removed and incision was found to be healing well. No redness, swelling or drainage was observed. Proper cleansing instructions of surgical site was given. Pt c/o vaginal itching and irritation. She also stated last intercourse was 2 weeks prior to delivery and desired STI testing. Self swab was obtained. Pt was advised that she will be notified of test results and treatment indicated if any via Mychart. PP appt is scheduled on 12/17. Pt voiced understanding of all information and instructions given.

## 2020-03-26 LAB — CERVICOVAGINAL ANCILLARY ONLY
Bacterial Vaginitis (gardnerella): POSITIVE — AB
Candida Glabrata: NEGATIVE
Candida Vaginitis: NEGATIVE
Chlamydia: NEGATIVE
Comment: NEGATIVE
Comment: NEGATIVE
Comment: NEGATIVE
Comment: NEGATIVE
Comment: NEGATIVE
Comment: NORMAL
Neisseria Gonorrhea: NEGATIVE
Trichomonas: NEGATIVE

## 2020-03-27 ENCOUNTER — Other Ambulatory Visit: Payer: Self-pay | Admitting: Obstetrics & Gynecology

## 2020-03-27 DIAGNOSIS — N760 Acute vaginitis: Secondary | ICD-10-CM

## 2020-03-27 MED ORDER — METRONIDAZOLE 500 MG PO TABS: 500.0000 mg | ORAL_TABLET | Freq: Two times a day (BID) | ORAL | 0 refills | Status: AC

## 2020-04-16 ENCOUNTER — Ambulatory Visit: Payer: Medicaid Other | Admitting: Certified Nurse Midwife

## 2020-04-21 ENCOUNTER — Ambulatory Visit: Payer: Medicaid Other | Admitting: Family Medicine

## 2020-05-17 ENCOUNTER — Ambulatory Visit: Payer: Medicaid Other | Admitting: Family Medicine

## 2020-05-31 ENCOUNTER — Encounter: Payer: Self-pay | Admitting: *Deleted

## 2020-05-31 ENCOUNTER — Telehealth (INDEPENDENT_AMBULATORY_CARE_PROVIDER_SITE_OTHER): Payer: Medicaid Other | Admitting: Certified Nurse Midwife

## 2020-05-31 NOTE — Progress Notes (Signed)
2:27 Jamie Neal not connected virtually for her virtual visit . I called and heard a message "Voicemail is full". I was unable to leave a message. Aunna Snooks,RN  2:40 Jamie Neal not connected virtually. I called her again and again heard a messge " voicemail is full". Will send her a message to reschedule. Jovanni Eckhart,RN

## 2020-05-31 NOTE — Progress Notes (Signed)
Unable to reach patient for mychart appointment today. Appointment will be rescheduled.   Sharyon Cable, CNM 05/31/20, 3:25 PM

## 2020-07-24 ENCOUNTER — Encounter (HOSPITAL_COMMUNITY): Payer: Self-pay | Admitting: Emergency Medicine

## 2020-07-24 ENCOUNTER — Other Ambulatory Visit: Payer: Self-pay

## 2020-07-24 ENCOUNTER — Ambulatory Visit (HOSPITAL_COMMUNITY)
Admission: EM | Admit: 2020-07-24 | Discharge: 2020-07-24 | Disposition: A | Discharge status: Home / Self Care | Payer: Medicaid Other | Attending: Family Medicine | Admitting: Family Medicine

## 2020-07-24 DIAGNOSIS — Z113 Encounter for screening for infections with a predominantly sexual mode of transmission: Secondary | ICD-10-CM

## 2020-07-24 DIAGNOSIS — Z3009 Encounter for other general counseling and advice on contraception: Secondary | ICD-10-CM | POA: Insufficient documentation

## 2020-07-24 LAB — HIV ANTIBODY (ROUTINE TESTING W REFLEX): HIV Screen 4th Generation wRfx: NONREACTIVE

## 2020-07-24 MED ORDER — LEVONORGESTREL 1.5 MG PO TABS: 1.5000 mg | ORAL_TABLET | Freq: Once | ORAL | 0 refills | Status: AC

## 2020-07-24 NOTE — ED Provider Notes (Signed)
[facility]    CSN: [id number] Arrival date & time: 07/24/20  1036      History   Chief Complaint Chief Complaint  Patient presents with  . SEXUALLY TRANSMITTED DISEASE    HPI Jamie Neal is a 25 y.o. female.   Patient presenting today requesting emergency contraception for an episode of unprotected sexual intercourse about 48 hours ago.  She states she just had a baby about 3 months ago and is not interested in becoming pregnant at this time.  Not currently on any sort of contraception.  She is also requesting screening for STDs based on the new sexual encounter with a new partner.  She is asymptomatic at this time without any vaginal discharge, pelvic pain, fevers, nausea vomiting diarrhea, flank pain.     Past Medical History:  Diagnosis Date  . Medical history non-contributory     Patient Active Problem List   Diagnosis Date Noted  . Encounter for induction of labor 03/12/2020  . Pregnancy 03/12/2020  . Herpes infection 02/25/2020  . Late prenatal care affecting pregnancy, antepartum, third trimester 02/17/2020  . Insufficient prenatal care in third trimester 02/17/2020  . Supervision of low-risk pregnancy 08/18/2019  . Obesity (BMI 30.0-34.9) 08/18/2019    Past Surgical History:  Procedure Laterality Date  . CESAREAN SECTION  03/12/2020   Procedure: CESAREAN SECTION;  Surgeon: Warden Fillers, MD;  Location: [facility];  Service: Obstetrics;;  . NO PAST SURGERIES      OB History    Gravida  2   Para  2   Term  2   Preterm      AB      Living  2     SAB      IAB      Ectopic      Multiple  0   Live Births  2            Home Medications    Prior to Admission medications   Medication Sig Start Date End Date Taking? Authorizing Provider  levonorgestrel (PLAN B 1-STEP) 1.5 MG tablet Take 1 tablet (1.5 mg total) by mouth once for 1 dose. 07/24/20 07/24/20 Yes Particia Nearing, PA-C  acetaminophen (TYLENOL) 325 MG  tablet Take 2 tablets (650 mg total) by mouth every 4 (four) hours as needed for mild pain (temperature > 101.5.). 03/15/20   Raelyn Mora, CNM  ibuprofen (ADVIL) 800 MG tablet Take 1 tablet (800 mg total) by mouth every 8 (eight) hours. 03/15/20   Raelyn Mora, CNM  oxyCODONE (OXY IR/ROXICODONE) 5 MG immediate release tablet Take 1-2 tablets (5-10 mg total) by mouth every 4 (four) hours as needed for moderate pain. 03/15/20   Raelyn Mora, CNM  Prenatal Vit-Fe Fumarate-FA (MULTIVITAMIN-PRENATAL) 27-0.8 MG TABS tablet Take 1 tablet by mouth daily at 12 noon. Patient not taking: No sig reported    [provider]    Family History Family History  Problem Relation Age of Onset  . Hypertension Father     Social History Social History   Tobacco Use  . Smoking status: Never Smoker  . Smokeless tobacco: Never Used  Vaping Use  . Vaping Use: Never used  Substance Use Topics  . Alcohol use: Not Currently    Comment: occasionally  . Drug use: Never     Allergies   Patient has no known allergies.   Review of Systems Review of Systems Per HPI Physical Exam Triage Vital Signs ED Triage Vitals  Enc Vitals Group     BP 07/24/20 1049 104/64     Pulse Rate 07/24/20 1051 73     Resp 07/24/20 1049 18     Temp 07/24/20 1049 98.3 F (36.8 C)     Temp Source 07/24/20 1049 Oral     SpO2 07/24/20 1049 98 %     Weight --      Height --      Head Circumference --      Peak Flow --      Pain Score 07/24/20 1049 0     Pain Loc --      Pain Edu? --      Excl. in GC? --    No data found.  Updated Vital Signs BP 104/64 (BP Location: Left Arm)   Pulse 73   Temp 98.3 F (36.8 C) (Oral)   Resp 18   LMP  (LMP Unknown)   SpO2 98%   Visual Acuity Right Eye Distance:   Left Eye Distance:   Bilateral Distance:    Right Eye Near:   Left Eye Near:    Bilateral Near:     Physical Exam Vitals and nursing note reviewed.  Constitutional:      Appearance: Normal  appearance. She is not ill-appearing.  HENT:     Head: Atraumatic.     Right Ear: Tympanic membrane normal.     Left Ear: Tympanic membrane normal.     Nose: Nose normal.     Mouth/Throat:     Mouth: Mucous membranes are moist.     Pharynx: Oropharynx is clear.  Eyes:     Extraocular Movements: Extraocular movements intact.     Conjunctiva/sclera: Conjunctivae normal.  Cardiovascular:     Rate and Rhythm: Normal rate and regular rhythm.     Heart sounds: Normal heart sounds.  Pulmonary:     Effort: Pulmonary effort is normal.     Breath sounds: Normal breath sounds.  Abdominal:     General: Bowel sounds are normal. There is no distension.     Palpations: Abdomen is soft.     Tenderness: There is no abdominal tenderness. There is no guarding.  Genitourinary:    Comments: GU exam deferred, self swab performed Musculoskeletal:        General: Normal range of motion.     Cervical back: Normal range of motion and neck supple.  Skin:    General: Skin is warm and dry.  Neurological:     Mental Status: She is alert and oriented to person, place, and time.  Psychiatric:        Mood and Affect: Mood normal.        Thought Content: Thought content normal.        Judgment: Judgment normal.      UC Treatments / Results  Labs (all labs ordered are listed, but only abnormal results are displayed) Labs Reviewed  HIV ANTIBODY (ROUTINE TESTING W REFLEX)  RPR  CERVICOVAGINAL ANCILLARY ONLY    EKG   Radiology No results found.  Procedures Procedures (including critical care time)  Medications Ordered in UC Medications - No data to display  Initial Impression / Assessment and Plan / UC Course  I have reviewed the triage vital signs and the nursing notes.  Pertinent labs & imaging results that were available during my care of the patient were reviewed by me and considered in my medical decision making (see chart for details).     Return for plan  be printed out today as  she is a thin window for this.  Did discuss free contraceptive counseling and prescriptions at the health department that would be available to her if desired given her lack of desire for fertility at this point.  Screening STD labs pending, will treat based on results of these.  Discussed abstinence until these results return.  Safe sexual practices reviewed.  Final Clinical Impressions(s) / UC Diagnoses   Final diagnoses:  Emergency contraceptive counseling  Routine screening for STI (sexually transmitted infection)   Discharge Instructions   None    ED Prescriptions    Medication Sig Dispense Auth. Provider   levonorgestrel (PLAN B 1-STEP) 1.5 MG tablet Take 1 tablet (1.5 mg total) by mouth once for 1 dose. 1 tablet Particia Nearing, [place]     PDMP not reviewed this encounter.   Particia Nearing, [place] 07/24/20 1148

## 2020-07-24 NOTE — ED Triage Notes (Signed)
Pt presents today requesting "Plan B" and also to be tested for all STDs including HIV.

## 2020-07-25 LAB — RPR: RPR Ser Ql: NONREACTIVE

## 2020-07-26 LAB — CERVICOVAGINAL ANCILLARY ONLY
Bacterial Vaginitis (gardnerella): POSITIVE — AB
Candida Glabrata: NEGATIVE
Candida Vaginitis: NEGATIVE
Chlamydia: NEGATIVE
Comment: NEGATIVE
Comment: NEGATIVE
Comment: NEGATIVE
Comment: NEGATIVE
Comment: NEGATIVE
Comment: NORMAL
Neisseria Gonorrhea: NEGATIVE
Trichomonas: NEGATIVE

## 2020-10-21 ENCOUNTER — Encounter (HOSPITAL_COMMUNITY): Payer: Self-pay

## 2020-10-21 ENCOUNTER — Ambulatory Visit (HOSPITAL_COMMUNITY)
Admission: EM | Admit: 2020-10-21 | Discharge: 2020-10-21 | Disposition: A | Discharge status: Home / Self Care | Payer: Medicaid Other | Attending: Physician Assistant | Admitting: Physician Assistant

## 2020-10-21 ENCOUNTER — Other Ambulatory Visit: Payer: Self-pay

## 2020-10-21 DIAGNOSIS — N760 Acute vaginitis: Secondary | ICD-10-CM

## 2020-10-21 DIAGNOSIS — R350 Frequency of micturition: Secondary | ICD-10-CM | POA: Diagnosis not present

## 2020-10-21 DIAGNOSIS — R8290 Unspecified abnormal findings in urine: Secondary | ICD-10-CM

## 2020-10-21 DIAGNOSIS — R3915 Urgency of urination: Secondary | ICD-10-CM

## 2020-10-21 DIAGNOSIS — N898 Other specified noninflammatory disorders of vagina: Secondary | ICD-10-CM | POA: Diagnosis present

## 2020-10-21 LAB — POCT URINALYSIS DIPSTICK, ED / UC
Bilirubin Urine: NEGATIVE
Glucose, UA: NEGATIVE mg/dL
Ketones, ur: NEGATIVE mg/dL
Nitrite: NEGATIVE
Protein, ur: NEGATIVE mg/dL
Specific Gravity, Urine: 1.025 (ref 1.005–1.030)
Urobilinogen, UA: 0.2 mg/dL (ref 0.0–1.0)
pH: 5.5 (ref 5.0–8.0)

## 2020-10-21 LAB — CERVICOVAGINAL ANCILLARY ONLY
Bacterial Vaginitis (gardnerella): NEGATIVE
Candida Glabrata: NEGATIVE
Candida Vaginitis: POSITIVE — AB
Chlamydia: NEGATIVE
Comment: NEGATIVE
Comment: NEGATIVE
Comment: NEGATIVE
Comment: NEGATIVE
Comment: NEGATIVE
Comment: NORMAL
Neisseria Gonorrhea: NEGATIVE
Trichomonas: NEGATIVE

## 2020-10-21 LAB — POC URINE PREG, ED: Preg Test, Ur: NEGATIVE

## 2020-10-21 MED ORDER — NITROFURANTOIN MONOHYD MACRO 100 MG PO CAPS: 100.0000 mg | ORAL_CAPSULE | Freq: Two times a day (BID) | ORAL | 0 refills | Status: DC

## 2020-10-21 MED ORDER — FLUCONAZOLE 150 MG PO TABS: 150.0000 mg | ORAL_TABLET | ORAL | 0 refills | Status: AC

## 2020-10-21 NOTE — ED Provider Notes (Signed)
[facility]    CSN: [id number] Arrival date & time: 10/21/20  2409      History   Chief Complaint Chief Complaint  Patient presents with   Vaginal Itching    Started 2 days ago,    Vaginal Discharge    White, no odor     HPI Jamie Neal is a 25 y.o. female.   Patient presents today with a 3-day history of vaginal pruritus.  She reports associated vaginal discharge which is described as white and without odor.  She denies any recent antibiotic use.  Denies any changes to personal hygiene products including soaps or condoms.  She does report changing her laundry detergent and wonders if this could be contributing to symptoms.  She does have a history of recurrent bacterial vaginosis and yeast infection and states current symptoms are similar to previous episodes of yeast infection.  She does report some urinary symptoms including urgency and frequency.  She denies any dysuria, abdominal pain, pelvic pain, fever, nausea, vomiting.  Denies any recent antibiotic use.  She denies history of diabetes.  Does not use SGLT2 inhibitor.  She does report a new sexual partner and is interested in STI panel.   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   Late prenatal care affecting pregnancy, antepartum, third trimester 02/17/2020   Insufficient prenatal care in third trimester 02/17/2020   Supervision of low-risk pregnancy 08/18/2019   Obesity (BMI 30.0-34.9) 08/18/2019    Past Surgical History:  Procedure Laterality Date   CESAREAN SECTION  03/12/2020   Procedure: CESAREAN SECTION;  Surgeon: Warden Fillers, MD;  Location: [facility];  Service: Obstetrics;;   NO PAST SURGERIES      OB History     Gravida  2   Para  2   Term  2   Preterm      AB      Living  2      SAB       IAB      Ectopic      Multiple  0   Live Births  2            Home Medications    Prior to Admission medications   Medication Sig Start Date End Date Taking? Authorizing Provider  fluconazole (DIFLUCAN) 150 MG tablet Take 1 tablet (150 mg total) by mouth every 3 (three) days for 3 doses. 10/21/20 10/28/20 Yes Jamie Neal, Noberto Retort, PA-C  nitrofurantoin, macrocrystal-monohydrate, (MACROBID) 100 MG capsule Take 1 capsule (100 mg total) by mouth 2 (two) times daily. 10/21/20  Yes Teigen Parslow, Noberto Retort, PA-C  acetaminophen (TYLENOL) 325 MG tablet TAKE 2 TABLETS (650 MG TOTAL) BY MOUTH EVERY FOUR HOURS AS NEEDED FOR MILD PAIN (TEMPERATURE > 101.5.). 03/15/20 03/15/21  Raelyn Mora, CNM  ibuprofen (ADVIL) 800 MG tablet TAKE 1 TABLET (800 MG TOTAL) BY MOUTH EVERY EIGHT HOURS. 03/15/20 03/15/21  Raelyn Mora, CNM  Prenatal Vit-Fe Fumarate-FA (MULTIVITAMIN-PRENATAL) 27-0.8 MG TABS tablet Take 1 tablet by mouth daily at 12 noon. Patient not taking: No sig reported    [provider]    Family History Family History  Problem Relation Age of Onset   Healthy Mother    Diabetes Father    Hypertension Father     Social History Social  History   Tobacco Use   Smoking status: Never   Smokeless tobacco: Never  Vaping Use   Vaping Use: Some days  Substance Use Topics   Alcohol use: Yes    Comment: occasionally   Drug use: Never     Allergies   Patient has no known allergies.   Review of Systems Review of Systems  Constitutional:  Negative for activity change, appetite change, fatigue and fever.  Respiratory:  Negative for cough and shortness of breath.   Cardiovascular:  Negative for chest pain.  Gastrointestinal:  Negative for abdominal pain, diarrhea, nausea and vomiting.  Genitourinary:  Positive for frequency, urgency, vaginal discharge and vaginal pain. Negative for dysuria and vaginal bleeding.  Musculoskeletal:  Negative for arthralgias and myalgias.  Neurological:   Negative for dizziness, light-headedness and headaches.    Physical Exam Triage Vital Signs ED Triage Vitals  Enc Vitals Group     BP 10/21/20 0936 103/60     Pulse Rate 10/21/20 0936 76     Resp 10/21/20 0936 17     Temp 10/21/20 0936 97.9 F (36.6 C)     Temp Source 10/21/20 0936 Oral     SpO2 10/21/20 0936 100 %     Weight --      Height --      Head Circumference --      Peak Flow --      Pain Score 10/21/20 0930 6     Pain Loc --      Pain Edu? --      Excl. in GC? --    No data found.  Updated Vital Signs BP 103/60 (BP Location: Left Arm)   Pulse 76   Temp 97.9 F (36.6 C) (Oral)   Resp 17   LMP 10/05/2020   SpO2 100%   Breastfeeding No   Visual Acuity Right Eye Distance:   Left Eye Distance:   Bilateral Distance:    Right Eye Near:   Left Eye Near:    Bilateral Near:     Physical Exam Vitals reviewed.  Constitutional:      General: She is awake. She is not in acute distress.    Appearance: Normal appearance. She is normal weight. She is not ill-appearing.     Comments: Very pleasant female appears stated age in no acute distress  HENT:     Head: Normocephalic and atraumatic.  Cardiovascular:     Rate and Rhythm: Normal rate and regular rhythm.     Heart sounds: Normal heart sounds, S1 normal and S2 normal. No murmur heard. Pulmonary:     Effort: Pulmonary effort is normal.     Breath sounds: Normal breath sounds. No wheezing, rhonchi or rales.     Comments: Clear to auscultation bilaterally Abdominal:     General: Bowel sounds are normal.     Palpations: Abdomen is soft.     Tenderness: There is no abdominal tenderness. There is no right CVA tenderness, left CVA tenderness, guarding or rebound.     Comments: Benign abdominal exam; no tenderness palpation.  No CVA tenderness.  Genitourinary:    Comments: Exam deferred Psychiatric:        Behavior: Behavior is cooperative.     UC Treatments / Results  Labs (all labs ordered are listed, but  only abnormal results are displayed) Labs Reviewed  POCT URINALYSIS DIPSTICK, ED / UC - Abnormal; Notable for the following components:      Result Value   Hgb urine  dipstick SMALL (*)    Leukocytes,Ua MODERATE (*)    All other components within normal limits  URINE CULTURE  POC URINE PREG, ED  CERVICOVAGINAL ANCILLARY ONLY    EKG   Radiology No results found.  Procedures Procedures (including critical care time)  Medications Ordered in UC Medications - No data to display  Initial Impression / Assessment and Plan / UC Course  I have reviewed the triage vital signs and the nursing notes.  Pertinent labs & imaging results that were available during my care of the patient were reviewed by me and considered in my medical decision making (see chart for details).     UA showed evidence of infection so we will treat with Macrobid.  Urine culture was obtained and we discussed potential need to change antibiotics based on susceptibilities identified on culture.  Urine pregnancy test was negative today.  Will empirically treat for yeast with Diflucan every 72 hours as needed for up to 3 doses. STI swab collected today we will contact patient to arrange additional treatment if necessary based on lab results.  Patient can use over-the-counter medications for additional symptom relief.  Recommended she use hypoallergenic soaps and detergents and you wear loosefitting cotton underwear.  Discussed alarm symptoms or warrant emergent evaluation.  Strict return precautions given to which patient expressed understanding.   Final Clinical Impressions(s) / UC Diagnoses   Final diagnoses:  Acute vaginitis  Vaginal itching  Urinary frequency  Urinary urgency  Abnormal urinalysis     Discharge Instructions      Your urine had some evidence of infection so we are going to start an antibiotic known as nitrofurantoin.  This can make your urine a darker color.  Please make sure you are drinking  plenty of fluid.  We are also going to treat you for a yeast infection.  Please take Diflucan once every 3 days for total of 3 tablets.  If we need to do any additional treatment based on your STI swab result we will contact you.  You can use over-the-counter yeast products to help with additional symptoms.  If you have any severe symptoms or symptoms do not improve please return for reevaluation.     ED Prescriptions     Medication Sig Dispense Auth. Provider   nitrofurantoin, macrocrystal-monohydrate, (MACROBID) 100 MG capsule Take 1 capsule (100 mg total) by mouth 2 (two) times daily. 10 capsule Jamie Neal K, PA-C   fluconazole (DIFLUCAN) 150 MG tablet Take 1 tablet (150 mg total) by mouth every 3 (three) days for 3 doses. 3 tablet Fernand Sorbello, Noberto Retort, PA-C      PDMP not reviewed this encounter.   Jamie Hawking, PA-C 10/21/20 4540

## 2020-10-21 NOTE — Discharge Instructions (Addendum)
Your urine had some evidence of infection so we are going to start an antibiotic known as nitrofurantoin.  This can make your urine a darker color.  Please make sure you are drinking plenty of fluid.  We are also going to treat you for a yeast infection.  Please take Diflucan once every 3 days for total of 3 tablets.  If we need to do any additional treatment based on your STI swab result we will contact you.  You can use over-the-counter yeast products to help with additional symptoms.  If you have any severe symptoms or symptoms do not improve please return for reevaluation.

## 2020-10-21 NOTE — ED Triage Notes (Signed)
Patient c/o vaginal itching that started 2 days ago with white discharge (no odor) and redness. Patient has not taken any interventions. Pt states no back pain, feeling of having to urinate but does not urinate much.

## 2020-10-22 LAB — URINE CULTURE: Culture: <10000 — AB

## 2020-11-08 ENCOUNTER — Ambulatory Visit (HOSPITAL_COMMUNITY)
Admission: EM | Admit: 2020-11-08 | Discharge: 2020-11-08 | Disposition: A | Discharge status: Home / Self Care | Payer: Medicaid Other | Attending: Student | Admitting: Student

## 2020-11-08 ENCOUNTER — Other Ambulatory Visit: Payer: Self-pay

## 2020-11-08 ENCOUNTER — Encounter (HOSPITAL_COMMUNITY): Payer: Self-pay

## 2020-11-08 DIAGNOSIS — N760 Acute vaginitis: Secondary | ICD-10-CM | POA: Diagnosis not present

## 2020-11-08 DIAGNOSIS — Z3202 Encounter for pregnancy test, result negative: Secondary | ICD-10-CM | POA: Diagnosis not present

## 2020-11-08 DIAGNOSIS — Z113 Encounter for screening for infections with a predominantly sexual mode of transmission: Secondary | ICD-10-CM | POA: Diagnosis not present

## 2020-11-08 LAB — HIV ANTIBODY (ROUTINE TESTING W REFLEX): HIV Screen 4th Generation wRfx: NONREACTIVE

## 2020-11-08 LAB — POC URINE PREG, ED: Preg Test, Ur: NEGATIVE

## 2020-11-08 MED ORDER — METRONIDAZOLE 500 MG PO TABS: 500.0000 mg | ORAL_TABLET | Freq: Two times a day (BID) | ORAL | 0 refills | Status: DC

## 2020-11-08 NOTE — Discharge Instructions (Addendum)
-  Your pregnancy test is negative. -For bacterial vaginosis, start the antibiotic-Flagyl (metronidazole), 2 pills daily for 7 days.  You can take this with food if you have a sensitive stomach.  Avoid alcohol while taking this medication and for 2 days after as this will cause severe nausea and vomiting. -We're also testing for yeast, gonorrhea, chlamydia, trichomonas, HIV, syphilis.  We will call you if this is positive.  Abstain from intercourse until negative results. -Seek additional medical attention if symptoms worsen/persist despite treatment

## 2020-11-08 NOTE — ED Triage Notes (Signed)
Pt in with c/o late menstrual cycle & nausea, requesting pregnancy test  Pt also c/o vaginal odor, would like std testing

## 2020-11-08 NOTE — ED Provider Notes (Signed)
[facility]    CSN: [id number] Arrival date & time: 11/08/20  0849      History   Chief Complaint Chief Complaint  Patient presents with   Possible Pregnancy   vaginal odor    HPI Emonii Wienke is a 25 y.o. female presenting with late period (1 week) and nausea. Medical history genital herpes, pregnancy, insufficient prenatal care. Notes late period, nausea without vomiting, vaginal odor. Thick white vaginal discharge with odor. States she was treated with diflucan at last visit and this helped with her symptoms but the odor has persisted and now the discharge is getting worse again. Requesting STI screen though denies new partners. Denies hematuria, dysuria, frequency, urgency, back pain, n/v/d/abd pain, fevers/chills, abdnormal vaginal lesions/rashes.    HPI  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   Late prenatal care affecting pregnancy, antepartum, third trimester 02/17/2020   Insufficient prenatal care in third trimester 02/17/2020   Supervision of low-risk pregnancy 08/18/2019   Obesity (BMI 30.0-34.9) 08/18/2019    Past Surgical History:  Procedure Laterality Date   CESAREAN SECTION  03/12/2020   Procedure: CESAREAN SECTION;  Surgeon: Warden Fillers, MD;  Location: [facility];  Service: Obstetrics;;   NO PAST SURGERIES      OB History     Gravida  2   Para  2   Term  2   Preterm      AB      Living  2      SAB      IAB      Ectopic      Multiple  0   Live Births  2            Home Medications    Prior to Admission medications   Medication Sig Start Date End Date Taking? Authorizing Provider  metroNIDAZOLE (FLAGYL) 500 MG tablet Take 1 tablet (500 mg total) by mouth 2 (two) times daily. 11/08/20  Yes Rhys Martini, PA-C   acetaminophen (TYLENOL) 325 MG tablet TAKE 2 TABLETS (650 MG TOTAL) BY MOUTH EVERY FOUR HOURS AS NEEDED FOR MILD PAIN (TEMPERATURE > 101.5.). 03/15/20 03/15/21  Raelyn Mora, CNM  ibuprofen (ADVIL) 800 MG tablet TAKE 1 TABLET (800 MG TOTAL) BY MOUTH EVERY EIGHT HOURS. 03/15/20 03/15/21  Raelyn Mora, CNM  nitrofurantoin, macrocrystal-monohydrate, (MACROBID) 100 MG capsule Take 1 capsule (100 mg total) by mouth 2 (two) times daily. 10/21/20   Raspet, Noberto Retort, PA-C  Prenatal Vit-Fe Fumarate-FA (MULTIVITAMIN-PRENATAL) 27-0.8 MG TABS tablet Take 1 tablet by mouth daily at 12 noon. Patient not taking: No sig reported    [provider]    Family History Family History  Problem Relation Age of Onset   Healthy Mother    Diabetes Father    Hypertension Father     Social History Social History   Tobacco Use   Smoking status: Never   Smokeless tobacco: Never  Vaping Use   Vaping Use: Some days  Substance Use Topics   Alcohol use: Yes    Comment: occasionally   Drug use: Never     Allergies   Patient has no known allergies.   Review of Systems Review of Systems  Constitutional:  Negative for chills and fever.  HENT:  Negative for sore throat.  Eyes:  Negative for pain and redness.  Respiratory:  Negative for shortness of breath.   Cardiovascular:  Negative for chest pain.  Gastrointestinal:  Negative for abdominal pain, diarrhea, nausea and vomiting.  Genitourinary:  Positive for menstrual problem and vaginal discharge. Negative for decreased urine volume, difficulty urinating, dysuria, flank pain, frequency, genital sores, hematuria and urgency.  Musculoskeletal:  Negative for back pain.  Skin:  Negative for rash.  All other systems reviewed and are negative.   Physical Exam Triage Vital Signs ED Triage Vitals  Enc Vitals Group     BP 11/08/20 0929 99/71     Pulse Rate 11/08/20 0929 83     Resp 11/08/20 0929 18     Temp 11/08/20 0929 98.9 F (37.2 C)      Temp Source 11/08/20 0929 Oral     SpO2 11/08/20 0929 100 %     Weight --      Height --      Head Circumference --      Peak Flow --      Pain Score 11/08/20 0927 0     Pain Loc --      Pain Edu? --      Excl. in GC? --    No data found.  Updated Vital Signs BP 99/71 (BP Location: Right Arm)   Pulse 83   Temp 98.9 F (37.2 C) (Oral)   Resp 18   LMP 10/04/2020 (Exact Date)   SpO2 100%   Visual Acuity Right Eye Distance:   Left Eye Distance:   Bilateral Distance:    Right Eye Near:   Left Eye Near:    Bilateral Near:     Physical Exam Vitals reviewed.  Constitutional:      General: She is not in acute distress.    Appearance: Normal appearance. She is not ill-appearing.  HENT:     Head: Normocephalic and atraumatic.     Mouth/Throat:     Mouth: Mucous membranes are moist.     Comments: Moist mucous membranes Eyes:     Extraocular Movements: Extraocular movements intact.     Pupils: Pupils are equal, round, and reactive to light.  Cardiovascular:     Rate and Rhythm: Normal rate and regular rhythm.     Heart sounds: Normal heart sounds.  Pulmonary:     Effort: Pulmonary effort is normal.     Breath sounds: Normal breath sounds. No wheezing, rhonchi or rales.  Abdominal:     General: Bowel sounds are normal. There is no distension.     Palpations: Abdomen is soft. There is no mass.     Tenderness: There is no abdominal tenderness. There is no right CVA tenderness, left CVA tenderness, guarding or rebound.  Genitourinary:    Comments: deferred Skin:    General: Skin is warm.     Capillary Refill: Capillary refill takes less than 2 seconds.     Comments: Good skin turgor  Neurological:     General: No focal deficit present.     Mental Status: She is alert and oriented to person, place, and time.  Psychiatric:        Mood and Affect: Mood normal.        Behavior: Behavior normal.     UC Treatments / Results  Labs (all labs ordered are listed, but only  abnormal results are displayed) Labs Reviewed  RPR  HIV ANTIBODY (ROUTINE TESTING W REFLEX)  POC URINE PREG, ED  CERVICOVAGINAL ANCILLARY ONLY  EKG   Radiology No results found.  Procedures Procedures (including critical care time)  Medications Ordered in UC Medications - No data to display  Initial Impression / Assessment and Plan / UC Course  I have reviewed the triage vital signs and the nursing notes.  Pertinent labs & imaging results that were available during my care of the patient were reviewed by me and considered in my medical decision making (see chart for details).     This patient is a very pleasant 25 y.o. year old female presenting with suspected BV. Afebrile, nontachy, no abd pain or CVAT.   This patient was last seen at our urgent care for vaginitis about 2 weeks ago, she tested positive for Candida at that time and was treated with diflucan. Urine pregnancy negative today. Self swab sent for BV, yeast, gonorrhea, chlamydia, trichomonas.  Also sent for HIV and syphilis.  We will treat for suspected BV with Flagyl as below, will otherwise wait to treat for results of swab. Safe sex precautions.  Coding this visit a level 4 for review of past notes and labs (09/2020), order and interpretation of labs today, and prescription drug management.  Final Clinical Impressions(s) / UC Diagnoses   Final diagnoses:  Vaginitis and vulvovaginitis  Routine screening for STI (sexually transmitted infection)  Negative pregnancy test     Discharge Instructions      -Your pregnancy test is negative. -For bacterial vaginosis, start the antibiotic-Flagyl (metronidazole), 2 pills daily for 7 days.  You can take this with food if you have a sensitive stomach.  Avoid alcohol while taking this medication and for 2 days after as this will cause severe nausea and vomiting. -We're also testing for yeast, gonorrhea, chlamydia, trichomonas, HIV, syphilis.  We will call you if  this is positive.  Abstain from intercourse until negative results. -Seek additional medical attention if symptoms worsen/persist despite treatment     ED Prescriptions     Medication Sig Dispense Auth. Provider   metroNIDAZOLE (FLAGYL) 500 MG tablet Take 1 tablet (500 mg total) by mouth 2 (two) times daily. 14 tablet Rhys Martini, PA-C      PDMP not reviewed this encounter.   Rhys Martini, PA-C 11/08/20 1101

## 2020-11-09 LAB — CERVICOVAGINAL ANCILLARY ONLY
Bacterial Vaginitis (gardnerella): NEGATIVE
Candida Glabrata: NEGATIVE
Candida Vaginitis: POSITIVE — AB
Chlamydia: NEGATIVE
Comment: NEGATIVE
Comment: NEGATIVE
Comment: NEGATIVE
Comment: NEGATIVE
Comment: NEGATIVE
Comment: NORMAL
Neisseria Gonorrhea: NEGATIVE
Trichomonas: NEGATIVE

## 2020-11-09 LAB — RPR: RPR Ser Ql: NONREACTIVE

## 2020-11-11 ENCOUNTER — Other Ambulatory Visit (HOSPITAL_COMMUNITY): Payer: Self-pay

## 2020-11-11 ENCOUNTER — Telehealth (HOSPITAL_COMMUNITY): Payer: Self-pay | Admitting: Emergency Medicine

## 2020-11-11 MED ORDER — FLUCONAZOLE 150 MG PO TABS
150.0000 mg | ORAL_TABLET | Freq: Once | ORAL | 0 refills | Status: DC
  Filled 2020-11-11: qty 2, 2d supply, fill #0

## 2020-11-11 MED ORDER — FLUCONAZOLE 150 MG PO TABS: 150.0000 mg | ORAL_TABLET | Freq: Once | ORAL | 0 refills | Status: AC

## 2020-11-11 NOTE — Telephone Encounter (Signed)
Resent to correct pharmacy.

## 2020-11-21 ENCOUNTER — Ambulatory Visit (HOSPITAL_COMMUNITY)
Admission: EM | Admit: 2020-11-21 | Discharge: 2020-11-21 | Disposition: A | Discharge status: Home / Self Care | Payer: Medicaid Other | Attending: Emergency Medicine | Admitting: Emergency Medicine

## 2020-11-21 ENCOUNTER — Other Ambulatory Visit: Payer: Self-pay

## 2020-11-21 ENCOUNTER — Encounter (HOSPITAL_COMMUNITY): Payer: Self-pay

## 2020-11-21 DIAGNOSIS — Z113 Encounter for screening for infections with a predominantly sexual mode of transmission: Secondary | ICD-10-CM | POA: Insufficient documentation

## 2020-11-21 LAB — RPR: RPR Ser Ql: NONREACTIVE

## 2020-11-21 LAB — HIV ANTIBODY (ROUTINE TESTING W REFLEX): HIV Screen 4th Generation wRfx: NONREACTIVE

## 2020-11-21 NOTE — ED Provider Notes (Signed)
[facility]   [id number] 11/21/20 Arrival Time: 1002   CC: CONCERN FOR STD  SUBJECTIVE:  Jamie Neal is a 25 y.o. female who presents requesting STI screening.  Currently asymptomatic.  Partner asymptomatic but does report new sexual partner.  Last unprotected sexual encounter last night.  Sexually active with multiple  partner.  Denies similar symptoms in the past.    Denies fever, chills, nausea, vomiting, abdominal or pelvic pain, urinary symptoms, vaginal itching, vaginal odor, vaginal bleeding, dyspareunia, vaginal rashes or lesions.    Patient's last menstrual period was 11/11/2020.  ROS: As per HPI.  All other pertinent ROS negative.     Past Medical History:  Diagnosis Date   Medical history non-contributory    Past Surgical History:  Procedure Laterality Date   CESAREAN SECTION  03/12/2020   Procedure: CESAREAN SECTION;  Surgeon: Warden Fillers, MD;  Location: [facility];  Service: Obstetrics;;   NO PAST SURGERIES     No Known Allergies No current facility-administered medications on file prior to encounter.   Current Outpatient Medications on File Prior to Encounter  Medication Sig Dispense Refill   acetaminophen (TYLENOL) 325 MG tablet TAKE 2 TABLETS (650 MG TOTAL) BY MOUTH EVERY FOUR HOURS AS NEEDED FOR MILD PAIN (TEMPERATURE > 101.5.). 30 tablet 0   ibuprofen (ADVIL) 800 MG tablet TAKE 1 TABLET (800 MG TOTAL) BY MOUTH EVERY EIGHT HOURS. 30 tablet 0   metroNIDAZOLE (FLAGYL) 500 MG tablet Take 1 tablet (500 mg total) by mouth 2 (two) times daily. 14 tablet 0   nitrofurantoin, macrocrystal-monohydrate, (MACROBID) 100 MG capsule Take 1 capsule (100 mg total) by mouth 2 (two) times daily. 10 capsule 0   Prenatal Vit-Fe Fumarate-FA (MULTIVITAMIN-PRENATAL) 27-0.8 MG TABS tablet Take 1 tablet by mouth daily at 12 noon. (Patient not taking: No sig reported)     Social History   Socioeconomic History   Marital status: Single    Spouse name: Not on file    Number of children: Not on file   Years of education: Not on file   Highest education level: Not on file  Occupational History   Not on file  Tobacco Use   Smoking status: Never   Smokeless tobacco: Never  Vaping Use   Vaping Use: Some days  Substance and Sexual Activity   Alcohol use: Yes    Comment: occasionally   Drug use: Never   Sexual activity: Yes    Birth control/protection: None  Other Topics Concern   Not on file  Social History Narrative   Not on file   Social Determinants of Health   Financial Resource Strain: Not on file  Food Insecurity: No Food Insecurity   Worried About Running Out of Food in the Last Year: Never true   Ran Out of Food in the Last Year: Never true  Transportation Needs: No Transportation Needs   Lack of Transportation (Medical): No   Lack of Transportation (Non-Medical): No  Physical Activity: Not on file  Stress: Not on file  Social Connections: Not on file  Intimate Partner Violence: Not on file   Family History  Problem Relation Age of Onset   Healthy Mother    Diabetes Father    Hypertension Father     OBJECTIVE:  Vitals:   11/21/20 1014  BP: (!) 106/57  Pulse: 77  Resp: 18  Temp: 98.4 F (36.9 C)  TempSrc: Oral  SpO2: 100%     General appearance: alert, NAD, appears stated age  Head: NCAT Throat: lips, mucosa, and tongue normal; teeth and gums normal Lungs: CTA bilaterally without adventitious breath sounds Heart: regular rate and rhythm.  Radial pulses 2+ symmetrical bilaterally Back: no CVA tenderness Abdomen: soft, non-tender; bowel sounds normal; no masses or organomegaly; no guarding or rebound tenderness GU: deferred Skin: warm and dry Psychological:  Alert and cooperative. Normal mood and affect.  LABS:  Results for orders placed or performed during the hospital encounter of 11/08/20  RPR  Result Value Ref Range   RPR Ser Ql NON REACTIVE NON REACTIVE  HIV Antibody (routine testing w rflx)  Result  Value Ref Range   HIV Screen 4th Generation wRfx Non Reactive Non Reactive  POC urine pregnancy  Result Value Ref Range   Preg Test, Ur NEGATIVE NEGATIVE  Cervicovaginal ancillary only  Result Value Ref Range   Neisseria Gonorrhea Negative    Chlamydia Negative    Trichomonas Negative    Bacterial Vaginitis (gardnerella) Negative    Candida Vaginitis Positive (A)    Candida Glabrata Negative    Comment      Normal Reference Range Bacterial Vaginosis - Negative   Comment Normal Reference Range Candida Species - Negative    Comment Normal Reference Range Candida Galbrata - Negative    Comment Normal Reference Range Trichomonas - Negative    Comment Normal Reference Ranger Chlamydia - Negative    Comment      Normal Reference Range Neisseria Gonorrhea - Negative    Labs Reviewed  RPR  HIV ANTIBODY (ROUTINE TESTING W REFLEX)  CERVICOVAGINAL ANCILLARY ONLY    ASSESSMENT & PLAN:  1. Screening examination for STD (sexually transmitted disease)     No orders of the defined types were placed in this encounter.   Pending: Labs Reviewed  RPR  HIV ANTIBODY (ROUTINE TESTING W REFLEX)  CERVICOVAGINAL ANCILLARY ONLY    Self swab obtained and we will treat based on results.   HIV and RPR pending We will follow up with you regarding the results of your test If tests are positive, please abstain from sexual activity until you and your partner(s) are treated Follow up with PCP or [facility] if symptoms persists Return here or go to ER if you have any new or worsening symptoms    Reviewed expectations re: course of current medical issues. Questions answered. Outlined signs and symptoms indicating need for more acute intervention. Patient verbalized understanding. After Visit Summary given.        Ivette Loyal, NP 11/21/20 1048

## 2020-11-21 NOTE — ED Triage Notes (Signed)
Pt requesting to be tested for STDs, she states she does not have any symptoms.

## 2020-11-21 NOTE — Discharge Instructions (Addendum)
We will contact you if the results from your lab work are positive and require additional treatment.    Do not have sex while taking undergoing treatment for STI.  Make sure that all of your partners get tested and treated.   Use a condom or other barrier method for all sexual encounters.    Return or go to the Emergency Department if symptoms worsen or do not improve in the next few days.  

## 2020-11-22 LAB — CERVICOVAGINAL ANCILLARY ONLY
Bacterial Vaginitis (gardnerella): NEGATIVE
Candida Glabrata: NEGATIVE
Candida Vaginitis: NEGATIVE
Chlamydia: NEGATIVE
Comment: NEGATIVE
Comment: NEGATIVE
Comment: NEGATIVE
Comment: NEGATIVE
Comment: NEGATIVE
Comment: NORMAL
Neisseria Gonorrhea: NEGATIVE
Trichomonas: NEGATIVE

## 2020-12-07 ENCOUNTER — Encounter: Payer: Medicaid Other | Admitting: Obstetrics

## 2020-12-24 ENCOUNTER — Ambulatory Visit (HOSPITAL_COMMUNITY)
Admission: EM | Admit: 2020-12-24 | Discharge: 2020-12-24 | Disposition: A | Discharge status: Home / Self Care | Payer: Medicaid Other | Attending: Family Medicine | Admitting: Family Medicine

## 2020-12-24 ENCOUNTER — Encounter (HOSPITAL_COMMUNITY): Payer: Self-pay

## 2020-12-24 DIAGNOSIS — N939 Abnormal uterine and vaginal bleeding, unspecified: Secondary | ICD-10-CM | POA: Diagnosis not present

## 2020-12-24 DIAGNOSIS — R109 Unspecified abdominal pain: Secondary | ICD-10-CM | POA: Insufficient documentation

## 2020-12-24 DIAGNOSIS — Z113 Encounter for screening for infections with a predominantly sexual mode of transmission: Secondary | ICD-10-CM | POA: Diagnosis not present

## 2020-12-24 DIAGNOSIS — N760 Acute vaginitis: Secondary | ICD-10-CM | POA: Insufficient documentation

## 2020-12-24 LAB — POC URINE PREG, ED: Preg Test, Ur: NEGATIVE

## 2020-12-24 LAB — POCT URINALYSIS DIPSTICK, ED / UC
Bilirubin Urine: NEGATIVE
Glucose, UA: NEGATIVE mg/dL
Ketones, ur: 40 mg/dL — AB
Leukocytes,Ua: NEGATIVE
Nitrite: NEGATIVE
Protein, ur: NEGATIVE mg/dL
Specific Gravity, Urine: >=1.03 (ref 1.005–1.030)
Urobilinogen, UA: 0.2 mg/dL (ref 0.0–1.0)
pH: 5.5 (ref 5.0–8.0)

## 2020-12-24 MED ORDER — CLINDAMYCIN HCL 300 MG PO CAPS: 300.0000 mg | ORAL_CAPSULE | Freq: Two times a day (BID) | ORAL | 0 refills | Status: DC

## 2020-12-24 NOTE — ED Triage Notes (Signed)
Pt states 3 days ago she started having abd pain, fatigue, vaginal bleeding, headache, lower back pain, and vaginal odor. Patient wants to be tested for STDs and pregnancy. Patient states she does not know if she is having a miscarriage.  Last menstrual cycle started: Aug 11

## 2020-12-24 NOTE — ED Provider Notes (Addendum)
[facility]    CSN: [id number] Arrival date & time: 12/24/20  1850      History   Chief Complaint Chief Complaint  Patient presents with   Vaginal Bleeding    HPI Jamie Neal is a 25 y.o. female.   Patient presenting today with concern of 3-day history of vaginal bleeding, lower abdominal cramping off and on, vaginal discharge that is malodorous, urinary frequency.  She states she just had her menstrual cycle 2 weeks ago starting 12/09/2020 and it is very abnormal for her to have bleeding out of her typical cycle.  She is requesting to be checked for pregnancy and STDs today.  She states that she thinks she has bacterial vaginosis as she has had it before with very similar symptoms.  Denies fever, chills, nausea, vomiting, bowel changes, flank pain, dysuria, known exposures to STDs.  Not tried anything over-the-counter for symptoms.   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   Late prenatal care affecting pregnancy, antepartum, third trimester 02/17/2020   Insufficient prenatal care in third trimester 02/17/2020   Supervision of low-risk pregnancy 08/18/2019   Obesity (BMI 30.0-34.9) 08/18/2019    Past Surgical History:  Procedure Laterality Date   CESAREAN SECTION  03/12/2020   Procedure: CESAREAN SECTION;  Surgeon: Warden Fillers, MD;  Location: [facility];  Service: Obstetrics;;   NO PAST SURGERIES      OB History     Gravida  2   Para  2   Term  2   Preterm      AB      Living  2      SAB      IAB      Ectopic      Multiple  0   Live Births  2            Home Medications    Prior to Admission medications   Medication Sig Start Date End Date Taking? Authorizing Provider  clindamycin (CLEOCIN) 300 MG capsule Take 1 capsule (300 mg total)  by mouth 2 (two) times daily. 12/24/20  Yes Particia Nearing, PA-C  acetaminophen (TYLENOL) 325 MG tablet TAKE 2 TABLETS (650 MG TOTAL) BY MOUTH EVERY FOUR HOURS AS NEEDED FOR MILD PAIN (TEMPERATURE > 101.5.). 03/15/20 03/15/21  Raelyn Mora, CNM  ibuprofen (ADVIL) 800 MG tablet TAKE 1 TABLET (800 MG TOTAL) BY MOUTH EVERY EIGHT HOURS. 03/15/20 03/15/21  Raelyn Mora, CNM  metroNIDAZOLE (FLAGYL) 500 MG tablet Take 1 tablet (500 mg total) by mouth 2 (two) times daily. 11/08/20   Rhys Martini, PA-C  nitrofurantoin, macrocrystal-monohydrate, (MACROBID) 100 MG capsule Take 1 capsule (100 mg total) by mouth 2 (two) times daily. 10/21/20   Raspet, Noberto Retort, PA-C  Prenatal Vit-Fe Fumarate-FA (MULTIVITAMIN-PRENATAL) 27-0.8 MG TABS tablet Take 1 tablet by mouth daily at 12 noon. Patient not taking: No sig reported    [provider]    Family History Family History  Problem Relation Age of Onset   Healthy Mother    Diabetes Father    Hypertension Father     Social History Social History   Tobacco Use   Smoking status: Never   Smokeless tobacco: Never  Vaping Use   Vaping Use: Some days  Substance Use Topics  Alcohol use: Yes    Comment: occasionally   Drug use: Never     Allergies   Patient has no known allergies.   Review of Systems Review of Systems Per HPI  Physical Exam Triage Vital Signs ED Triage Vitals  Enc Vitals Group     BP 12/24/20 1936 120/71     Pulse Rate 12/24/20 1936 74     Resp 12/24/20 1936 18     Temp 12/24/20 1936 98 F (36.7 C)     Temp Source 12/24/20 1936 Oral     SpO2 12/24/20 1936 98 %     Weight --      Height --      Head Circumference --      Peak Flow --      Pain Score 12/24/20 1934 6     Pain Loc --      Pain Edu? --      Excl. in GC? --    No data found.  Updated Vital Signs BP 120/71 (BP Location: Right Arm)   Pulse 74   Temp 98 F (36.7 C) (Oral)   Resp 18   LMP 12/09/2020   SpO2 98%   Visual  Acuity Right Eye Distance:   Left Eye Distance:   Bilateral Distance:    Right Eye Near:   Left Eye Near:    Bilateral Near:     Physical Exam Vitals and nursing note reviewed.  Constitutional:      Appearance: Normal appearance. She is not ill-appearing.  HENT:     Head: Atraumatic.     Mouth/Throat:     Mouth: Mucous membranes are moist.  Eyes:     Extraocular Movements: Extraocular movements intact.     Conjunctiva/sclera: Conjunctivae normal.  Cardiovascular:     Rate and Rhythm: Normal rate and regular rhythm.     Heart sounds: Normal heart sounds.  Pulmonary:     Effort: Pulmonary effort is normal.     Breath sounds: Normal breath sounds.  Abdominal:     General: Bowel sounds are normal. There is no distension.     Palpations: Abdomen is soft.     Tenderness: There is no abdominal tenderness. There is no right CVA tenderness, left CVA tenderness or guarding.  Genitourinary:    Comments: GU exam deferred, self swab performed Musculoskeletal:        General: Normal range of motion.     Cervical back: Normal range of motion and neck supple.  Skin:    General: Skin is warm and dry.  Neurological:     Mental Status: She is alert and oriented to person, place, and time.  Psychiatric:        Mood and Affect: Mood normal.        Thought Content: Thought content normal.        Judgment: Judgment normal.     UC Treatments / Results  Labs (all labs ordered are listed, but only abnormal results are displayed) Labs Reviewed  POCT URINALYSIS DIPSTICK, ED / UC - Abnormal; Notable for the following components:      Result Value   Ketones, ur 40 (*)    Hgb urine dipstick MODERATE (*)    All other components within normal limits  POC URINE PREG, ED  CERVICOVAGINAL ANCILLARY ONLY    EKG   Radiology No results found.  Procedures Procedures (including critical care time)  Medications Ordered in UC Medications - No data to display  Initial Impression /  Assessment and Plan / UC Course  I have reviewed the triage vital signs and the nursing notes.  Pertinent labs & imaging results that were available during my care of the patient were reviewed by me and considered in my medical decision making (see chart for details).     Vital signs and exam benign and reassuring, urine pregnancy negative, urinalysis without evidence of a urinary tract infection.  Vaginal swab pending.  Given symptoms consistent with BV, will treat with clindamycin while awaiting the remainder of results.  Discussed abstinence, good vaginal hygiene, safe sexual practices and will treat based on results of swab.  Follow-up if worsening symptoms.  Final Clinical Impressions(s) / UC Diagnoses   Final diagnoses:  Vaginal bleeding  Acute vaginitis  Screening for STDs (sexually transmitted diseases)  Abdominal cramping   Discharge Instructions   None    ED Prescriptions     Medication Sig Dispense Auth. Provider   clindamycin (CLEOCIN) 300 MG capsule Take 1 capsule (300 mg total) by mouth 2 (two) times daily. 14 capsule Particia Nearing, [place]      PDMP not reviewed this encounter.   Particia Nearing, PA-C 12/24/20 2006    [address] [place], [place] 12/24/20 2007

## 2020-12-27 LAB — CERVICOVAGINAL ANCILLARY ONLY
Bacterial Vaginitis (gardnerella): POSITIVE — AB
Candida Glabrata: NEGATIVE
Candida Vaginitis: NEGATIVE
Chlamydia: NEGATIVE
Comment: NEGATIVE
Comment: NEGATIVE
Comment: NEGATIVE
Comment: NEGATIVE
Comment: NEGATIVE
Comment: NORMAL
Neisseria Gonorrhea: NEGATIVE
Trichomonas: NEGATIVE

## 2021-01-06 ENCOUNTER — Encounter: Payer: Medicaid Other | Admitting: Obstetrics

## 2021-01-11 ENCOUNTER — Telehealth: Payer: Self-pay | Admitting: Family

## 2021-01-28 ENCOUNTER — Other Ambulatory Visit (HOSPITAL_COMMUNITY)
Admission: RE | Admit: 2021-01-28 | Discharge: 2021-01-28 | Disposition: A | Discharge status: Home / Self Care | Payer: Medicaid Other | Source: Ambulatory Visit | Attending: Family Medicine | Admitting: Family Medicine

## 2021-01-28 ENCOUNTER — Encounter: Payer: Self-pay | Admitting: Family

## 2021-01-28 ENCOUNTER — Other Ambulatory Visit: Payer: Self-pay

## 2021-01-28 ENCOUNTER — Ambulatory Visit (INDEPENDENT_AMBULATORY_CARE_PROVIDER_SITE_OTHER): Payer: Medicaid Other | Admitting: Family

## 2021-01-28 VITALS — BP 105/67 | HR 77 | Ht 60.0 in | Wt 172.4 lb

## 2021-01-28 DIAGNOSIS — Z3202 Encounter for pregnancy test, result negative: Secondary | ICD-10-CM

## 2021-01-28 DIAGNOSIS — Z01419 Encounter for gynecological examination (general) (routine) without abnormal findings: Secondary | ICD-10-CM

## 2021-01-28 LAB — POCT URINALYSIS DIP (DEVICE)
Bilirubin Urine: NEGATIVE
Glucose, UA: NEGATIVE mg/dL
Ketones, ur: NEGATIVE mg/dL
Leukocytes,Ua: NEGATIVE
Nitrite: NEGATIVE
Protein, ur: NEGATIVE mg/dL
Specific Gravity, Urine: 1.02 (ref 1.005–1.030)
Urobilinogen, UA: 1 mg/dL (ref 0.0–1.0)
pH: 7.5 (ref 5.0–8.0)

## 2021-01-28 LAB — POCT PREGNANCY, URINE: Preg Test, Ur: NEGATIVE

## 2021-01-28 NOTE — Progress Notes (Signed)
History:  [name] is a 25 y.o. [id number] who presents to clinic today for well woman exam.  Patient's last menstrual period was 01/13/2021 (approximate). Normal.  Bleeding returned in one week and lasted for three days, described as light. Bleeding returned again on 01/19/21 and lasted for two days, described as light.  Menses started at 25 yo, normal up until September 2022.  Increased stress in past month.  Desires STI screening.    Birth control method hx - depo at 25 yo until 25 yo.  Does not intend pregnancy in next year.  Using condoms.  Desires to continue condom use.    +vaginal odor; +sexually active, with partner x 6 months.      The following portions of the patient's history were reviewed and updated as appropriate: allergies, current medications, family history, past medical history, social history, past surgical history and problem list.  Review of Systems:  Review of Systems  Constitutional:  Negative for fatigue and fever.  HENT: Negative.    Respiratory:  Negative for cough and shortness of breath.   Cardiovascular: Negative.   Gastrointestinal:  Negative for diarrhea, nausea and vomiting.  Genitourinary:  Negative for dysuria, pelvic pain and vaginal bleeding.  Neurological:  Negative for dizziness, light-headedness and headaches.   +vaginal odor   Objective:  Physical Exam BP 105/67   Pulse 77   Ht 5' (1.524 m)   Wt 172 lb 6.4 oz (78.2 kg)   LMP 01/13/2021 (Approximate)   Breastfeeding No   BMI 33.67 kg/m  Physical Exam BP 105/67   Pulse 77   Ht 5' (1.524 m)   Wt 172 lb 6.4 oz (78.2 kg)   LMP 01/13/2021 (Approximate)   Breastfeeding No   BMI 33.67 kg/m  General appearance: alert, cooperative and appears stated age Head: Normocephalic, without obvious abnormality, atraumatic Neck: no adenopathy, no carotid bruit, no JVD, supple, symmetrical, trachea midline and thyroid not enlarged, symmetric, no tenderness/mass/nodules Lungs: clear to auscultation  bilaterally Breasts: normal appearance, no masses or tenderness, No nipple retraction or dimpling, No nipple discharge or bleeding, No axillary or supraclavicular adenopathy, Normal to palpation without dominant masses, Taught monthly breast self examination Heart: regular rate and rhythm, S1, S2 normal, no murmur, click, rub or gallop Abdomen: soft, non-tender; bowel sounds normal; no masses,  no organomegaly Pelvic: cervix normal in appearance, external genitalia normal, no adnexal masses or tenderness, no cervical motion tenderness, rectovaginal septum normal, uterus normal size, shape, and consistency and vagina with white discharge, no odor noted Skin: Skin color, texture, turgor normal. No rashes or lesions      Labs and Imaging Results for orders placed or performed in visit on 01/28/21 (from the past 24 hour(s))  Pregnancy, urine POC     Status: None   Collection Time: 01/28/21 11:37 AM  Result Value Ref Range   Preg Test, Ur NEGATIVE NEGATIVE    No results found.   Assessment & Plan:  1. Well woman exam - Cytology - PAP( [place]) - Cervicovaginal ancillary only( [place]) - Urine Culture  Reviewed that there are other family planning options available when ready to discuss  Amedeo Gory, CNM 01/28/2021 11:52 AM

## 2021-01-31 LAB — CERVICOVAGINAL ANCILLARY ONLY
Bacterial Vaginitis (gardnerella): NEGATIVE
Candida Glabrata: NEGATIVE
Candida Vaginitis: NEGATIVE
Comment: NEGATIVE
Comment: NEGATIVE
Comment: NEGATIVE

## 2021-02-01 LAB — URINE CULTURE

## 2021-02-02 LAB — CYTOLOGY - PAP
Adequacy: ABSENT
Chlamydia: NEGATIVE
Comment: NEGATIVE
Comment: NEGATIVE
Comment: NORMAL
Diagnosis: NEGATIVE
Diagnosis: REACTIVE
Neisseria Gonorrhea: NEGATIVE
Trichomonas: NEGATIVE

## 2021-02-08 ENCOUNTER — Encounter: Payer: Medicaid Other | Admitting: Obstetrics

## 2021-02-21 ENCOUNTER — Ambulatory Visit (HOSPITAL_COMMUNITY)
Admission: EM | Admit: 2021-02-21 | Discharge: 2021-02-21 | Disposition: A | Discharge status: Home / Self Care | Payer: Medicaid Other | Attending: Emergency Medicine | Admitting: Emergency Medicine

## 2021-02-21 ENCOUNTER — Other Ambulatory Visit: Payer: Self-pay

## 2021-02-21 ENCOUNTER — Encounter (HOSPITAL_COMMUNITY): Payer: Self-pay

## 2021-02-21 DIAGNOSIS — N898 Other specified noninflammatory disorders of vagina: Secondary | ICD-10-CM | POA: Insufficient documentation

## 2021-02-21 DIAGNOSIS — R300 Dysuria: Secondary | ICD-10-CM | POA: Diagnosis not present

## 2021-02-21 LAB — POCT URINALYSIS DIPSTICK, ED / UC
Bilirubin Urine: NEGATIVE
Glucose, UA: NEGATIVE mg/dL
Ketones, ur: NEGATIVE mg/dL
Leukocytes,Ua: NEGATIVE
Nitrite: NEGATIVE
Protein, ur: NEGATIVE mg/dL
Specific Gravity, Urine: 1.02 (ref 1.005–1.030)
Urobilinogen, UA: 0.2 mg/dL (ref 0.0–1.0)
pH: 6 (ref 5.0–8.0)

## 2021-02-21 LAB — POC URINE PREG, ED: Preg Test, Ur: NEGATIVE

## 2021-02-21 LAB — HIV ANTIBODY (ROUTINE TESTING W REFLEX): HIV Screen 4th Generation wRfx: NONREACTIVE

## 2021-02-21 NOTE — Discharge Instructions (Signed)
You will get a call with positive results with instructions/any prescriptions - if all results are negative, you will not get a call but you can check your results in MyChart if you have a MyChart account.

## 2021-02-21 NOTE — ED Triage Notes (Signed)
Pt c/o yeasty vaginal discharge x 1 week.  Would also like checked for STD d/t unprotected sex.  Feels burning and odor with urination.  Pt wants tested for everything and would like to receive Rx during visit.

## 2021-02-21 NOTE — ED Provider Notes (Signed)
[facility]    CSN: [id number] Arrival date & time: 02/21/21  1112      History   Chief Complaint Chief Complaint  Patient presents with   Vaginal Discharge    HPI Jamie Neal is a 25 y.o. female. Pt reports a new sex partner with whom she's had unprotected sex, dysuria and vaginal discharge x 1 day. LMP 02/09/21, does not believe she could be pregnant. Wants to be tested for STIs and checked for UTI.    Vaginal Discharge Associated symptoms: dysuria   Associated symptoms: no abdominal pain, no fever, no nausea and no vomiting    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   Late prenatal care affecting pregnancy, antepartum, third trimester 02/17/2020   Insufficient prenatal care in third trimester 02/17/2020   Supervision of low-risk pregnancy 08/18/2019   Obesity (BMI 30.0-34.9) 08/18/2019    Past Surgical History:  Procedure Laterality Date   CESAREAN SECTION  03/12/2020   Procedure: CESAREAN SECTION;  Surgeon: Warden Fillers, MD;  Location: [facility];  Service: Obstetrics;;   NO PAST SURGERIES      OB History     Gravida  2   Para  2   Term  2   Preterm      AB      Living  2      SAB      IAB      Ectopic      Multiple  0   Live Births  2            Home Medications    Prior to Admission medications   Medication Sig Start Date End Date Taking? Authorizing Provider  acetaminophen (TYLENOL) 325 MG tablet TAKE 2 TABLETS (650 MG TOTAL) BY MOUTH EVERY FOUR HOURS AS NEEDED FOR MILD PAIN (TEMPERATURE > 101.5.). Patient not taking: Reported on 01/28/2021 03/15/20 03/15/21  Raelyn Mora, CNM  clindamycin (CLEOCIN) 300 MG capsule Take 1 capsule (300 mg total) by mouth 2 (two) times daily. Patient not taking: Reported on 01/28/2021 12/24/20    Particia Nearing, PA-C  ibuprofen (ADVIL) 800 MG tablet TAKE 1 TABLET (800 MG TOTAL) BY MOUTH EVERY EIGHT HOURS. Patient not taking: Reported on 01/28/2021 03/15/20 03/15/21  Raelyn Mora, CNM  metroNIDAZOLE (FLAGYL) 500 MG tablet Take 1 tablet (500 mg total) by mouth 2 (two) times daily. Patient not taking: Reported on 01/28/2021 11/08/20   Rhys Martini, PA-C  nitrofurantoin, macrocrystal-monohydrate, (MACROBID) 100 MG capsule Take 1 capsule (100 mg total) by mouth 2 (two) times daily. Patient not taking: Reported on 01/28/2021 10/21/20   Raspet, Noberto Retort, PA-C  Prenatal Vit-Fe Fumarate-FA (MULTIVITAMIN-PRENATAL) 27-0.8 MG TABS tablet Take 1 tablet by mouth daily at 12 noon. Patient not taking: No sig reported    [provider]    Family History Family History  Problem Relation Age of Onset   Healthy Mother    Diabetes Father    Hypertension Father     Social History Social History   Tobacco Use   Smoking status: Never   Smokeless tobacco: Never  Vaping Use   Vaping Use: Some days  Substance Use Topics   Alcohol use: Yes    Comment: occasionally   Drug use: Never     Allergies  Patient has no known allergies.   Review of Systems Review of Systems  Constitutional:  Negative for chills and fever.  Gastrointestinal:  Negative for abdominal pain, nausea and vomiting.  Genitourinary:  Positive for dysuria and vaginal discharge. Negative for flank pain.    Physical Exam Triage Vital Signs ED Triage Vitals  Enc Vitals Group     BP 02/21/21 1346 111/60     Pulse Rate 02/21/21 1346 94     Resp 02/21/21 1346 16     Temp 02/21/21 1346 97.7 F (36.5 C)     Temp Source 02/21/21 1346 Oral     SpO2 02/21/21 1346 97 %     Weight --      Height --      Head Circumference --      Peak Flow --      Pain Score 02/21/21 1347 0     Pain Loc --      Pain Edu? --      Excl. in GC? --    No data found.  Updated Vital Signs BP 111/60 (BP Location: Left  Arm)   Pulse 94   Temp 97.7 F (36.5 C) (Oral)   Resp 16   LMP  (LMP Unknown) Comment: "sometime last month"  SpO2 97%   Visual Acuity Right Eye Distance:   Left Eye Distance:   Bilateral Distance:    Right Eye Near:   Left Eye Near:    Bilateral Near:     Physical Exam Constitutional:      Appearance: Normal appearance. She is obese.     Comments: Pt tearful - she states she not upset about reason for UC visit and does not want to discuss it.   Pulmonary:     Effort: Pulmonary effort is normal.  Neurological:     Mental Status: She is alert.     UC Treatments / Results  Labs (all labs ordered are listed, but only abnormal results are displayed) Labs Reviewed  POCT URINALYSIS DIPSTICK, ED / UC - Abnormal; Notable for the following components:      Result Value   Hgb urine dipstick TRACE (*)    All other components within normal limits  URINE CULTURE  HIV ANTIBODY (ROUTINE TESTING W REFLEX)  RPR  POC URINE PREG, ED  CERVICOVAGINAL ANCILLARY ONLY    EKG   Radiology No results found.  Procedures Procedures (including critical care time)  Medications Ordered in UC Medications - No data to display  Initial Impression / Assessment and Plan / UC Course  I have reviewed the triage vital signs and the nursing notes.  Pertinent labs & imaging results that were available during my care of the patient were reviewed by me and considered in my medical decision making (see chart for details).    No UTI on UA. Will send culture. STI test results pending.    Final Clinical Impressions(s) / UC Diagnoses   Final diagnoses:  Vaginal discharge  Dysuria     Discharge Instructions      You will get a call with positive results with instructions/any prescriptions - if all results are negative, you will not get a call but you can check your results in MyChart if you have a MyChart account.   ED Prescriptions   None    PDMP not reviewed this encounter.    Cathlyn Parsons, NP 02/21/21 1433

## 2021-02-22 LAB — CERVICOVAGINAL ANCILLARY ONLY
Bacterial Vaginitis (gardnerella): POSITIVE — AB
Candida Glabrata: NEGATIVE
Candida Vaginitis: NEGATIVE
Chlamydia: NEGATIVE
Comment: NEGATIVE
Comment: NEGATIVE
Comment: NEGATIVE
Comment: NEGATIVE
Comment: NEGATIVE
Comment: NORMAL
Neisseria Gonorrhea: NEGATIVE
Trichomonas: NEGATIVE

## 2021-02-22 LAB — URINE CULTURE: Culture: >=100000 — AB

## 2021-02-24 ENCOUNTER — Telehealth (HOSPITAL_COMMUNITY): Payer: Self-pay | Admitting: Emergency Medicine

## 2021-02-24 NOTE — Telephone Encounter (Signed)
Pt is requesting medication treatment for BV. Please advise. Thanks

## 2021-02-25 ENCOUNTER — Telehealth (HOSPITAL_COMMUNITY): Payer: Self-pay | Admitting: Emergency Medicine

## 2021-02-25 ENCOUNTER — Other Ambulatory Visit (HOSPITAL_COMMUNITY): Payer: Self-pay

## 2021-02-25 MED ORDER — NITROFURANTOIN MONOHYD MACRO 100 MG PO CAPS
100.0000 mg | ORAL_CAPSULE | Freq: Two times a day (BID) | ORAL | 0 refills | Status: DC
  Filled 2021-02-25: qty 10, 5d supply, fill #0

## 2021-02-25 MED ORDER — METRONIDAZOLE 500 MG PO TABS
500.0000 mg | ORAL_TABLET | Freq: Two times a day (BID) | ORAL | 0 refills | Status: DC
  Filled 2021-02-25 – 2021-02-28 (×2): qty 14, 7d supply, fill #0

## 2021-02-25 NOTE — Telephone Encounter (Signed)
Urine positive for group B strep.  Will treat with macrobid twice a day for the next 5 days.  Patient also tested positive for BV and will require treatment with flagyl twice a day for the next 7 days.  Prescriptions sent to the pharmacy on file.

## 2021-02-28 ENCOUNTER — Other Ambulatory Visit (HOSPITAL_COMMUNITY): Payer: Self-pay

## 2021-03-02 ENCOUNTER — Telehealth: Payer: Self-pay | Admitting: Emergency Medicine

## 2021-03-02 MED ORDER — METRONIDAZOLE 500 MG PO TABS: 500.0000 mg | ORAL_TABLET | Freq: Two times a day (BID) | ORAL | 0 refills | Status: DC

## 2021-03-02 MED ORDER — NITROFURANTOIN MONOHYD MACRO 100 MG PO CAPS: 100.0000 mg | ORAL_CAPSULE | Freq: Two times a day (BID) | ORAL | 0 refills | Status: DC

## 2021-03-02 NOTE — Telephone Encounter (Signed)
Medications were sent to the wrong pharmacy, patient requested resend.

## 2021-03-21 ENCOUNTER — Encounter (HOSPITAL_COMMUNITY): Payer: Self-pay | Admitting: *Deleted

## 2021-03-21 ENCOUNTER — Ambulatory Visit (HOSPITAL_COMMUNITY)
Admission: EM | Admit: 2021-03-21 | Discharge: 2021-03-21 | Disposition: A | Discharge status: Home / Self Care | Payer: Medicaid Other | Attending: Internal Medicine | Admitting: Internal Medicine

## 2021-03-21 ENCOUNTER — Other Ambulatory Visit: Payer: Self-pay

## 2021-03-21 DIAGNOSIS — R300 Dysuria: Secondary | ICD-10-CM | POA: Insufficient documentation

## 2021-03-21 LAB — POCT URINALYSIS DIPSTICK, ED / UC
Bilirubin Urine: NEGATIVE
Glucose, UA: NEGATIVE mg/dL
Hgb urine dipstick: NEGATIVE
Leukocytes,Ua: NEGATIVE
Nitrite: NEGATIVE
Protein, ur: 30 mg/dL — AB
Specific Gravity, Urine: 1.02 (ref 1.005–1.030)
Urobilinogen, UA: 1 mg/dL (ref 0.0–1.0)
pH: 7.5 (ref 5.0–8.0)

## 2021-03-21 LAB — HIV ANTIBODY (ROUTINE TESTING W REFLEX): HIV Screen 4th Generation wRfx: NONREACTIVE

## 2021-03-21 LAB — POC URINE PREG, ED: Preg Test, Ur: NEGATIVE

## 2021-03-21 IMAGING — US US OB COMP LESS 14 WK
1 series · 15 of 28 positions shown · non-contrast
Comparison: None.

CLINICAL DATA: Pelvic cramping, first trimester of pregnancy.

EXAM:
OBSTETRIC <14 WK ULTRASOUND
TECHNIQUE: Transabdominal ultrasound was performed for evaluation of the
gestation as well as the maternal uterus and adnexal regions.

[Series 1: us ob comp less 14 wk · 15 of 32 slices shown]
[im 1/32]
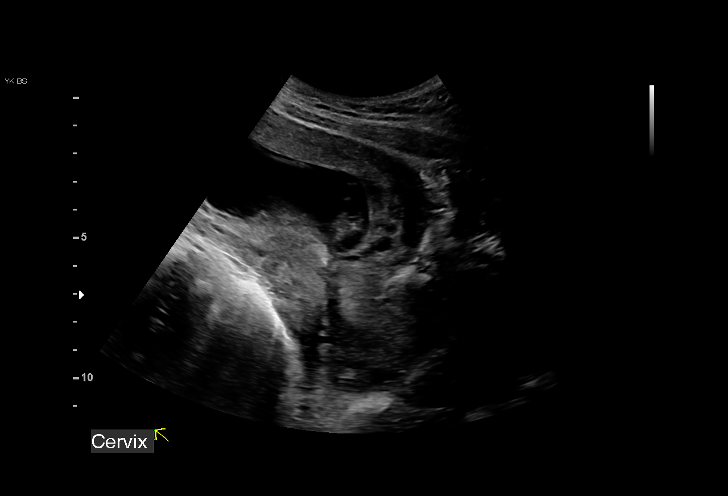
[im 3/32]
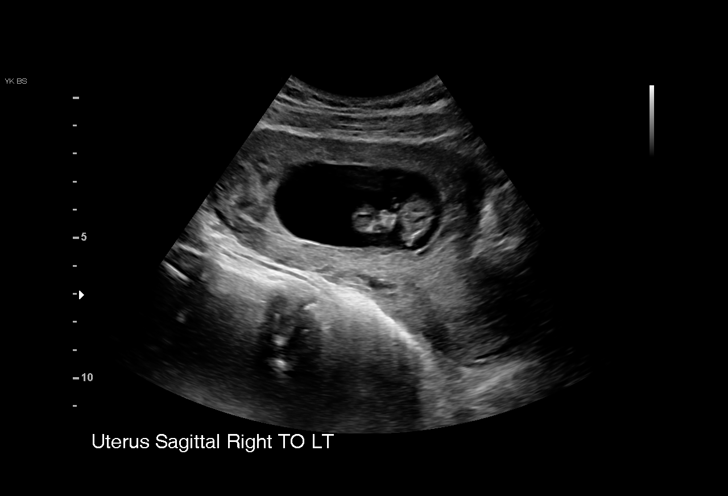
[im 5/32]
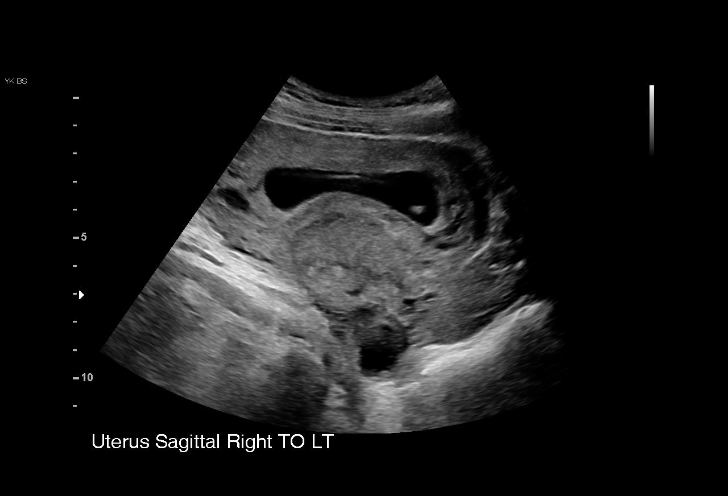
[im 7/32]
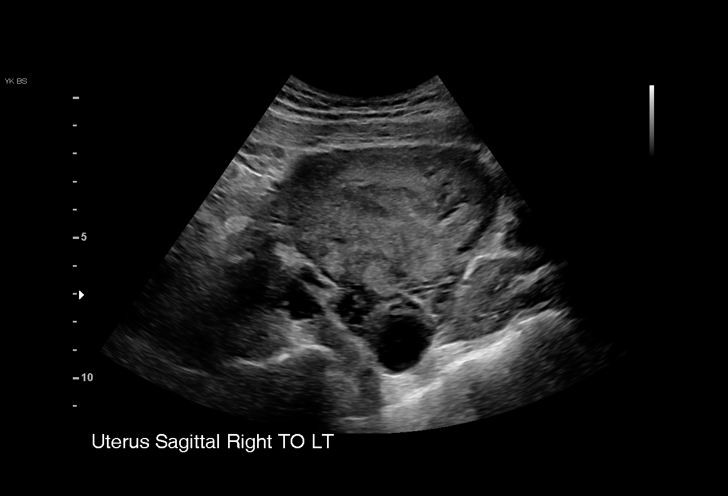
[im 10/32]
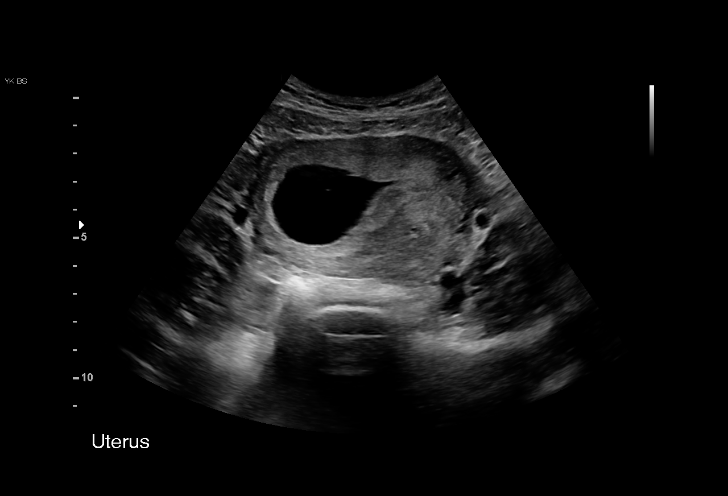
[im 12/32]
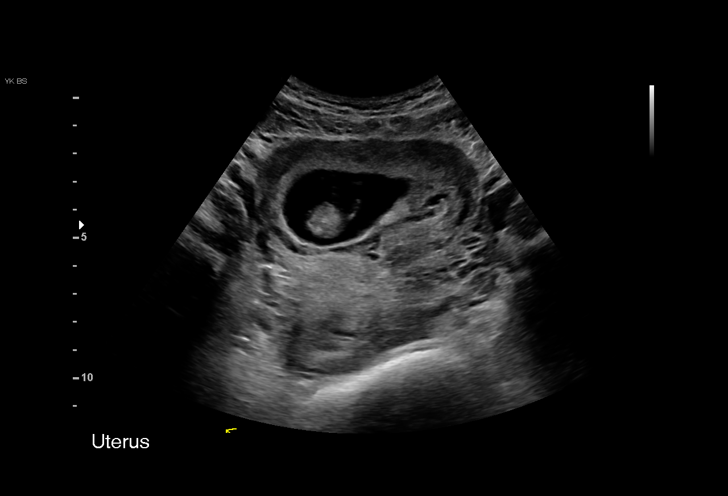
[im 14/32]
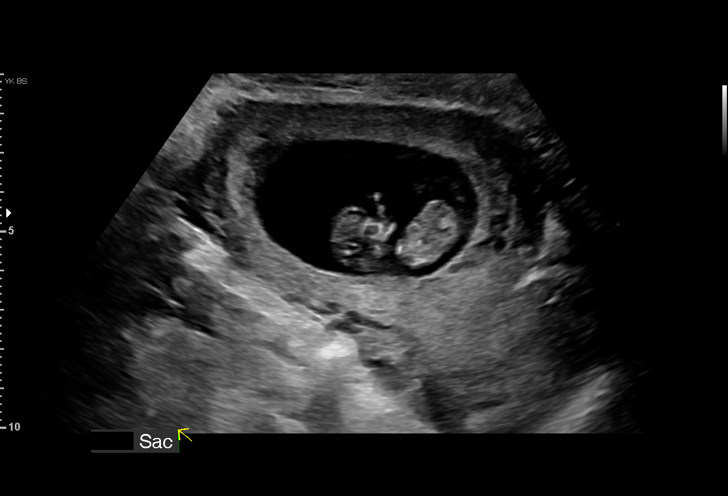
[im 17/32]
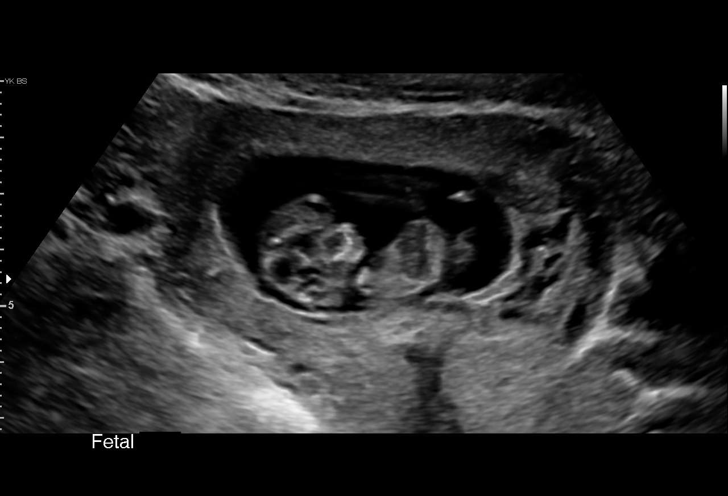
[im 18/32]
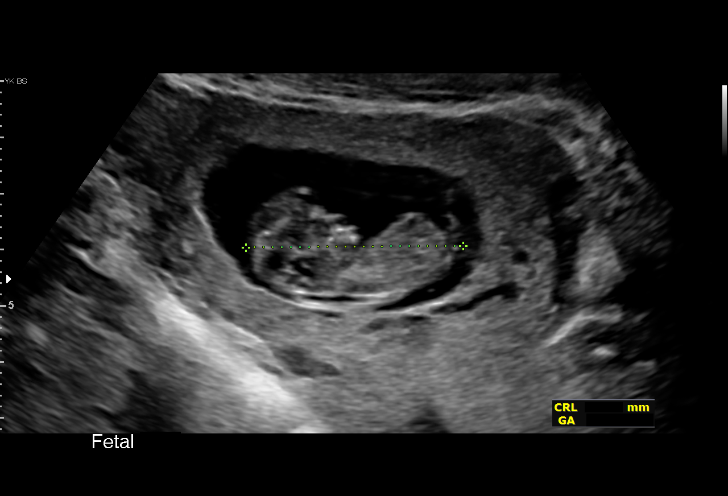
[im 20/32]
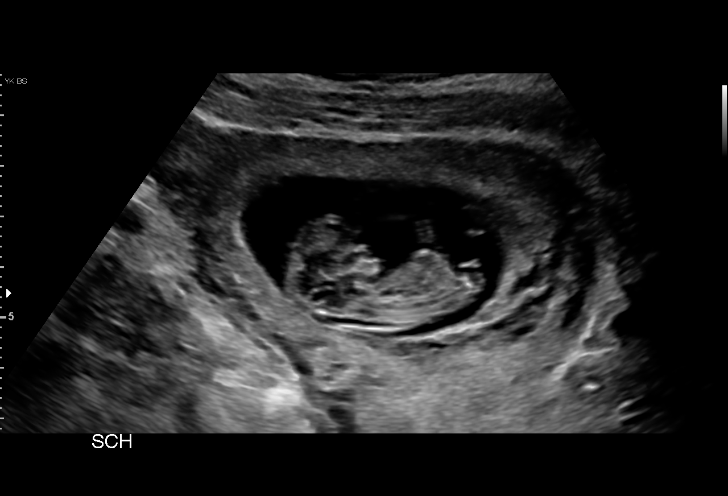
[im 22/32]
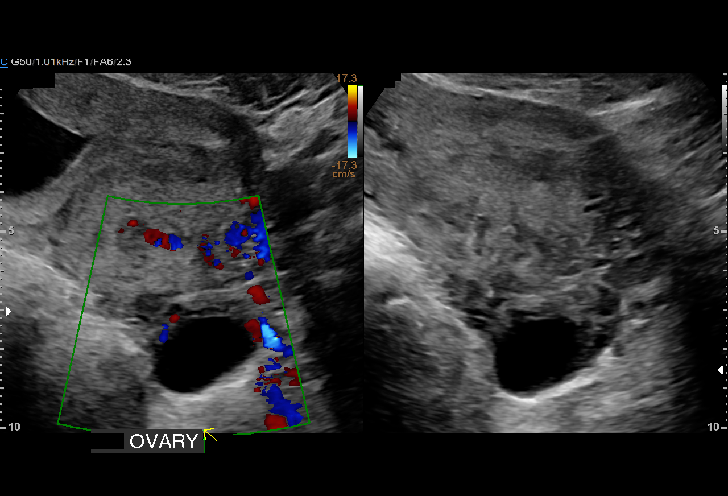
[im 25/32]
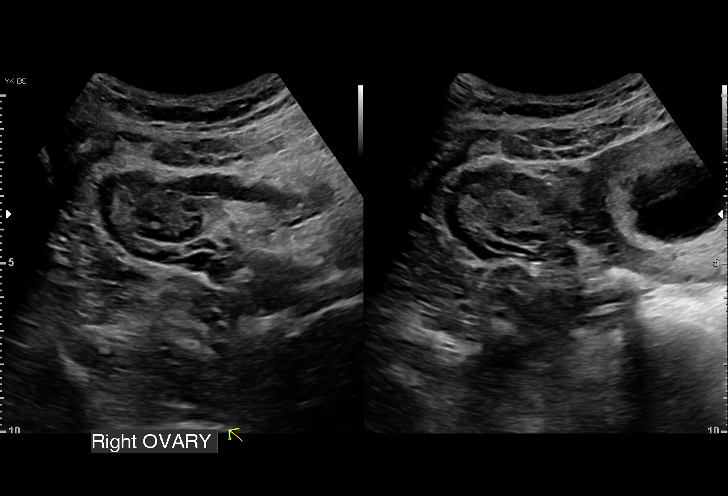
[im 27/32]
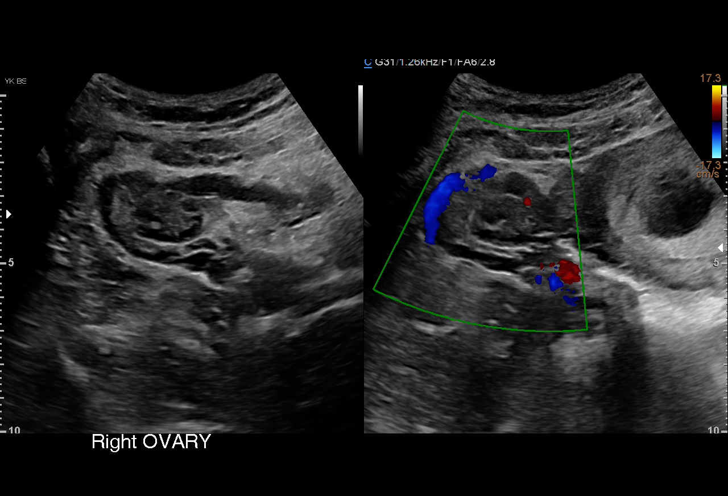
[im 29/32]
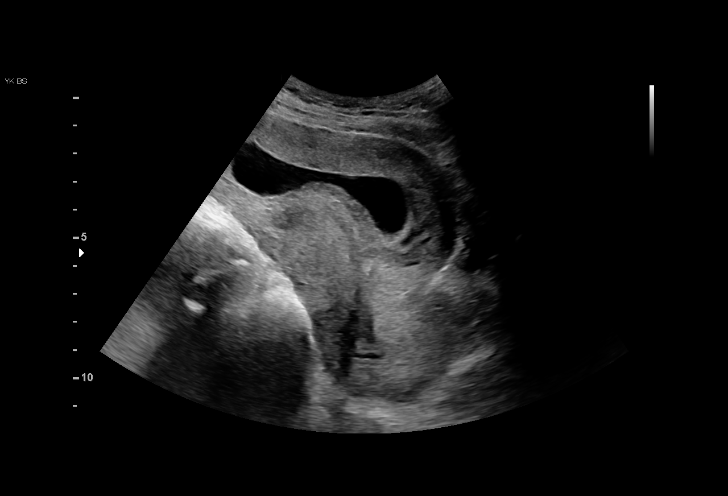
[im 32/32]
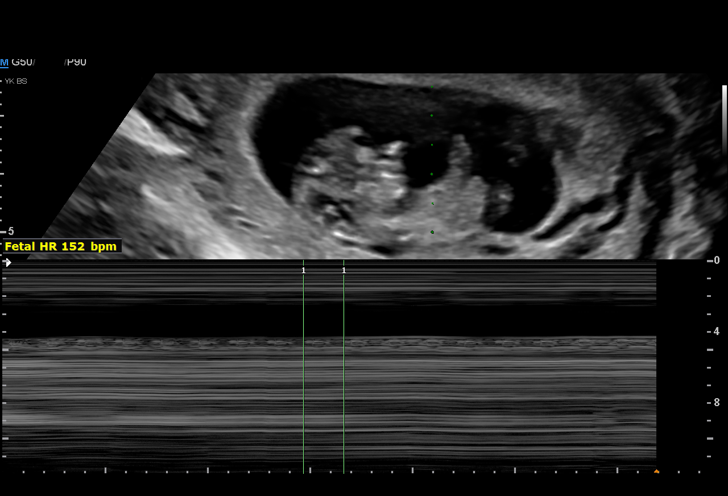

[15 of 28 positions shown; findings below may reference images not displayed]

FINDINGS: Intrauterine gestational sac: Single

Yolk sac:  Visualized.

Embryo:  Visualized.

Cardiac Activity: Visualized.

Heart Rate: 146 bpm

CRL:   40 mm   10 w 6 d                  US EDC: March 07, 2020.

Subchorionic hemorrhage:  Small subchorionic hemorrhage is noted.

Maternal uterus/adnexae: Corpus luteum cyst is seen in the left
ovary. Right ovary is unremarkable. No free fluid is noted.
IMPRESSION: Single live intrauterine gestation of 10 weeks 6 days. Small
subchorionic hemorrhage is noted.

## 2021-03-21 NOTE — Discharge Instructions (Addendum)
Your urine pregnancy test is negative  Your urinalysis shows negative for infection   Labs pending 2-3 days, you will be contacted if positive for any sti and treatment will be sent to the pharmacy, you will have to return to the clinic if positive for gonorrhea to receive treatment   Please refrain from having sex until labs results, if positive please refrain from having sex until treatment complete and symptoms resolve   If positive for HIV, Syphilis, Chlamydia  gonorrhea or trichomoniasis please notify partner or partners so they may tested as well  Moving forward, it is recommended you use some form of protection against the transmission of sti infections  such as condoms or dental dams with each sexual encounter

## 2021-03-21 NOTE — ED Triage Notes (Signed)
Pt wants STD work up  with dysuria.

## 2021-03-21 NOTE — ED Provider Notes (Signed)
[facility]    CSN: [id number] Arrival date & time: 03/21/21  1545      History   Chief Complaint Chief Complaint  Patient presents with   SEXUALLY TRANSMITTED DISEASE    HPI Jamie Neal is a 25 y.o. female.   Patient presents with urinary frequency 1 week, dysuria for 1 day and brown discharge for 3 days. Sexually active, 1 partner, sometime condom use. No known exposure.  Denies vaginal itching or irritation, vaginal odor, new rash or lesion, abdominal pain, flank pain, urinary urgency, hematuria.  Concern partner may be sleeping with other people.  History of herpes.   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   Late prenatal care affecting pregnancy, antepartum, third trimester 02/17/2020   Insufficient prenatal care in third trimester 02/17/2020   Supervision of low-risk pregnancy 08/18/2019   Obesity (BMI 30.0-34.9) 08/18/2019    Past Surgical History:  Procedure Laterality Date   CESAREAN SECTION  03/12/2020   Procedure: CESAREAN SECTION;  Surgeon: Warden FillersBass, Lawrence A, MD;  Location: [facility];  Service: Obstetrics;;   NO PAST SURGERIES      OB History     Gravida  2   Para  2   Term  2   Preterm      AB      Living  2      SAB      IAB      Ectopic      Multiple  0   Live Births  2            Home Medications    Prior to Admission medications   Medication Sig Start Date End Date Taking? Authorizing Provider  metroNIDAZOLE (FLAGYL) 500 MG tablet Take 1 tablet (500 mg total) by mouth 2 (two) times daily. 03/02/21   LampteyBritta Mccreedy, Philip O, MD  nitrofurantoin, macrocrystal-monohydrate, (MACROBID) 100 MG capsule Take 1 capsule (100 mg total) by mouth 2 (two) times daily. 03/02/21   LampteyBritta Mccreedy, Philip O, MD  Prenatal Vit-Fe Fumarate-FA  (MULTIVITAMIN-PRENATAL) 27-0.8 MG TABS tablet Take 1 tablet by mouth daily at 12 noon. Patient not taking: No sig reported    [provider]    Family History Family History  Problem Relation Age of Onset   Healthy Mother    Diabetes Father    Hypertension Father     Social History Social History   Tobacco Use   Smoking status: Never   Smokeless tobacco: Never  Vaping Use   Vaping Use: Some days  Substance Use Topics   Alcohol use: Yes    Comment: occasionally   Drug use: Never     Allergies   Patient has no known allergies.   Review of Systems Review of Systems  Constitutional: Negative.   Respiratory: Negative.    Genitourinary:  Positive for dysuria, frequency and vaginal discharge. Negative for decreased urine volume, difficulty urinating, dyspareunia, enuresis, flank pain, genital sores, hematuria, menstrual problem, pelvic pain, urgency, vaginal bleeding and vaginal pain.  Skin: Negative.   Neurological: Negative.     Physical Exam Triage Vital Signs ED Triage Vitals  Enc Vitals Group     BP 03/21/21 1638 (!) 117/92     Pulse Rate 03/21/21 1638 67     Resp 03/21/21 1638 18  Temp 03/21/21 1638 98.8 F (37.1 C)     Temp src --      SpO2 03/21/21 1638 100 %     Weight --      Height --      Head Circumference --      Peak Flow --      Pain Score 03/21/21 1635 0     Pain Loc --      Pain Edu? --      Excl. in [place]? --    No data found.  Updated Vital Signs BP (!) 117/92   Pulse 67   Temp 98.8 F (37.1 C)   Resp 18   LMP 03/01/2021 (Approximate) Comment: "sometime last month"  SpO2 100%   Visual Acuity Right Eye Distance:   Left Eye Distance:   Bilateral Distance:    Right Eye Near:   Left Eye Near:    Bilateral Near:     Physical Exam Constitutional:      Appearance: Normal appearance. She is normal weight.  Eyes:     Extraocular Movements: Extraocular movements intact.  Pulmonary:     Effort: Pulmonary effort is  normal.  Genitourinary:    Comments: Deferred self collect vaginal swab Skin:    General: Skin is warm and dry.  Neurological:     Mental Status: She is alert and oriented to person, place, and time.  Psychiatric:        Mood and Affect: Mood normal.        Behavior: Behavior normal.     UC Treatments / Results  Labs (all labs ordered are listed, but only abnormal results are displayed) Labs Reviewed  CERVICOVAGINAL ANCILLARY ONLY    EKG   Radiology No results found.  Procedures Procedures (including critical care time)  Medications Ordered in UC Medications - No data to display  Initial Impression / Assessment and Plan / UC Course  I have reviewed the triage vital signs and the nursing notes.  Pertinent labs & imaging results that were available during my care of the patient were reviewed by me and considered in my medical decision making (see chart for details).  Dysuria  1.  Urinalysis negative, sent for culture 2.  STI screening pending, will treat per protocol, advised abstinence until labs result, treatment is complete and all symptoms have resolved, advised condom use moving forward 3.  Urgent care follow-up as needed 4.  Urine pregnancy negative Final Clinical Impressions(s) / UC Diagnoses   Final diagnoses:  Dysuria     Discharge Instructions      Your urine pregnancy test is negative  Your urinalysis shows  Labs pending 2-3 days, you will be contacted if positive for any sti and treatment will be sent to the pharmacy, you will have to return to the clinic if positive for gonorrhea to receive treatment   Please refrain from having sex until labs results, if positive please refrain from having sex until treatment complete and symptoms resolve   If positive for HIV, Syphilis, Chlamydia  gonorrhea or trichomoniasis please notify partner or partners so they may tested as well  Moving forward, it is recommended you use some form of protection against  the transmission of sti infections  such as condoms or dental dams with each sexual encounter     ED Prescriptions   None    PDMP not reviewed this encounter.   Valinda Hoar, NP 03/22/21 1026

## 2021-03-22 LAB — RPR: RPR Ser Ql: NONREACTIVE

## 2021-03-22 LAB — CERVICOVAGINAL ANCILLARY ONLY
Bacterial Vaginitis (gardnerella): POSITIVE — AB
Candida Glabrata: NEGATIVE
Candida Vaginitis: POSITIVE — AB
Chlamydia: NEGATIVE
Comment: NEGATIVE
Comment: NEGATIVE
Comment: NEGATIVE
Comment: NEGATIVE
Comment: NEGATIVE
Comment: NORMAL
Neisseria Gonorrhea: NEGATIVE
Trichomonas: NEGATIVE

## 2021-03-22 MED ORDER — FLUCONAZOLE 150 MG PO TABS: 150.0000 mg | ORAL_TABLET | Freq: Once | ORAL | 0 refills | Status: AC

## 2021-03-22 MED ORDER — METRONIDAZOLE 500 MG PO TABS: 500.0000 mg | ORAL_TABLET | Freq: Two times a day (BID) | ORAL | 0 refills | Status: DC

## 2021-05-05 ENCOUNTER — Encounter (HOSPITAL_COMMUNITY): Payer: Self-pay | Admitting: Emergency Medicine

## 2021-05-05 ENCOUNTER — Ambulatory Visit (HOSPITAL_COMMUNITY)
Admission: EM | Admit: 2021-05-05 | Discharge: 2021-05-05 | Disposition: A | Discharge status: Home / Self Care | Payer: Medicaid Other | Attending: Student | Admitting: Student

## 2021-05-05 ENCOUNTER — Other Ambulatory Visit: Payer: Self-pay

## 2021-05-05 DIAGNOSIS — Z789 Other specified health status: Secondary | ICD-10-CM | POA: Insufficient documentation

## 2021-05-05 DIAGNOSIS — Z3202 Encounter for pregnancy test, result negative: Secondary | ICD-10-CM

## 2021-05-05 DIAGNOSIS — N760 Acute vaginitis: Secondary | ICD-10-CM | POA: Insufficient documentation

## 2021-05-05 LAB — POC URINE PREG, ED: Preg Test, Ur: NEGATIVE

## 2021-05-05 MED ORDER — LEVONORGESTREL 1.5 MG PO TABS: 1.5000 mg | ORAL_TABLET | Freq: Once | ORAL | 0 refills | Status: AC

## 2021-05-05 MED ORDER — METRONIDAZOLE 500 MG PO TABS: 500.0000 mg | ORAL_TABLET | Freq: Two times a day (BID) | ORAL | 0 refills | Status: DC

## 2021-05-05 MED ORDER — FLUCONAZOLE 150 MG PO TABS: 150.0000 mg | ORAL_TABLET | Freq: Every day | ORAL | 0 refills | Status: DC

## 2021-05-05 NOTE — Discharge Instructions (Addendum)
-  For bacterial vaginosis, start the antibiotic-Flagyl (metronidazole), 2 pills daily for 7 days.  You can take this with food if you have a sensitive stomach.  Avoid alcohol while taking this medication and for 2 days after as this will cause severe nausea and vomiting. -For your yeast infection, start the Diflucan (fluconazole)- Take one pill today (day 1). If you're still having symptoms in 3 days, take the second pill.  -Take the Plan B pill to prevent unplanned pregnancy -Follow-up if symptoms persist  -Abstain from intercourse until symptoms resolve and treatment complete

## 2021-05-05 NOTE — ED Notes (Signed)
Urine and cervicovaginal swab are in lab

## 2021-05-05 NOTE — ED Triage Notes (Signed)
Patient states: wants a pregnancy test , std testing and script for Plan B

## 2021-05-05 NOTE — ED Provider Notes (Signed)
[facility]    CSN: [id number] Arrival date & time: 05/05/21  0957      History   Chief Complaint Chief Complaint  Patient presents with   SEXUALLY TRANSMITTED DISEASE    HPI Jamie Neal is a 26 y.o. female presenting with STI screen and pregnancy test. Medical history HSV, pregnancy. Few days of vaginal odor and dysuria following protected intercourse. New female partner but has used protection. Denies hematuria, frequency, urgency, back pain, n/v/d/abd pain, fevers/chills, abdnormal vaginal discharge, vaginal rashes/lesions. This is her 8th visit for vaginitis/ STI in 12 months. Symptoms consistent with typical BV and yeast per pt.   HPI  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   Late prenatal care affecting pregnancy, antepartum, third trimester 02/17/2020   Insufficient prenatal care in third trimester 02/17/2020   Supervision of low-risk pregnancy 08/18/2019   Obesity (BMI 30.0-34.9) 08/18/2019    Past Surgical History:  Procedure Laterality Date   CESAREAN SECTION  03/12/2020   Procedure: CESAREAN SECTION;  Surgeon: Griffin Basil, MD;  Location: [facility];  Service: Obstetrics;;   NO PAST SURGERIES      OB History     Gravida  2   Para  2   Term  2   Preterm      AB      Living  2      SAB      IAB      Ectopic      Multiple  0   Live Births  2            Home Medications    Prior to Admission medications   Medication Sig Start Date End Date Taking? Authorizing Provider  fluconazole (DIFLUCAN) 150 MG tablet Take 1 tablet (150 mg total) by mouth daily. -For your yeast infection, start the Diflucan (fluconazole)- Take one pill today (day 1). If you're still having symptoms in 3 days, take the second pill. 05/05/21  Yes Hazel Sams,  PA-C  levonorgestrel (PLAN B ONE-STEP) 1.5 MG tablet Take 1 tablet (1.5 mg total) by mouth once for 1 dose. 05/05/21 05/05/21 Yes Hazel Sams, PA-C  metroNIDAZOLE (FLAGYL) 500 MG tablet Take 1 tablet (500 mg total) by mouth 2 (two) times daily. Avoid alcohol while taking this medication and for 2 days after 05/05/21  Yes Hazel Sams, PA-C    Family History Family History  Problem Relation Age of Onset   Healthy Mother    Diabetes Father    Hypertension Father     Social History Social History   Tobacco Use   Smoking status: Never   Smokeless tobacco: Never  Vaping Use   Vaping Use: Some days  Substance Use Topics   Alcohol use: Yes    Comment: occasionally   Drug use: Never     Allergies   Patient has no known allergies.   Review of Systems Review of Systems  Constitutional:  Negative for chills and fever.  HENT:  Negative for sore throat.   Eyes:  Negative for pain and redness.  Respiratory:  Negative for shortness of breath.   Cardiovascular:  Negative for chest pain.  Gastrointestinal:  Negative for abdominal pain, diarrhea, nausea and vomiting.  Genitourinary:  Positive for dysuria. Negative for decreased  urine volume, difficulty urinating, flank pain, frequency, genital sores, hematuria, urgency, vaginal bleeding, vaginal discharge and vaginal pain.  Musculoskeletal:  Negative for back pain.  Skin:  Negative for rash.  All other systems reviewed and are negative.   Physical Exam Triage Vital Signs ED Triage Vitals  Enc Vitals Group     BP      Pulse      Resp      Temp      Temp src      SpO2      Weight      Height      Head Circumference      Peak Flow      Pain Score      Pain Loc      Pain Edu?      Excl. in [place]?    No data found.  Updated Vital Signs BP 105/70 (BP Location: Left Arm)    Pulse 83    Temp 98.9 F (37.2 C) (Oral)    Resp 18    LMP 04/07/2021 (Approximate)    SpO2 96%   Visual Acuity Right Eye Distance:   Left Eye  Distance:   Bilateral Distance:    Right Eye Near:   Left Eye Near:    Bilateral Near:     Physical Exam Vitals reviewed.  Constitutional:      General: She is not in acute distress.    Appearance: Normal appearance. She is not ill-appearing.  HENT:     Head: Normocephalic and atraumatic.     Mouth/Throat:     Mouth: Mucous membranes are moist.     Comments: Moist mucous membranes Eyes:     Extraocular Movements: Extraocular movements intact.     Pupils: Pupils are equal, round, and reactive to light.  Cardiovascular:     Rate and Rhythm: Normal rate and regular rhythm.     Heart sounds: Normal heart sounds.  Pulmonary:     Effort: Pulmonary effort is normal.     Breath sounds: Normal breath sounds. No wheezing, rhonchi or rales.  Abdominal:     General: Bowel sounds are normal. There is no distension.     Palpations: Abdomen is soft. There is no mass.     Tenderness: There is no abdominal tenderness. There is no right CVA tenderness, left CVA tenderness, guarding or rebound.  Genitourinary:    Comments: deferred Skin:    General: Skin is warm.     Capillary Refill: Capillary refill takes less than 2 seconds.     Comments: Good skin turgor  Neurological:     General: No focal deficit present.     Mental Status: She is alert and oriented to person, place, and time.  Psychiatric:        Mood and Affect: Mood normal.        Behavior: Behavior normal.     UC Treatments / Results  Labs (all labs ordered are listed, but only abnormal results are displayed) Labs Reviewed  POC URINE PREG, ED  CERVICOVAGINAL ANCILLARY ONLY    EKG   Radiology No results found.  Procedures Procedures (including critical care time)  Medications Ordered in UC Medications - No data to display  Initial Impression / Assessment and Plan / UC Course  I have reviewed the triage vital signs and the nursing notes.  Pertinent labs & imaging results that were available during my care of  the patient were reviewed by me and considered in my  medical decision making (see chart for details).     This patient is a very pleasant 26 y.o. year old female presenting with recurrent vaginitis. Afebrile, nontachycardic, no reproducible abd pain or CVAT. New female partner, did use protection (condom). U-preg negative.   Will send self-swab for G/C, trich, yeast, BV testing. Declines HIV, RPR. Safe sex precautions.   Will manage with diflucan, flagyl. Also sent Plan B at patient request.   ED return precautions discussed. Patient verbalizes understanding and agreement.   .   Final Clinical Impressions(s) / UC Diagnoses   Final diagnoses:  Vaginitis and vulvovaginitis  Negative pregnancy test  Emergency contraception     Discharge Instructions      -For bacterial vaginosis, start the antibiotic-Flagyl (metronidazole), 2 pills daily for 7 days.  You can take this with food if you have a sensitive stomach.  Avoid alcohol while taking this medication and for 2 days after as this will cause severe nausea and vomiting. -For your yeast infection, start the Diflucan (fluconazole)- Take one pill today (day 1). If you're still having symptoms in 3 days, take the second pill.  -Take the Plan B pill to prevent unplanned pregnancy -Follow-up if symptoms persist  -Abstain from intercourse until symptoms resolve and treatment complete     ED Prescriptions     Medication Sig Dispense Auth. Provider   levonorgestrel (PLAN B ONE-STEP) 1.5 MG tablet Take 1 tablet (1.5 mg total) by mouth once for 1 dose. 1 tablet Hazel Sams, PA-C   metroNIDAZOLE (FLAGYL) 500 MG tablet Take 1 tablet (500 mg total) by mouth 2 (two) times daily. Avoid alcohol while taking this medication and for 2 days after 14 tablet Hazel Sams, PA-C   fluconazole (DIFLUCAN) 150 MG tablet Take 1 tablet (150 mg total) by mouth daily. -For your yeast infection, start the Diflucan (fluconazole)- Take one pill today (day  1). If you're still having symptoms in 3 days, take the second pill. 2 tablet Hazel Sams, PA-C      PDMP not reviewed this encounter.   Hazel Sams, PA-C 05/05/21 1132

## 2021-05-06 LAB — CERVICOVAGINAL ANCILLARY ONLY
Bacterial Vaginitis (gardnerella): NEGATIVE
Candida Glabrata: NEGATIVE
Candida Vaginitis: POSITIVE — AB
Chlamydia: NEGATIVE
Comment: NEGATIVE
Comment: NEGATIVE
Comment: NEGATIVE
Comment: NEGATIVE
Comment: NEGATIVE
Comment: NORMAL
Neisseria Gonorrhea: NEGATIVE
Trichomonas: NEGATIVE

## 2021-05-21 ENCOUNTER — Ambulatory Visit (HOSPITAL_COMMUNITY)
Admission: EM | Admit: 2021-05-21 | Discharge: 2021-05-21 | Disposition: A | Discharge status: Home / Self Care | Payer: Medicaid Other | Attending: Medical Oncology | Admitting: Medical Oncology

## 2021-05-21 ENCOUNTER — Other Ambulatory Visit: Payer: Self-pay

## 2021-05-21 ENCOUNTER — Encounter (HOSPITAL_COMMUNITY): Payer: Self-pay

## 2021-05-21 DIAGNOSIS — N898 Other specified noninflammatory disorders of vagina: Secondary | ICD-10-CM | POA: Insufficient documentation

## 2021-05-21 MED ORDER — METRONIDAZOLE 500 MG PO TABS: 500.0000 mg | ORAL_TABLET | Freq: Two times a day (BID) | ORAL | 0 refills | Status: DC

## 2021-05-21 NOTE — ED Triage Notes (Signed)
Pt presents for STD Testing after having a thick discharge with an odor X 1 week; pt also requesting pregnancy testing.

## 2021-05-21 NOTE — ED Provider Notes (Signed)
Jamie Neal    CSN: [id number] Arrival date & time: 05/21/21  1011      History   Chief Complaint Chief Complaint  Patient presents with   Vaginal Discharge   Possible Pregnancy    HPI Jamie Neal is a 26 y.o. female.   HPI  Vaginal Discharge: Patient reports that for the past 7 days she has had vaginal discharge. Discharge described as thick white and with an odor. Originally had a yeast infection and treated this with AZO and diflucan which helped cure the discharge but the odor remains. She thinks she may have BV. She denies risk of foreign body including tampon. She denies dysuria, fever, pelvic pain. LMP: 2 weeks ago and normal on time. Originally requested urine hcg.   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   Late prenatal care affecting pregnancy, antepartum, third trimester 02/17/2020   Insufficient prenatal care in third trimester 02/17/2020   Supervision of low-risk pregnancy 08/18/2019   Obesity (BMI 30.0-34.9) 08/18/2019    Past Surgical History:  Procedure Laterality Date   CESAREAN SECTION  03/12/2020   Procedure: CESAREAN SECTION;  Surgeon: Griffin Basil, MD;  Location: [facility];  Service: Obstetrics;;   NO PAST SURGERIES      OB History     Gravida  2   Para  2   Term  2   Preterm      AB      Living  2      SAB      IAB      Ectopic      Multiple  0   Live Births  2            Home Medications    Prior to Admission medications   Medication Sig Start Date End Date Taking? Authorizing Provider  fluconazole (DIFLUCAN) 150 MG tablet Take 1 tablet (150 mg total) by mouth daily. -For your yeast infection, start the Diflucan (fluconazole)- Take one pill today (day 1). If you're still having symptoms in 3 days, take the  second pill. 05/05/21   Hazel Sams, PA-C  metroNIDAZOLE (FLAGYL) 500 MG tablet Take 1 tablet (500 mg total) by mouth 2 (two) times daily. Avoid alcohol while taking this medication and for 2 days after 05/05/21   Hazel Sams, PA-C    Family History Family History  Problem Relation Age of Onset   Healthy Mother    Diabetes Father    Hypertension Father     Social History Social History   Tobacco Use   Smoking status: Never   Smokeless tobacco: Never  Vaping Use   Vaping Use: Some days  Substance Use Topics   Alcohol use: Yes    Comment: occasionally   Drug use: Never     Allergies   Patient has no known allergies.   Review of Systems Review of Systems  As stated above in HPI Physical Exam Triage Vital Signs ED Triage Vitals  Enc Vitals Group     BP 05/21/21 1046 104/73     Pulse Rate 05/21/21 1046 78     Resp 05/21/21 1046 18     Temp 05/21/21 1046 98.4 F (36.9 C)     Temp Source 05/21/21 1046 Oral  SpO2 05/21/21 1046 98 %     Weight --      Height --      Head Circumference --      Peak Flow --      Pain Score 05/21/21 1045 0     Pain Loc --      Pain Edu? --      Excl. in [place]? --    No data found.  Updated Vital Signs BP 104/73 (BP Location: Left Arm)    Pulse 78    Temp 98.4 F (36.9 C) (Oral)    Resp 18    LMP  (LMP Unknown)    SpO2 98%    Breastfeeding No   Physical Exam Vitals and nursing note reviewed.  Constitutional:      General: She is not in acute distress.    Appearance: Normal appearance. She is not ill-appearing, toxic-appearing or diaphoretic.  Cardiovascular:     Rate and Rhythm: Normal rate and regular rhythm.     Heart sounds: Normal heart sounds.  Pulmonary:     Effort: Pulmonary effort is normal.     Breath sounds: Normal breath sounds.  Abdominal:     General: Bowel sounds are normal. There is no distension.     Palpations: Abdomen is soft. There is no mass.     Tenderness: There is no abdominal tenderness. There  is no right CVA tenderness, left CVA tenderness, guarding or rebound.     Hernia: No hernia is present.  Skin:    General: Skin is warm.  Neurological:     Mental Status: She is alert.     UC Treatments / Results  Labs (all labs ordered are listed, but only abnormal results are displayed) Labs Reviewed - No data to display  EKG   Radiology No results found.  Procedures Procedures (including critical care time)  Medications Ordered in UC Medications - No data to display  Initial Impression / Assessment and Plan / UC Course  I have reviewed the triage vital signs and the nursing notes.  Pertinent labs & imaging results that were available during my care of the patient were reviewed by me and considered in my medical decision making (see chart for details).     New.  GCC pending.  Given her history and stating that she has had bacterial vaginosis before and symptoms feel similar along with recent Diflucan use which can increase the risk of BV I have elected to go ahead and treat her presumptively while we wait for her vaginal testing to return.  We discussed that the urine pregnancy test would pick up a pregnancy after missed.  Our 30-day mark.  As her.  It was 1 to 2 weeks ago this would not likely be beneficial.  She will return should she miss her period.    Final Clinical Impressions(s) / UC Diagnoses   Final diagnoses:  None   Discharge Instructions   None    ED Prescriptions   None    PDMP not reviewed this encounter.   Hughie Closs, [place] 05/21/21 1109

## 2021-05-21 NOTE — Discharge Instructions (Signed)
Flagyl to be sent to the pharmacy for you to start.

## 2021-05-23 LAB — CERVICOVAGINAL ANCILLARY ONLY
Bacterial Vaginitis (gardnerella): POSITIVE — AB
Candida Glabrata: NEGATIVE
Candida Vaginitis: NEGATIVE
Chlamydia: NEGATIVE
Comment: NEGATIVE
Comment: NEGATIVE
Comment: NEGATIVE
Comment: NEGATIVE
Comment: NEGATIVE
Comment: NORMAL
Neisseria Gonorrhea: NEGATIVE
Trichomonas: NEGATIVE

## 2021-07-12 ENCOUNTER — Encounter (HOSPITAL_COMMUNITY): Payer: Self-pay | Admitting: *Deleted

## 2021-07-12 ENCOUNTER — Other Ambulatory Visit: Payer: Self-pay

## 2021-07-12 ENCOUNTER — Ambulatory Visit (HOSPITAL_COMMUNITY)
Admission: EM | Admit: 2021-07-12 | Discharge: 2021-07-12 | Disposition: A | Discharge status: Home / Self Care | Payer: Medicaid Other | Attending: Family Medicine | Admitting: Family Medicine

## 2021-07-12 DIAGNOSIS — Z113 Encounter for screening for infections with a predominantly sexual mode of transmission: Secondary | ICD-10-CM | POA: Insufficient documentation

## 2021-07-12 DIAGNOSIS — Z3202 Encounter for pregnancy test, result negative: Secondary | ICD-10-CM | POA: Insufficient documentation

## 2021-07-12 LAB — POC URINE PREG, ED
Preg Test, Ur: NEGATIVE
Preg Test, Ur: NEGATIVE

## 2021-07-12 NOTE — ED Provider Notes (Signed)
?[facility] ? ? ? ?CSN: [id number] ?Arrival date & time: 07/12/21  1218 ? ? ?  ? ?History   ?Chief Complaint ?Chief Complaint  ?Patient presents with  ? SEXUALLY TRANSMITTED DISEASE  ? Possible Pregnancy  ? ? ?HPI ?Jamie Neal is a 26 y.o. female.  ? ?Presents with concerns for pregnancy, last menstrual period 06/08/2021.  Sexually active, 1 partner, sometimes condom use.  Requesting routine STD testing.  Denies all symptoms.  No known exposure.   ? ? ? ?Past Medical History:  ?Diagnosis Date  ? Medical history non-contributory   ? ? ?Patient Active Problem List  ? Diagnosis Date Noted  ? Encounter for induction of labor 03/12/2020  ? Pregnancy 03/12/2020  ? Herpes infection 02/25/2020  ? Late prenatal care affecting pregnancy, antepartum, third trimester 02/17/2020  ? Insufficient prenatal care in third trimester 02/17/2020  ? Supervision of low-risk pregnancy 08/18/2019  ? Obesity (BMI 30.0-34.9) 08/18/2019  ? ? ?Past Surgical History:  ?Procedure Laterality Date  ? CESAREAN SECTION  03/12/2020  ? Procedure: CESAREAN SECTION;  Surgeon: Warden Fillers, MD;  Location: [facility];  Service: Obstetrics;;  ? NO PAST SURGERIES    ? ? ?OB History   ? ? Gravida  ?2  ? Para  ?2  ? Term  ?2  ? Preterm  ?   ? AB  ?   ? Living  ?2  ?  ? ? SAB  ?   ? IAB  ?   ? Ectopic  ?   ? Multiple  ?0  ? Live Births  ?2  ?   ?  ?  ? ? ? ?Home Medications   ? ?Prior to Admission medications   ?Medication Sig Start Date End Date Taking? Authorizing Provider  ?fluconazole (DIFLUCAN) 150 MG tablet Take 1 tablet (150 mg total) by mouth daily. -For your yeast infection, start the Diflucan (fluconazole)- Take one pill today (day 1). If you're still having symptoms in 3 days, take the second pill. 05/05/21   Rhys Martini, PA-C  ?metroNIDAZOLE (FLAGYL) 500 MG tablet Take 1 tablet (500 mg total) by mouth 2 (two) times daily. Avoid alcohol while taking this medication and for 2 days after 05/21/21   Rushie Chestnut, PA-C  ? ? ?Family  History ?Family History  ?Problem Relation Age of Onset  ? Healthy Mother   ? Diabetes Father   ? Hypertension Father   ? ? ?Social History ?Social History  ? ?Tobacco Use  ? Smoking status: Never  ? Smokeless tobacco: Never  ?Vaping Use  ? Vaping Use: Some days  ?Substance Use Topics  ? Alcohol use: Yes  ?  Comment: occasionally  ? Drug use: Never  ? ? ? ?Allergies   ?Patient has no known allergies. ? ? ?Review of Systems ?Review of Systems  ?Constitutional: Negative.   ?Respiratory: Negative.    ?Genitourinary:  Positive for menstrual problem. Negative for decreased urine volume, difficulty urinating, dyspareunia, dysuria, enuresis, flank pain, frequency, genital sores, hematuria, pelvic pain, urgency, vaginal bleeding, vaginal discharge and vaginal pain.  ?Skin: Negative.   ?Neurological: Negative.   ? ? ?Physical Exam ?Triage Vital Signs ?ED Triage Vitals  ?Enc Vitals Group  ?   BP 07/12/21 1317 107/72  ?   Pulse Rate 07/12/21 1317 76  ?   Resp 07/12/21 1317 16  ?   Temp 07/12/21 1317 98.2 ?F (36.8 ?C)  ?   Temp src --   ?  SpO2 07/12/21 1317 96 %  ?   Weight --   ?   Height --   ?   Head Circumference --   ?   Peak Flow --   ?   Pain Score 07/12/21 1315 0  ?   Pain Loc --   ?   Pain Edu? --   ?   Excl. in GC? --   ? ?No data found. ? ?Updated Vital Signs ?BP 107/72   Pulse 76   Temp 98.2 ?F (36.8 ?C)   Resp 16   LMP 06/08/2021   SpO2 96%  ? ?Visual Acuity ?Right Eye Distance:   ?Left Eye Distance:   ?Bilateral Distance:   ? ?Right Eye Near:   ?Left Eye Near:    ?Bilateral Near:    ? ?Physical Exam ?Constitutional:   ?   Appearance: Normal appearance.  ?Eyes:  ?   Extraocular Movements: Extraocular movements intact.  ?Pulmonary:  ?   Effort: Pulmonary effort is normal.  ?Genitourinary: ?   Comments: Deferred ?Neurological:  ?   Mental Status: She is alert and oriented to person, place, and time. Mental status is at baseline.  ?Psychiatric:     ?   Mood and Affect: Mood normal.     ?   Behavior: Behavior  normal.  ? ? ? ?UC Treatments / Results  ?Labs ?(all labs ordered are listed, but only abnormal results are displayed) ?Labs Reviewed  ?POC URINE PREG, ED  ?POC URINE PREG, ED  ?CERVICOVAGINAL ANCILLARY ONLY  ? ? ?EKG ? ? ?Radiology ?No results found. ? ?Procedures ?Procedures (including critical care time) ? ?Medications Ordered in UC ?Medications - No data to display ? ?Initial Impression / Assessment and Plan / UC Course  ?I have reviewed the triage vital signs and the nursing notes. ? ?Pertinent labs & imaging results that were available during my care of the patient were reviewed by me and considered in my medical decision making (see chart for details). ? ?Negative pregnancy test ?Routine screening for STD ? ?Urine pregnancy negative, advised patient to retest in 2 weeks if menses has not started, etiology is most likely hormonal imbalance, patient to watchfully wait, may return to urgent care as needed or follow-up with gynecology if irregular menses persist, STI labs are pending, advised abstinence until lab results, if positive advised abstinence until all treatment is complete, advised condom use during sexual encounters moving forward, urgent care follow-up as needed ?Final Clinical Impressions(s) / UC Diagnoses  ? ?Final diagnoses:  ?None  ? ? ? ?Discharge Instructions   ? ?  ?Labs pending 2-3 days, you will be contacted if positive for any sti and treatment will be sent to the pharmacy, you will have to return to the clinic if positive for gonorrhea to receive treatment  ? ?Please refrain from having sex until labs results, if positive please refrain from having sex until treatment complete and symptoms resolve  ? ?If positive for , Chlamydia  gonorrhea or trichomoniasis please notify partner or partners so they may tested as well ? ?Moving forward, it is recommended you use some form of protection against the transmission of sti infections  such as condoms or dental dams with each sexual encounter    ? ? ?ED Prescriptions   ?None ?  ? ?PDMP not reviewed this encounter. ?  ?Valinda Hoar, NP ?07/12/21 1349 ? ?

## 2021-07-12 NOTE — Discharge Instructions (Addendum)
Urine pregnancy test was negative, if menstruation has not started in 2 weeks please take another test, pregnancy test from the [employer] store are very accurate, your missed period could be due to hormonal imbalance typically the body will regulate itself back to your normal schedule ? ? ? ?Labs pending 2-3 days, you will be contacted if positive for any sti and treatment will be sent to the pharmacy, you will have to return to the clinic if positive for gonorrhea to receive treatment  ? ?Please refrain from having sex until labs results, if positive please refrain from having sex until treatment complete and symptoms resolve  ? ?If positive for , Chlamydia  gonorrhea or trichomoniasis please notify partner or partners so they may tested as well ? ?Moving forward, it is recommended you use some form of protection against the transmission of sti infections  such as condoms or dental dams with each sexual encounter   ?

## 2021-07-12 NOTE — ED Triage Notes (Signed)
Pt requesting preg test and std testing no Sx's. ?

## 2021-07-13 LAB — CERVICOVAGINAL ANCILLARY ONLY
Bacterial Vaginitis (gardnerella): NEGATIVE
Candida Glabrata: NEGATIVE
Candida Vaginitis: NEGATIVE
Chlamydia: NEGATIVE
Comment: NEGATIVE
Comment: NEGATIVE
Comment: NEGATIVE
Comment: NEGATIVE
Comment: NEGATIVE
Comment: NORMAL
Neisseria Gonorrhea: NEGATIVE
Trichomonas: NEGATIVE

## 2021-09-04 ENCOUNTER — Ambulatory Visit (HOSPITAL_COMMUNITY)
Admission: EM | Admit: 2021-09-04 | Discharge: 2021-09-04 | Disposition: A | Discharge status: Home / Self Care | Payer: Medicaid Other

## 2021-09-04 ENCOUNTER — Encounter (HOSPITAL_COMMUNITY): Payer: Self-pay

## 2021-09-04 DIAGNOSIS — T162XXA Foreign body in left ear, initial encounter: Secondary | ICD-10-CM

## 2021-09-04 NOTE — ED Triage Notes (Signed)
Pt reports Q-tip is stuck in her left ear after trying to clean it today. No meds taken. No decrease in hearing ability. ?

## 2021-09-04 NOTE — ED Provider Notes (Signed)
?[facility] ? ? ? ?CSN: [id number] ?Arrival date & time: 09/04/21  1643 ? ? ?  ? ?History   ?Chief Complaint ?Chief Complaint  ?Patient presents with  ? Foreign Body in Ear  ?  L  ? ? ?HPI ?Jamie Neal is a 26 y.o. female.  ? ?Patient presents for left ear pain after getting a Q-tip stuck in her left ear.  She denies fevers, decreased hearing out of the ear.  No drainage from the ear.  No congestion or runny nose.  She reports this happened earlier today. ? ?She is on some googling and now knows that she should not use Q-tips in her ears. ? ? ?Past Medical History:  ?Diagnosis Date  ? Medical history non-contributory   ? ? ?Patient Active Problem List  ? Diagnosis Date Noted  ? Encounter for induction of labor 03/12/2020  ? Pregnancy 03/12/2020  ? Herpes infection 02/25/2020  ? Late prenatal care affecting pregnancy, antepartum, third trimester 02/17/2020  ? Insufficient prenatal care in third trimester 02/17/2020  ? Supervision of low-risk pregnancy 08/18/2019  ? Obesity (BMI 30.0-34.9) 08/18/2019  ? ? ?Past Surgical History:  ?Procedure Laterality Date  ? CESAREAN SECTION  03/12/2020  ? Procedure: CESAREAN SECTION;  Surgeon: Warden Fillers, MD;  Location: [facility];  Service: Obstetrics;;  ? NO PAST SURGERIES    ? ? ?OB History   ? ? Gravida  ?2  ? Para  ?2  ? Term  ?2  ? Preterm  ?   ? AB  ?   ? Living  ?2  ?  ? ? SAB  ?   ? IAB  ?   ? Ectopic  ?   ? Multiple  ?0  ? Live Births  ?2  ?   ?  ?  ? ? ? ?Home Medications   ? ?Prior to Admission medications   ?Medication Sig Start Date End Date Taking? Authorizing Provider  ?fluconazole (DIFLUCAN) 150 MG tablet Take 1 tablet (150 mg total) by mouth daily. -For your yeast infection, start the Diflucan (fluconazole)- Take one pill today (day 1). If you're still having symptoms in 3 days, take the second pill. 05/05/21   Rhys Martini, PA-C  ?metroNIDAZOLE (FLAGYL) 500 MG tablet Take 1 tablet (500 mg total) by mouth 2 (two) times daily. Avoid alcohol while  taking this medication and for 2 days after 05/21/21   Rushie Chestnut, PA-C  ? ? ?Family History ?Family History  ?Problem Relation Age of Onset  ? Healthy Mother   ? Diabetes Father   ? Hypertension Father   ? ? ?Social History ?Social History  ? ?Tobacco Use  ? Smoking status: Never  ? Smokeless tobacco: Never  ?Vaping Use  ? Vaping Use: Some days  ?Substance Use Topics  ? Alcohol use: Yes  ?  Comment: occasionally  ? Drug use: Never  ? ? ? ?Allergies   ?Patient has no known allergies. ? ? ?Review of Systems ?Review of Systems ?Per HPI ? ?Physical Exam ?Triage Vital Signs ?ED Triage Vitals [09/04/21 1726]  ?Enc Vitals Group  ?   BP (!) 101/48  ?   Pulse Rate 84  ?   Resp 17  ?   Temp 97.9 ?F (36.6 ?C)  ?   Temp Source Oral  ?   SpO2 100 %  ?   Weight   ?   Height   ?   Head Circumference   ?  Peak Flow   ?   Pain Score 0  ?   Pain Loc   ?   Pain Edu?   ?   Excl. in GC?   ? ?No data found. ? ?Updated Vital Signs ?BP (!) 101/48 (BP Location: Left Arm) Comment: rechecked twice  Pulse 84   Temp 97.9 ?F (36.6 ?C) (Oral)   Resp 17   SpO2 100%   Breastfeeding No  ? ?Visual Acuity ?Right Eye Distance:   ?Left Eye Distance:   ?Bilateral Distance:   ? ?Right Eye Near:   ?Left Eye Near:    ?Bilateral Near:    ? ?Physical Exam ?Vitals and nursing note reviewed.  ?Constitutional:   ?   General: She is not in acute distress. ?   Appearance: Normal appearance. She is not toxic-appearing.  ?HENT:  ?   Head: Normocephalic and atraumatic.  ?   Right Ear: Tympanic membrane, ear canal and external ear normal. There is no impacted cerumen.  ?   Left Ear: Ear canal and external ear normal.  ?   Ears:  ?   Comments: Large white object in external auditory canal obstructing tympanic membrane ?   Nose: Nose normal. No congestion.  ?   Mouth/Throat:  ?   Mouth: Mucous membranes are moist.  ?Skin: ?   General: Skin is warm and dry.  ?   Capillary Refill: Capillary refill takes less than 2 seconds.  ?   Coloration: Skin is not  jaundiced or pale.  ?   Findings: No erythema.  ?Neurological:  ?   Mental Status: She is alert and oriented to person, place, and time.  ?Psychiatric:     ?   Behavior: Behavior is cooperative.  ? ? ? ?UC Treatments / Results  ?Labs ?(all labs ordered are listed, but only abnormal results are displayed) ?Labs Reviewed - No data to display ? ?EKG ? ? ?Radiology ?No results found. ? ?Procedures ?Procedures (including critical care time) ? ?Medications Ordered in UC ?Medications - No data to display ? ?Initial Impression / Assessment and Plan / UC Course  ?I have reviewed the triage vital signs and the nursing notes. ? ?Pertinent labs & imaging results that were available during my care of the patient were reviewed by me and considered in my medical decision making (see chart for details). ? ?  ?Alligator forceps used to remove what looked like cotton Q-tip and from left ear.  Patient tolerated well.  TM intact and pearly grey after removal.  Discouraged use of inserting anything into ears.   ?Final Clinical Impressions(s) / UC Diagnoses  ? ?Final diagnoses:  ?Foreign body of left ear, initial encounter  ? ?Discharge Instructions   ?None ?  ? ?ED Prescriptions   ?None ?  ? ?PDMP not reviewed this encounter. ?  ?Valentino Nose, NP ?09/04/21 1746 ? ?

## 2021-10-14 IMAGING — US US FETAL BPP W/ NON-STRESS
1 series · 10 of 10 positions shown · non-contrast
Comparison: none

[Series 1: us fetal bpp w/ non-stress · 10 acquisitions, 10 frames shown]
[im 1/10]
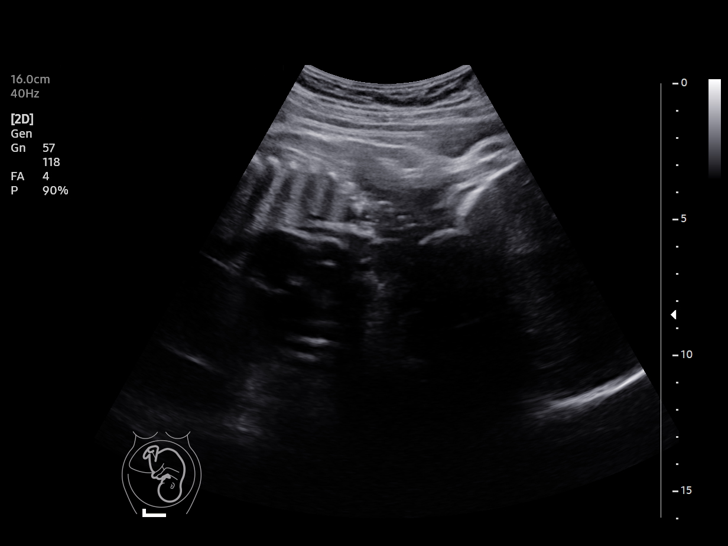
[im 2/10]
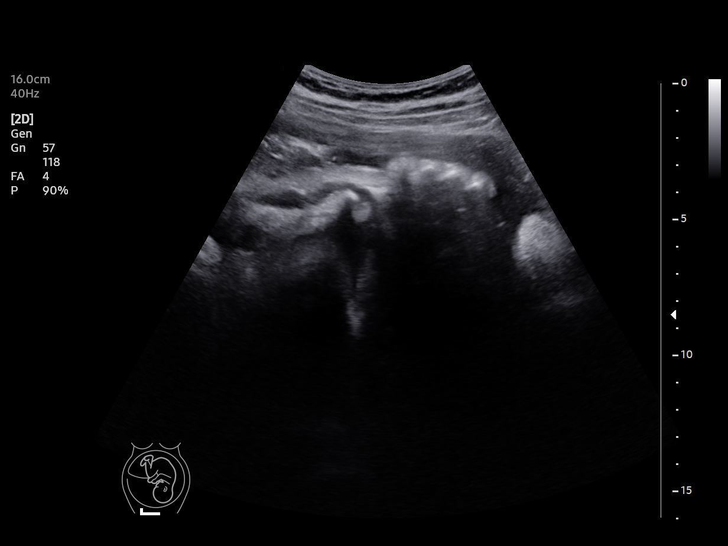
[im 3/10]
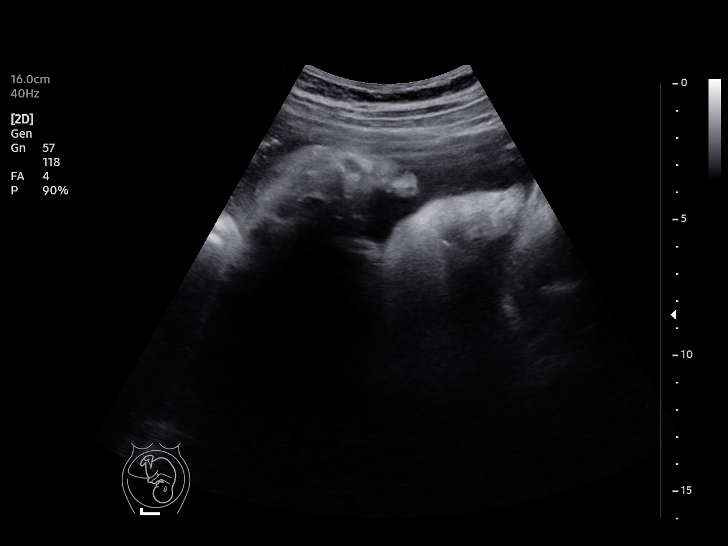
[im 4/10]
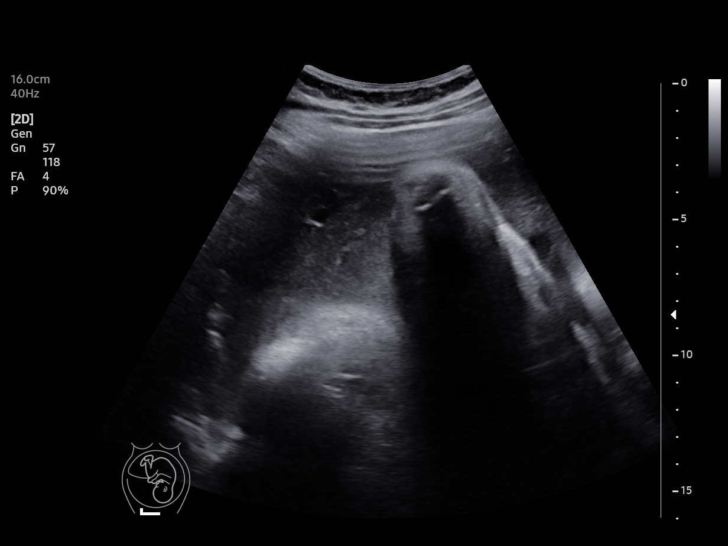
[im 5/10]
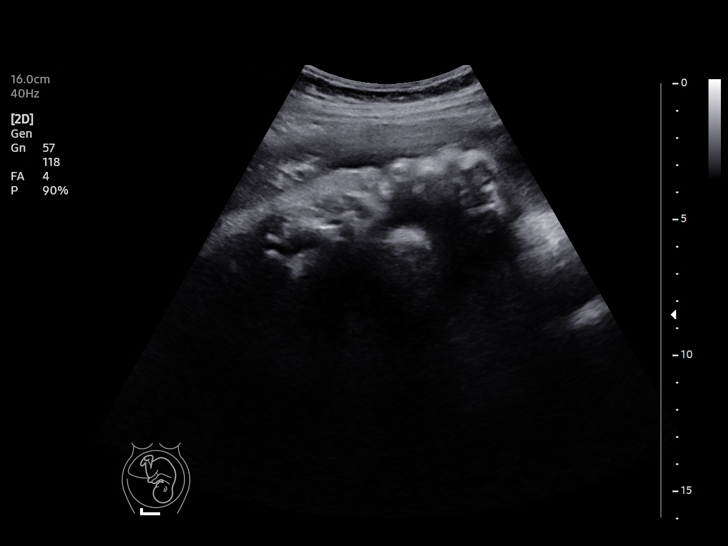
[im 6/10]
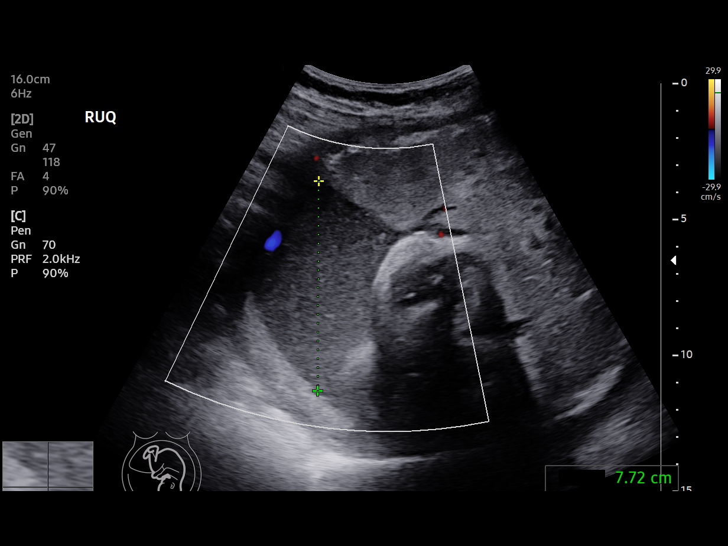
[im 7/10]
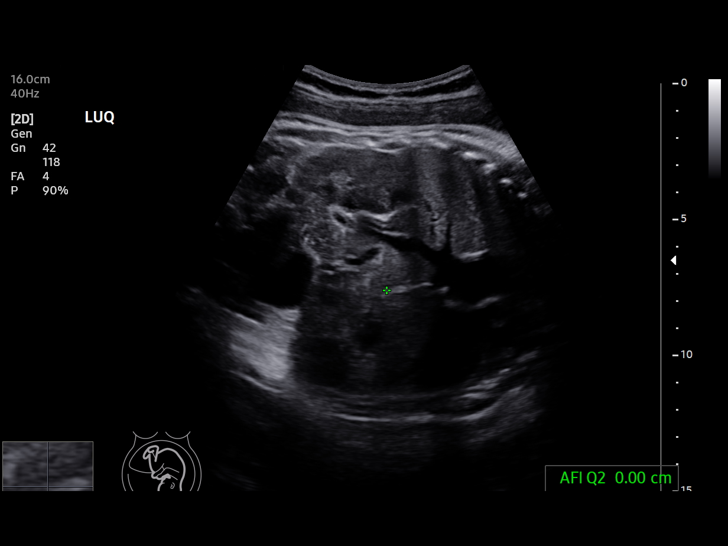
[im 8/10]
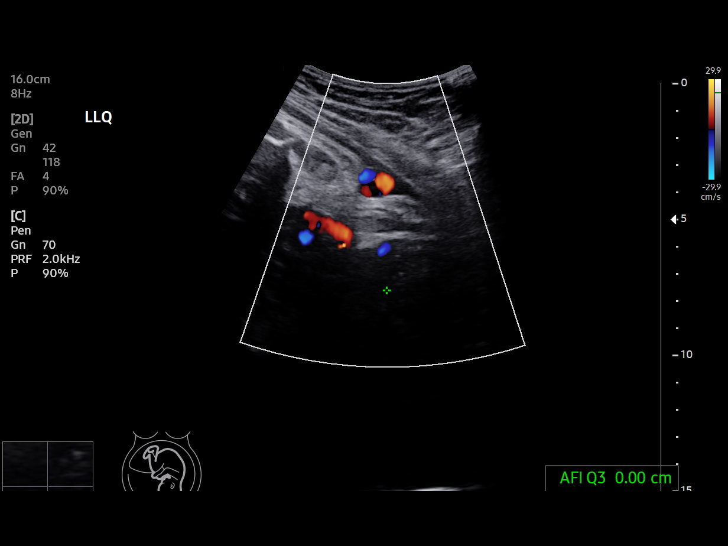
[im 9/10]
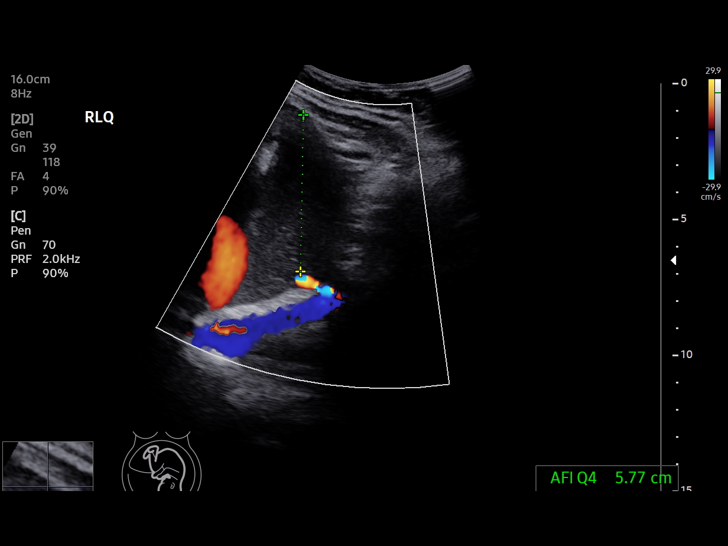
[im 10/10]
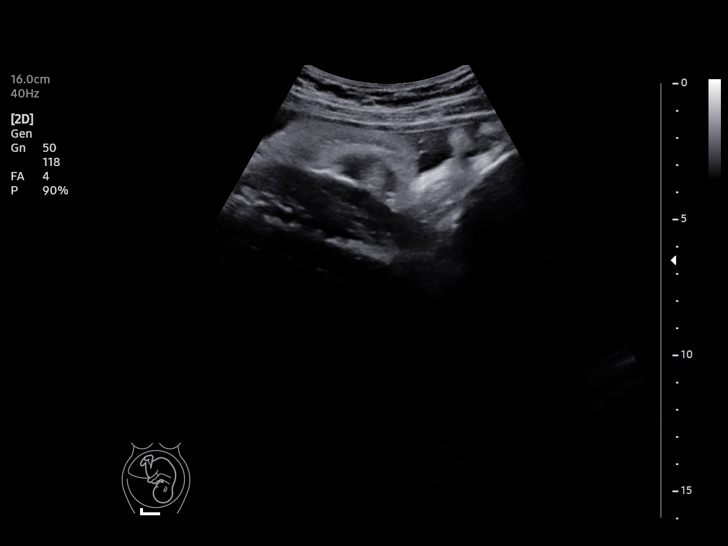

[10 of 10 positions shown; findings below may reference images not displayed]

[facility] [HOSPITAL]
                                                             [REDACTED]care at
                   [facility]

Service(s) Provided

Indications

 40 weeks gestation of pregnancy
 Postdate pregnancy (40-42 weeks)
Vital Signs

                                                Height:        5'0"
Fetal Evaluation

 Num Of Fetuses:          1
 Preg. Location:          Intrauterine
 Cardiac Activity:        Observed
 Presentation:            Cephalic

 Amniotic Fluid
 AFI FV:      Within normal limits

 AFI Sum(cm)     %Tile       Largest Pocket(cm)
 13.49           58

 RUQ(cm)       RLQ(cm)       LUQ(cm)        LLQ(cm)
 7.72          5.77          0              0

 Comment:    [DATE] BPP, NST noted as reactive.
Biophysical Evaluation

 Amniotic F.V:   Pocket => 2 cm             F. Tone:         Observed
 F. Movement:    Observed                   N.S.T:           Reactive
 F. Breathing:   Not Observed               Score:           [DATE]
OB History

 Gravidity:    2         Term:   1
 Living:       1
Gestational Age

 Best:          40w 3d     Det. By:  Early Ultrasound         EDD:    03/07/20
                                     (08/16/19)

## 2021-10-16 IMAGING — DX DG OR LOCAL ABDOMEN
1 series · 1 of 1 positions shown · non-contrast
Comparison: Portable exam 5159 hours without priors for comparison

CLINICAL DATA: Emergency Caesarean section, no needle count

EXAM:
OR LOCAL ABDOMEN

[abdomen]
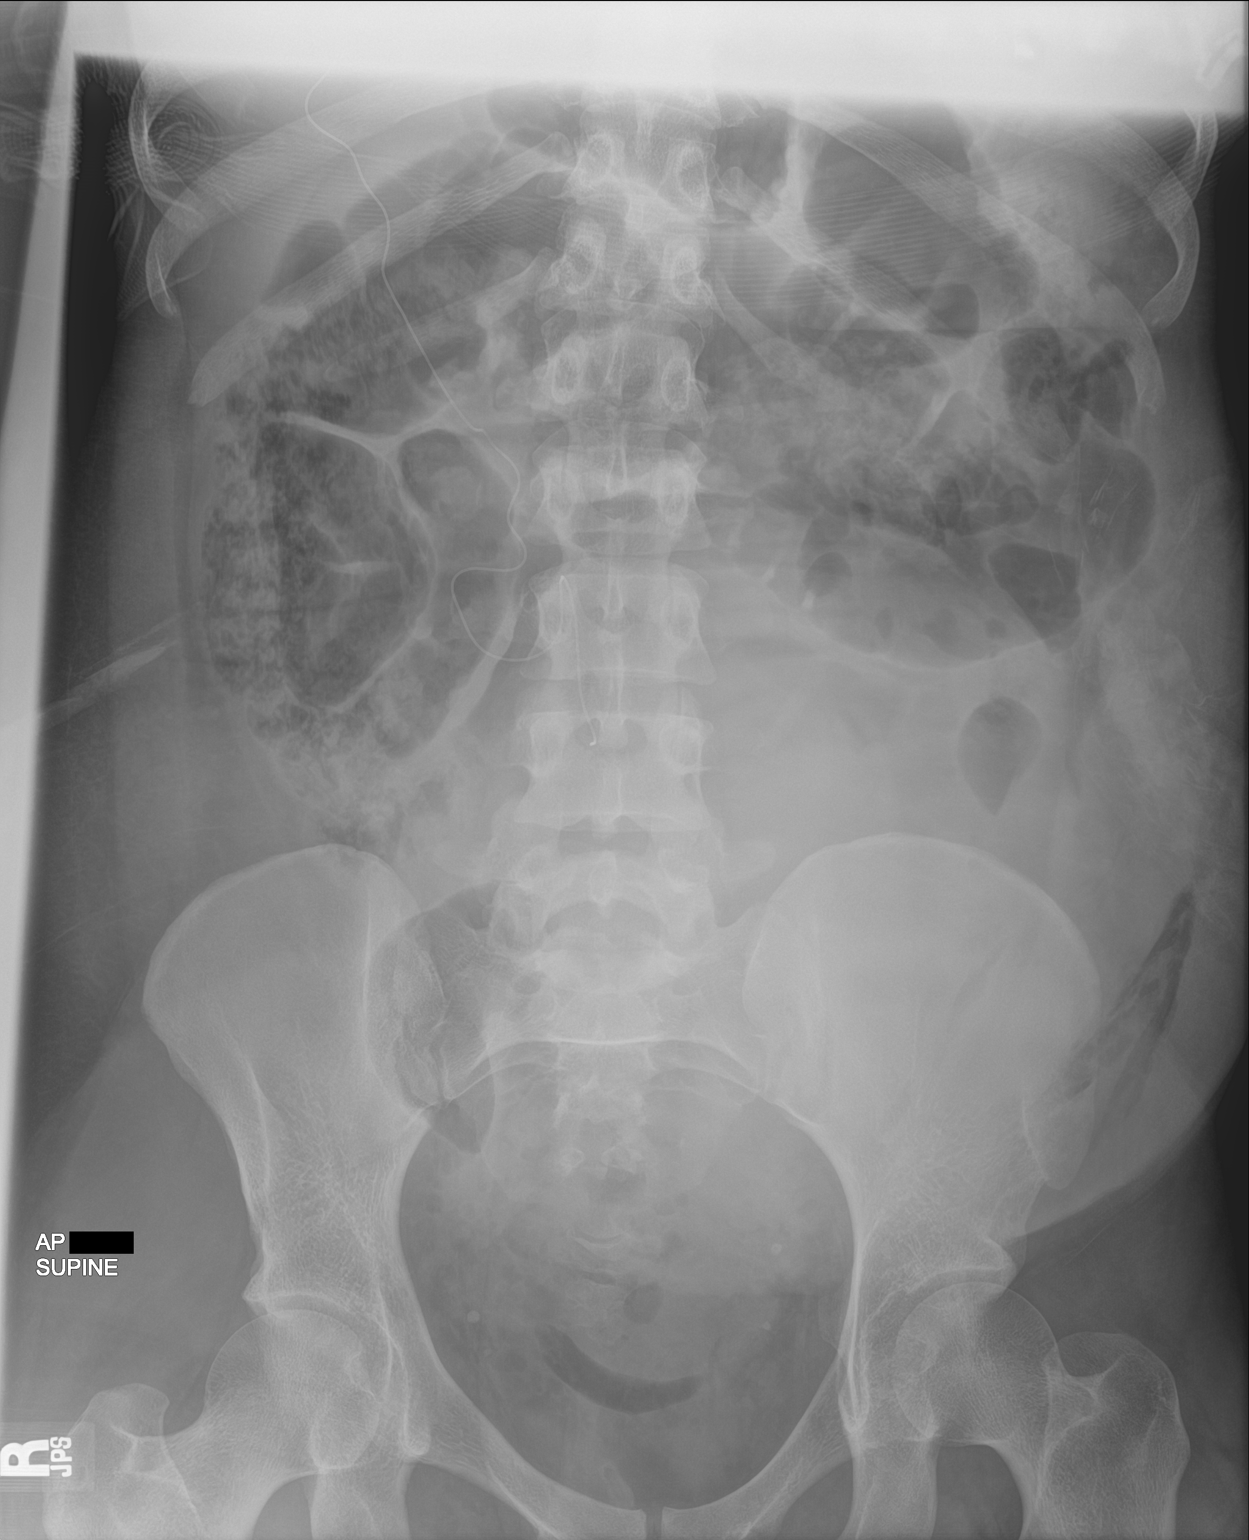

[1 of 1 positions shown; findings below may reference images not displayed]

FINDINGS: Spinal catheter noted.

Short linear radiopaque foreign body identified projecting over the
LEFT SI joint, 3 mm length, question surgical clip.

No other metallic foreign bodies otherwise identified.

Bowel gas pattern normal.

Osseous structures unremarkable.

Small BILATERAL pelvic phleboliths.
IMPRESSION: Questionable 3 mm radiopaque foreign body projecting over LEFT SI
joint, potentially a surgical clip; recommend correlation with
surgery.

Spinal catheter.

No additional radiopaque foreign bodies identified.

Findings called to Anesthesia on 03/12/2020 at 9019 hours.

## 2021-12-25 ENCOUNTER — Encounter (HOSPITAL_COMMUNITY): Payer: Self-pay | Admitting: Emergency Medicine

## 2021-12-25 ENCOUNTER — Ambulatory Visit (HOSPITAL_COMMUNITY)
Admission: EM | Admit: 2021-12-25 | Discharge: 2021-12-25 | Disposition: A | Discharge status: Home / Self Care | Payer: Medicaid Other | Attending: Physician Assistant | Admitting: Physician Assistant

## 2021-12-25 DIAGNOSIS — Z113 Encounter for screening for infections with a predominantly sexual mode of transmission: Secondary | ICD-10-CM | POA: Insufficient documentation

## 2021-12-25 DIAGNOSIS — Z3202 Encounter for pregnancy test, result negative: Secondary | ICD-10-CM | POA: Insufficient documentation

## 2021-12-25 LAB — POC URINE PREG, ED: Preg Test, Ur: NEGATIVE

## 2021-12-25 NOTE — Discharge Instructions (Signed)
Your pregnancy test was negative.  We will contact you if we need to arrange any treatment based on your STI results.  Monitor your MyChart for these results.  Please abstain from sex until you receive your results.  You should use a condom with each sexual encounter.  If you develop any symptoms please return for reevaluation.

## 2021-12-25 NOTE — ED Provider Notes (Signed)
[facility]    CSN: [id number] Arrival date & time: 12/25/21  1002      History   Chief Complaint Chief Complaint  Patient presents with   SEXUALLY TRANSMITTED DISEASE    HPI Jamie Neal is a 26 y.o. female.   Patient presents today for STI screening.  Reports that she is between partners and would like to ensure that she does not have any STIs before beginning any relationship.  She denies any current symptoms including abdominal pain, pelvic pain, fever, nausea, vomiting.  She is also requesting a pregnancy test today.  Reports LMP of 11/22/2021.  She is not currently on any birth control.  She denies any specific exposure.  She is active with female partners and does try to use condoms consistently.  Denies any recent antibiotic use.      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   Late prenatal care affecting pregnancy, antepartum, third trimester 02/17/2020   Insufficient prenatal care in third trimester 02/17/2020   Supervision of low-risk pregnancy 08/18/2019   Obesity (BMI 30.0-34.9) 08/18/2019    Past Surgical History:  Procedure Laterality Date   CESAREAN SECTION  03/12/2020   Procedure: CESAREAN SECTION;  Surgeon: Warden Fillers, MD;  Location: [facility];  Service: Obstetrics;;   NO PAST SURGERIES      OB History     Gravida  2   Para  2   Term  2   Preterm      AB      Living  2      SAB      IAB      Ectopic      Multiple  0   Live Births  2            Home Medications    Prior to Admission medications   Medication Sig Start Date End Date Taking? Authorizing Provider  fluconazole (DIFLUCAN) 150 MG tablet Take 1 tablet (150 mg total) by mouth daily. -For your yeast infection, start the Diflucan (fluconazole)- Take one pill today  (day 1). If you're still having symptoms in 3 days, take the second pill. 05/05/21   Rhys Martini, PA-C  metroNIDAZOLE (FLAGYL) 500 MG tablet Take 1 tablet (500 mg total) by mouth 2 (two) times daily. Avoid alcohol while taking this medication and for 2 days after 05/21/21   Rushie Chestnut, PA-C    Family History Family History  Problem Relation Age of Onset   Healthy Mother    Diabetes Father    Hypertension Father     Social History Social History   Tobacco Use   Smoking status: Never   Smokeless tobacco: Never  Vaping Use   Vaping Use: Some days  Substance Use Topics   Alcohol use: Yes    Comment: occasionally   Drug use: Never     Allergies   Patient has no known allergies.   Review of Systems Review of Systems  Constitutional:  Negative for activity change, appetite change, fatigue and fever.  Gastrointestinal:  Negative for abdominal pain, diarrhea, nausea and vomiting.  Genitourinary:  Negative for dysuria, frequency, pelvic pain, urgency, vaginal bleeding, vaginal discharge and vaginal pain.     Physical Exam Triage Vital Signs ED Triage Vitals  Enc  Vitals Group     BP 12/25/21 1017 (!) 93/49     Pulse Rate 12/25/21 1017 79     Resp 12/25/21 1017 16     Temp 12/25/21 1017 98.8 F (37.1 C)     Temp Source 12/25/21 1017 Oral     SpO2 12/25/21 1017 100 %     Weight --      Height --      Head Circumference --      Peak Flow --      Pain Score 12/25/21 1018 0     Pain Loc --      Pain Edu? --      Excl. in GC? --    No data found.  Updated Vital Signs BP (!) 93/49 (BP Location: Right Arm)   Pulse 79   Temp 98.8 F (37.1 C) (Oral)   Resp 16   LMP 11/22/2021   SpO2 100%   Visual Acuity Right Eye Distance:   Left Eye Distance:   Bilateral Distance:    Right Eye Near:   Left Eye Near:    Bilateral Near:     Physical Exam Vitals reviewed.  Constitutional:      General: She is awake. She is not in acute distress.    Appearance:  Normal appearance. She is well-developed. She is not ill-appearing.     Comments: Very pleasant female appears stated age in no acute distress sitting comfortably in exam room  HENT:     Head: Normocephalic and atraumatic.  Cardiovascular:     Rate and Rhythm: Normal rate and regular rhythm.     Heart sounds: Normal heart sounds, S1 normal and S2 normal. No murmur heard. Pulmonary:     Effort: Pulmonary effort is normal.     Breath sounds: Normal breath sounds. No wheezing, rhonchi or rales.     Comments: Clear to auscultation bilaterally Abdominal:     General: Bowel sounds are normal.     Palpations: Abdomen is soft.     Tenderness: There is no abdominal tenderness. There is no right CVA tenderness, left CVA tenderness, guarding or rebound.     Comments: Benign abdominal exam  Genitourinary:    Comments: Exam deferred Psychiatric:        Behavior: Behavior is cooperative.      UC Treatments / Results  Labs (all labs ordered are listed, but only abnormal results are displayed) Labs Reviewed  HEPATITIS C ANTIBODY  HIV ANTIBODY (ROUTINE TESTING W REFLEX)  RPR  POC URINE PREG, ED  CERVICOVAGINAL ANCILLARY ONLY    EKG   Radiology No results found.  Procedures Procedures (including critical care time)  Medications Ordered in UC Medications - No data to display  Initial Impression / Assessment and Plan / UC Course  I have reviewed the triage vital signs and the nursing notes.  Pertinent labs & imaging results that were available during my care of the patient were reviewed by me and considered in my medical decision making (see chart for details).     STI panel collected today-results pending.  Patient is asymptomatic so we will defer treatment until results are available.  Discussed the importance of safe sex practices.  She is to abstain from sex until results are available.  Urine pregnancy test was negative in clinic.  She was provided in STI education back with  condoms.  Discussed that if develops any symptoms she is to return for reevaluation.  Final Clinical Impressions(s) / UC Diagnoses  Final diagnoses:  Routine screening for STI (sexually transmitted infection)  Encounter for pregnancy test with result negative     Discharge Instructions      Your pregnancy test was negative.  We will contact you if we need to arrange any treatment based on your STI results.  Monitor your MyChart for these results.  Please abstain from sex until you receive your results.  You should use a condom with each sexual encounter.  If you develop any symptoms please return for reevaluation.     ED Prescriptions   None    PDMP not reviewed this encounter.   Jeani Hawking, PA-C 12/25/21 1035

## 2021-12-25 NOTE — ED Triage Notes (Signed)
Patient requesting full std panel and pregnancy test today. Denies symptoms or specific exposure

## 2021-12-26 LAB — CERVICOVAGINAL ANCILLARY ONLY
Bacterial Vaginitis (gardnerella): POSITIVE — AB
Candida Glabrata: NEGATIVE
Candida Vaginitis: NEGATIVE
Chlamydia: NEGATIVE
Comment: NEGATIVE
Comment: NEGATIVE
Comment: NEGATIVE
Comment: NEGATIVE
Comment: NEGATIVE
Comment: NORMAL
Neisseria Gonorrhea: NEGATIVE
Trichomonas: NEGATIVE

## 2021-12-27 ENCOUNTER — Telehealth (HOSPITAL_COMMUNITY): Payer: Self-pay | Admitting: Emergency Medicine

## 2021-12-27 MED ORDER — METRONIDAZOLE 500 MG PO TABS: 500.0000 mg | ORAL_TABLET | Freq: Two times a day (BID) | ORAL | 0 refills | Status: DC

## 2021-12-27 NOTE — Telephone Encounter (Signed)
Patient's blood work from yesterday was cancelled  Patient called to check on it and I updated her, she would like to return for recollect (Confirmed with the lab they do not have any samples to run)

## 2022-01-10 ENCOUNTER — Encounter (HOSPITAL_COMMUNITY): Payer: Self-pay | Admitting: *Deleted

## 2022-01-10 ENCOUNTER — Other Ambulatory Visit: Payer: Self-pay

## 2022-01-10 ENCOUNTER — Ambulatory Visit (HOSPITAL_COMMUNITY)
Admission: EM | Admit: 2022-01-10 | Discharge: 2022-01-10 | Disposition: A | Discharge status: Home / Self Care | Payer: Medicaid Other | Attending: Emergency Medicine | Admitting: Emergency Medicine

## 2022-01-10 DIAGNOSIS — N898 Other specified noninflammatory disorders of vagina: Secondary | ICD-10-CM

## 2022-01-10 LAB — HIV ANTIBODY (ROUTINE TESTING W REFLEX): HIV Screen 4th Generation wRfx: NONREACTIVE

## 2022-01-10 MED ORDER — METRONIDAZOLE 500 MG PO TABS: 500.0000 mg | ORAL_TABLET | Freq: Two times a day (BID) | ORAL | 0 refills | Status: DC

## 2022-01-10 NOTE — Discharge Instructions (Signed)
Today you are being treated prophylactically for  Bacterial vaginosis  ? ?Take Metronidazole 500 mg twice a day for 7 days, do not drink alcohol while using medication, this will make you feel sick  ? ?Bacterial vaginosis which results from an overgrowth of one on several organisms that are normally present in your vagina. Vaginosis is an inflammation of the vagina that can result in discharge, itching and pain. ? ?Labs pending 2-3 days, you will be contacted if positive for any sti and treatment will be sent to the pharmacy, you will have to return to the clinic if positive for gonorrhea to receive treatment  ? ?Please refrain from having sex until labs results, if positive please refrain from having sex until treatment complete and symptoms resolve  ? ?If positive for HIV, Syphilis, Chlamydia  gonorrhea or trichomoniasis please notify partner or partners so they may tested as well ? ?Moving forward, it is recommended you use some form of protection against the transmission of sti infections  such as condoms or dental dams with each sexual encounter   ? ? ?In addition: ?Avoid baths, hot tubs and whirlpool spas.  ?Don't use scented or harsh soaps ?Avoid irritants. These include scented tampons and pads. ?Wipe from front to back after using the toilet. ?Don't douche. Your vagina doesn't require cleansing other than normal bathing.  ?Use a condom.  ?Wear cotton underwear, this fabric absorbs some moisture.  ? ? ?  ?

## 2022-01-10 NOTE — ED Provider Notes (Signed)
[facility]    CSN: [id number] Arrival date & time: 01/10/22  1731      History   Chief Complaint Chief Complaint  Patient presents with   Vaginitis    HPI Jamie Neal is a 26 y.o. female.   Patient presents with vaginal irritation and itching beginning today.  Endorses that she has a new partner and would like to complete routine testing.  Endorses that she did not complete treatment for bacterial vaginosis as her child threw some of the pills down the toilet.  Denies vaginal discharge, urinary symptoms.  Last menstrual period 12/23/2021.    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   Late prenatal care affecting pregnancy, antepartum, third trimester 02/17/2020   Insufficient prenatal care in third trimester 02/17/2020   Supervision of low-risk pregnancy 08/18/2019   Obesity (BMI 30.0-34.9) 08/18/2019    Past Surgical History:  Procedure Laterality Date   CESAREAN SECTION  03/12/2020   Procedure: CESAREAN SECTION;  Surgeon: Warden Fillers, MD;  Location: [facility];  Service: Obstetrics;;   NO PAST SURGERIES      OB History     Gravida  2   Para  2   Term  2   Preterm      AB      Living  2      SAB      IAB      Ectopic      Multiple  0   Live Births  2            Home Medications    Prior to Admission medications   Medication Sig Start Date End Date Taking? Authorizing Provider  fluconazole (DIFLUCAN) 150 MG tablet Take 1 tablet (150 mg total) by mouth daily. -For your yeast infection, start the Diflucan (fluconazole)- Take one pill today (day 1). If you're still having symptoms in 3 days, take the second pill. 05/05/21   Rhys Martini, PA-C  metroNIDAZOLE (FLAGYL) 500 MG tablet Take 1 tablet (500 mg total) by mouth 2 (two) times daily.  12/27/21   Lamptey, Britta Mccreedy, MD    Family History Family History  Problem Relation Age of Onset   Healthy Mother    Diabetes Father    Hypertension Father     Social History Social History   Tobacco Use   Smoking status: Never   Smokeless tobacco: Never  Vaping Use   Vaping Use: Some days  Substance Use Topics   Alcohol use: Yes    Comment: occasionally   Drug use: Never     Allergies   Patient has no known allergies.   Review of Systems Review of Systems  Constitutional: Negative.   Respiratory: Negative.    Cardiovascular: Negative.   Genitourinary:  Positive for vaginal pain. Negative for decreased urine volume, difficulty urinating, dyspareunia, dysuria, enuresis, flank pain, frequency, genital sores, hematuria, menstrual problem, pelvic pain, urgency, vaginal bleeding and vaginal discharge.  Skin: Negative.   Neurological: Negative.      Physical Exam Triage Vital Signs ED Triage Vitals  Enc Vitals Group     BP 01/10/22 1902 99/61     Pulse Rate 01/10/22 1902 84     Resp 01/10/22 1902 16     Temp 01/10/22 1902 98.4 F (  36.9 C)     Temp src --      SpO2 01/10/22 1902 100 %     Weight --      Height --      Head Circumference --      Peak Flow --      Pain Score 01/10/22 1900 0     Pain Loc --      Pain Edu? --      Excl. in GC? --    No data found.  Updated Vital Signs BP 99/61   Pulse 84   Temp 98.4 F (36.9 C)   Resp 16   LMP 11/22/2021   SpO2 100%   Visual Acuity Right Eye Distance:   Left Eye Distance:   Bilateral Distance:    Right Eye Near:   Left Eye Near:    Bilateral Near:     Physical Exam Vitals and nursing note reviewed.  Constitutional:      General: She is not in acute distress.    Appearance: She is well-developed.  HENT:     Head: Normocephalic.  Eyes:     Conjunctiva/sclera: Conjunctivae normal.  Abdominal:     Palpations: Abdomen is soft.     Tenderness: There is no abdominal tenderness.   Genitourinary:    Comments: deferred Musculoskeletal:        General: No swelling.  Skin:    General: Skin is warm and dry.     Capillary Refill: Capillary refill takes less than 2 seconds.  Neurological:     Mental Status: She is alert.  Psychiatric:        Mood and Affect: Mood normal.      UC Treatments / Results  Labs (all labs ordered are listed, but only abnormal results are displayed) Labs Reviewed  HIV ANTIBODY (ROUTINE TESTING W REFLEX)  RPR  CERVICOVAGINAL ANCILLARY ONLY    EKG   Radiology No results found.  Procedures Procedures (including critical care time)  Medications Ordered in UC Medications - No data to display  Initial Impression / Assessment and Plan / UC Course  I have reviewed the triage vital signs and the nursing notes.  Pertinent labs & imaging results that were available during my care of the patient were reviewed by me and considered in my medical decision making (see chart for details).  Vaginal irritation  We will prophylactically treat for BV as patient did not finish recent course of medicine. STI labs pending will treat per protocol, advised abstinence until lab results, and/or treatment is complete, advised condom use during all sexual encounters moving, may follow-up with urgent care as needed  Final Clinical Impressions(s) / UC Diagnoses   Final diagnoses:  None   Discharge Instructions   None    ED Prescriptions   None    PDMP not reviewed this encounter.   Valinda Hoar, NP 01/10/22 1922

## 2022-01-10 NOTE — ED Triage Notes (Signed)
Pt reports she still has irritation to vaginal area.

## 2022-01-11 LAB — CERVICOVAGINAL ANCILLARY ONLY
Bacterial Vaginitis (gardnerella): NEGATIVE
Candida Glabrata: NEGATIVE
Candida Vaginitis: POSITIVE — AB
Chlamydia: NEGATIVE
Comment: NEGATIVE
Comment: NEGATIVE
Comment: NEGATIVE
Comment: NEGATIVE
Comment: NEGATIVE
Comment: NORMAL
Neisseria Gonorrhea: NEGATIVE
Trichomonas: NEGATIVE

## 2022-01-11 LAB — RPR: RPR Ser Ql: NONREACTIVE

## 2022-01-12 ENCOUNTER — Telehealth (HOSPITAL_COMMUNITY): Payer: Self-pay | Admitting: Emergency Medicine

## 2022-01-12 MED ORDER — FLUCONAZOLE 150 MG PO TABS: 150.0000 mg | ORAL_TABLET | Freq: Once | ORAL | 0 refills | Status: AC

## 2022-01-16 ENCOUNTER — Telehealth (HOSPITAL_COMMUNITY): Payer: Self-pay | Admitting: Emergency Medicine

## 2022-01-16 NOTE — Telephone Encounter (Signed)
Patient called and states she is not feeling better from the Diflucan.  2 pills with 3 days of waiting should mean patient will need to wait a few more days for treatment to be completed.  Reviewed with patient and recommended return visit if symptoms do not improve by Wednesday.

## 2022-01-30 ENCOUNTER — Ambulatory Visit: Payer: Medicaid Other | Admitting: Family Medicine

## 2022-02-14 ENCOUNTER — Other Ambulatory Visit (HOSPITAL_COMMUNITY)
Admission: RE | Admit: 2022-02-14 | Discharge: 2022-02-14 | Disposition: A | Discharge status: Home / Self Care | Payer: Medicaid Other | Source: Ambulatory Visit | Attending: Obstetrics and Gynecology | Admitting: Obstetrics and Gynecology

## 2022-02-14 ENCOUNTER — Encounter: Payer: Self-pay | Admitting: Obstetrics and Gynecology

## 2022-02-14 ENCOUNTER — Ambulatory Visit (INDEPENDENT_AMBULATORY_CARE_PROVIDER_SITE_OTHER): Payer: Medicaid Other | Admitting: Obstetrics and Gynecology

## 2022-02-14 VITALS — BP 97/62 | HR 85 | Ht 60.0 in | Wt 171.0 lb

## 2022-02-14 DIAGNOSIS — N760 Acute vaginitis: Secondary | ICD-10-CM

## 2022-02-14 DIAGNOSIS — Z3009 Encounter for other general counseling and advice on contraception: Secondary | ICD-10-CM | POA: Diagnosis not present

## 2022-02-14 NOTE — Progress Notes (Signed)
26 yo P2 here for contraception counseling and evaluation of vaginitis. Patient recently took a plan B and reports irregular vaginal bleeding. She is sexually active using condoms. She desires STI testing. Patient also reports recurrent BV infections and desires to be tested. She is without any other complaints  Past Medical History:  Diagnosis Date   Medical history non-contributory    Past Surgical History:  Procedure Laterality Date   CESAREAN SECTION  03/12/2020   Procedure: CESAREAN SECTION;  Surgeon: Griffin Basil, MD;  Location: [facility];  Service: Obstetrics;;   NO PAST SURGERIES     Family History  Problem Relation Age of Onset   Healthy Mother    Diabetes Father    Hypertension Father    Social History   Tobacco Use   Smoking status: Never   Smokeless tobacco: Never  Vaping Use   Vaping Use: Former  Substance Use Topics   Alcohol use: Yes    Comment: occasionally   Drug use: Never   ROS See pertinent in HPI. All other systems reviewed and non contributory Blood pressure 97/62, pulse 85, height 5' (1.524 m), weight 171 lb (77.6 kg), last menstrual period 01/20/2022, not currently breastfeeding. GENERAL: Well-developed, well-nourished female in no acute distress.  NEURO: alert and oriented x 3  A/P 26 yo here for contraception counseling and vaginitis - self vaginal swab collected - Patient declined blood work for STI screening - Contraception options reviewed and patient remains undecided - Vulva care reviewed and advised to avoid scented products (soaps and laundry detergents) - Patient will be contacted with abnormal results

## 2022-02-14 NOTE — Progress Notes (Signed)
Pt states her cycle has been irregular since taking Plan B pill last month.   Pt would like to discuss BC options.  Pt states she get frequent BV.

## 2022-02-16 LAB — CERVICOVAGINAL ANCILLARY ONLY
Bacterial Vaginitis (gardnerella): NEGATIVE
Candida Glabrata: NEGATIVE
Candida Vaginitis: NEGATIVE
Chlamydia: NEGATIVE
Comment: NEGATIVE
Comment: NEGATIVE
Comment: NEGATIVE
Comment: NEGATIVE
Comment: NEGATIVE
Comment: NORMAL
Neisseria Gonorrhea: NEGATIVE
Trichomonas: NEGATIVE

## 2022-02-21 ENCOUNTER — Ambulatory Visit (HOSPITAL_COMMUNITY)
Admission: EM | Admit: 2022-02-21 | Discharge: 2022-02-21 | Disposition: A | Discharge status: Home / Self Care | Payer: Medicaid Other

## 2022-04-06 ENCOUNTER — Ambulatory Visit: Payer: Medicaid Other

## 2022-04-06 ENCOUNTER — Encounter (HOSPITAL_COMMUNITY): Payer: Self-pay | Admitting: Emergency Medicine

## 2022-04-06 ENCOUNTER — Other Ambulatory Visit: Payer: Self-pay

## 2022-04-06 ENCOUNTER — Ambulatory Visit (HOSPITAL_COMMUNITY)
Admission: EM | Admit: 2022-04-06 | Discharge: 2022-04-06 | Disposition: A | Discharge status: Home / Self Care | Payer: Medicaid Other | Attending: Internal Medicine | Admitting: Internal Medicine

## 2022-04-06 DIAGNOSIS — R350 Frequency of micturition: Secondary | ICD-10-CM

## 2022-04-06 DIAGNOSIS — N898 Other specified noninflammatory disorders of vagina: Secondary | ICD-10-CM

## 2022-04-06 DIAGNOSIS — N941 Unspecified dyspareunia: Secondary | ICD-10-CM | POA: Diagnosis present

## 2022-04-06 DIAGNOSIS — Z113 Encounter for screening for infections with a predominantly sexual mode of transmission: Secondary | ICD-10-CM | POA: Diagnosis not present

## 2022-04-06 LAB — POCT URINALYSIS DIPSTICK, ED / UC
Bilirubin Urine: NEGATIVE
Glucose, UA: NEGATIVE mg/dL
Ketones, ur: NEGATIVE mg/dL
Leukocytes,Ua: NEGATIVE
Nitrite: NEGATIVE
Protein, ur: NEGATIVE mg/dL
Specific Gravity, Urine: 1.025 (ref 1.005–1.030)
Urobilinogen, UA: 1 mg/dL (ref 0.0–1.0)
pH: 7 (ref 5.0–8.0)

## 2022-04-06 NOTE — ED Notes (Signed)
Patient initiated a face time call while intake in progress

## 2022-04-06 NOTE — ED Triage Notes (Signed)
Complains of dry and painful vagina with intercourse.  Patient has slight itching.  "Minor, clear" discharge .  Denies pain with urination

## 2022-04-06 NOTE — ED Provider Notes (Signed)
[facility]    CSN: [id number] Arrival date & time: 04/06/22  7846      History   Chief Complaint Chief Complaint  Patient presents with   SEXUALLY TRANSMITTED DISEASE    HPI Jamie Neal is a 26 y.o. female.   Patient presents urgent care for eval ration of clear vaginal discharge and vaginal itching that started a couple of days ago.  She is also experiencing dyspareunia and vaginal dryness starting a few days ago as well.  She does not use lubrication during intercourse and is sexually active with female partners.  In a monogamous relationship with 1 female partner.  No recent new sexual partners reported. Denies vaginal odor and vaginal rash.  Reporting urinary frequency without dysuria, urinary hesitancy, urinary urgency, fever/chills, nausea, vomiting, back pain, flank pain, hematuria, constipation, diarrhea, vomiting, or dizziness.  Declines HIV and syphilis testing today. LMP 03/20/22.      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   Late prenatal care affecting pregnancy, antepartum, third trimester 02/17/2020   Insufficient prenatal care in third trimester 02/17/2020   Supervision of low-risk pregnancy 08/18/2019   Obesity (BMI 30.0-34.9) 08/18/2019    Past Surgical History:  Procedure Laterality Date   CESAREAN SECTION  03/12/2020   Procedure: CESAREAN SECTION;  Surgeon: Warden Fillers, MD;  Location: [facility];  Service: Obstetrics;;   NO PAST SURGERIES      OB History     Gravida  2   Para  2   Term  2   Preterm      AB      Living  2      SAB      IAB      Ectopic      Multiple  0   Live Births  2            Home Medications    Prior to Admission medications   Not on File    Family History Family History  Problem Relation  Age of Onset   Healthy Mother    Diabetes Father    Hypertension Father     Social History Social History   Tobacco Use   Smoking status: Never   Smokeless tobacco: Never  Vaping Use   Vaping Use: Former  Substance Use Topics   Alcohol use: Yes    Comment: occasionally   Drug use: Never     Allergies   Patient has no known allergies.   Review of Systems Review of Systems Per HPI  Physical Exam Triage Vital Signs ED Triage Vitals  Enc Vitals Group     BP 04/06/22 0934 110/72     Pulse Rate 04/06/22 0934 97     Resp 04/06/22 0934 18     Temp 04/06/22 0934 98.2 F (36.8 C)     Temp Source 04/06/22 0934 Oral     SpO2 04/06/22 0934 98 %     Weight --      Height --      Head Circumference --      Peak Flow --      Pain Score 04/06/22 0932 7     Pain Loc --      Pain Edu? --      Excl. in GC? --  No data found.  Updated Vital Signs BP 110/72 (BP Location: Right Arm)   Pulse 97   Temp 98.2 F (36.8 C) (Oral)   Resp 18   LMP 03/20/2022   SpO2 98%   Visual Acuity Right Eye Distance:   Left Eye Distance:   Bilateral Distance:    Right Eye Near:   Left Eye Near:    Bilateral Near:     Physical Exam Vitals and nursing note reviewed.  Constitutional:      Appearance: She is not ill-appearing or toxic-appearing.  HENT:     Head: Normocephalic and atraumatic.     Right Ear: Hearing and external ear normal.     Left Ear: Hearing and external ear normal.     Nose: Nose normal.     Mouth/Throat:     Lips: Pink.     Mouth: Mucous membranes are moist.     Pharynx: No posterior oropharyngeal erythema.  Eyes:     General: Lids are normal. Vision grossly intact. Gaze aligned appropriately.     Extraocular Movements: Extraocular movements intact.     Conjunctiva/sclera: Conjunctivae normal.  Pulmonary:     Effort: Pulmonary effort is normal.  Genitourinary:    Comments: Deferred. Musculoskeletal:     Cervical back: Neck supple.  Skin:    General:  Skin is warm and dry.     Capillary Refill: Capillary refill takes less than 2 seconds.     Findings: No rash.  Neurological:     General: No focal deficit present.     Mental Status: She is alert and oriented to person, place, and time. Mental status is at baseline.     Cranial Nerves: No dysarthria or facial asymmetry.  Psychiatric:        Mood and Affect: Mood normal.        Speech: Speech normal.        Behavior: Behavior normal.        Thought Content: Thought content normal.        Judgment: Judgment normal.      UC Treatments / Results  Labs (all labs ordered are listed, but only abnormal results are displayed) Labs Reviewed  POCT URINALYSIS DIPSTICK, ED / UC - Abnormal; Notable for the following components:      Result Value   Hgb urine dipstick TRACE (*)    All other components within normal limits  CERVICOVAGINAL ANCILLARY ONLY    EKG   Radiology No results found.  Procedures Procedures (including critical care time)  Medications Ordered in UC Medications - No data to display  Initial Impression / Assessment and Plan / UC Course  I have reviewed the triage vital signs and the nursing notes.  Pertinent labs & imaging results that were available during my care of the patient were reviewed by me and considered in my medical decision making (see chart for details).   1.  Dyspareunia, screen for STD, urinary frequency Recommend use of water-based lubricant during intercourse to help with dyspareunia and vaginal dryness.   STI labs pending.  Patient declines HIV and syphilis testing today.  Will notify patient of positive results and treat accordingly when labs come back.  Patient to avoid sexual intercourse until screening testing comes back.  Education provided regarding safe sexual practices and patient encouraged to use protection to prevent spread of STIs.   Urinalysis unremarkable for signs of urinary tract infection. Advised increased water intake to stay  well hydrated. Decrease intake of urinary  irritants.   Discussed physical exam and available lab work findings in clinic with patient.  Counseled patient regarding appropriate use of medications and potential side effects for all medications recommended or prescribed today. Discussed red flag signs and symptoms of worsening condition,when to call the PCP office, return to urgent care, and when to seek higher level of care in the emergency department. Patient verbalizes understanding and agreement with plan. All questions answered. Patient discharged in stable condition.    Final Clinical Impressions(s) / UC Diagnoses   Final diagnoses:  Screen for STD (sexually transmitted disease)  Urinary frequency  Vaginal discharge  Dyspareunia in female     Discharge Instructions      Use a water-based lubricant during sexual intercourse to help with pain with intercourse and vaginal dryness.   Your STD testing has been sent to the lab and will come back in the next 2 to 3 days.  We will call you if any of your results are positive requiring treatment and treat you at that time.   Avoid sexual intercourse until your STD results come back.  If any of your STD results are positive, you will need to avoid sexual intercourse for 7 days while you are being treated to prevent spread of STD.  Condom use is the best way to prevent spread of STDs.  Return to urgent care as needed.      ED Prescriptions   None    PDMP not reviewed this encounter.   Joella Prince [place], [place] 04/06/22 [phone]

## 2022-04-06 NOTE — Discharge Instructions (Addendum)
Use a water-based lubricant during sexual intercourse to help with pain with intercourse and vaginal dryness.   Your urinalysis does not show signs of urinary tract infection.  Your STD testing has been sent to the lab and will come back in the next 2 to 3 days.  We will call you if any of your results are positive requiring treatment and treat you at that time.   Avoid sexual intercourse until your STD results come back.  If any of your STD results are positive, you will need to avoid sexual intercourse for 7 days while you are being treated to prevent spread of STD.  Condom use is the best way to prevent spread of STDs.  Return to urgent care as needed.

## 2022-04-07 ENCOUNTER — Telehealth: Payer: Self-pay | Admitting: Emergency Medicine

## 2022-04-07 LAB — CERVICOVAGINAL ANCILLARY ONLY
Bacterial Vaginitis (gardnerella): POSITIVE — AB
Candida Glabrata: NEGATIVE
Candida Vaginitis: POSITIVE — AB
Chlamydia: NEGATIVE
Comment: NEGATIVE
Comment: NEGATIVE
Comment: NEGATIVE
Comment: NEGATIVE
Comment: NEGATIVE
Comment: NORMAL
Neisseria Gonorrhea: NEGATIVE
Trichomonas: NEGATIVE

## 2022-04-07 MED ORDER — METRONIDAZOLE 500 MG PO TABS: 500.0000 mg | ORAL_TABLET | Freq: Two times a day (BID) | ORAL | 0 refills | Status: DC

## 2022-04-07 MED ORDER — FLUCONAZOLE 150 MG PO TABS: 150.0000 mg | ORAL_TABLET | Freq: Once | ORAL | 0 refills | Status: AC

## 2022-05-18 ENCOUNTER — Ambulatory Visit: Payer: Medicaid Other | Admitting: Obstetrics

## 2022-05-23 ENCOUNTER — Ambulatory Visit (INDEPENDENT_AMBULATORY_CARE_PROVIDER_SITE_OTHER): Payer: Medicaid Other | Admitting: General Practice

## 2022-05-23 ENCOUNTER — Other Ambulatory Visit (HOSPITAL_COMMUNITY)
Admission: RE | Admit: 2022-05-23 | Discharge: 2022-05-23 | Disposition: A | Discharge status: Home / Self Care | Payer: Medicaid Other | Source: Ambulatory Visit | Attending: Obstetrics & Gynecology | Admitting: Obstetrics & Gynecology

## 2022-05-23 VITALS — BP 103/67 | HR 72 | Ht 60.0 in | Wt 173.0 lb

## 2022-05-23 DIAGNOSIS — Z3201 Encounter for pregnancy test, result positive: Secondary | ICD-10-CM | POA: Diagnosis not present

## 2022-05-23 DIAGNOSIS — N912 Amenorrhea, unspecified: Secondary | ICD-10-CM

## 2022-05-23 DIAGNOSIS — Z113 Encounter for screening for infections with a predominantly sexual mode of transmission: Secondary | ICD-10-CM | POA: Insufficient documentation

## 2022-05-23 DIAGNOSIS — N898 Other specified noninflammatory disorders of vagina: Secondary | ICD-10-CM

## 2022-05-23 DIAGNOSIS — Z3493 Encounter for supervision of normal pregnancy, unspecified, third trimester: Secondary | ICD-10-CM

## 2022-05-23 LAB — POCT URINE PREGNANCY: Preg Test, Ur: POSITIVE — AB

## 2022-05-23 NOTE — Progress Notes (Signed)
SUBJECTIVE:  27 y.o. female presents for STD testing and UPT.  Denies abnormal vaginal bleeding or significant pelvic pain or fever. No UTI symptoms. Denies history of known exposure to STD.  Pt requesting UPT. States last period is some time in December.  Ms. [place] presents today for UPT. She has no unusual complaints. LMP: approximately 04-14-22     OBJECTIVE: Appears well, in no apparent distress.  OB History     Gravida  2   Para  2   Term  2   Preterm      AB      Living  2      SAB      IAB      Ectopic      Multiple  0   Live Births  2          Home UPT Result:NA In-Office UPT result:Positive  I have reviewed the patient's medical, obstetrical, social, and family histories, and medications.   OBJECTIVE:  She appears well, afebrile. Urine dipstick: not done.  ASSESSMENT:  Vaginal Discharge  Vaginal Odor Positive Pregnancy Test   PLAN:  GC, chlamydia, trichomonas, BVAG, CVAG probe sent to lab. Pt is unsure if she wants to follow through with pregnancy. Resources given. NOB intake and visit scheduled. Pt will call office if she decides not to follow though with pregnancy.   Treatment: To be determined once lab results are received ROV prn if symptoms persist or worsen.

## 2022-05-24 LAB — CERVICOVAGINAL ANCILLARY ONLY
Chlamydia: NEGATIVE
Comment: NEGATIVE
Comment: NEGATIVE
Comment: NORMAL
Neisseria Gonorrhea: NEGATIVE
Trichomonas: NEGATIVE

## 2022-05-24 LAB — HEPATITIS B SURFACE ANTIGEN: Hepatitis B Surface Ag: NEGATIVE

## 2022-05-24 LAB — HEPATITIS C ANTIBODY: Hep C Virus Ab: NONREACTIVE

## 2022-05-24 LAB — RPR: RPR Ser Ql: NONREACTIVE

## 2022-05-24 LAB — HIV ANTIBODY (ROUTINE TESTING W REFLEX): HIV Screen 4th Generation wRfx: NONREACTIVE

## 2022-05-24 LAB — BETA HCG QUANT (REF LAB): hCG Quant: 955 m[IU]/mL

## 2022-06-15 ENCOUNTER — Telehealth: Payer: Self-pay | Admitting: *Deleted

## 2022-06-15 ENCOUNTER — Ambulatory Visit: Payer: Medicaid Other | Admitting: Obstetrics

## 2022-06-15 NOTE — Telephone Encounter (Signed)
TC from pt with questions about upcoming SAB follow up appt. Reports VB that is "like a period". Denies severe abdominal pain or soaking 1 pad or more per hour. Advised that provider would discuss SAB and assess for signs of retained POC and may offer further lab or [place] testing as needed based on pt's hx. Pt verbalized understanding and since her condition is non acute has decided to postpone f/u appt until 06/21/22.

## 2022-06-21 ENCOUNTER — Ambulatory Visit: Payer: Medicaid Other | Admitting: Obstetrics

## 2022-06-22 ENCOUNTER — Ambulatory Visit: Payer: Medicaid Other | Admitting: Obstetrics

## 2022-06-23 ENCOUNTER — Encounter (HOSPITAL_COMMUNITY): Payer: Self-pay | Admitting: Obstetrics & Gynecology

## 2022-06-23 ENCOUNTER — Inpatient Hospital Stay (HOSPITAL_COMMUNITY): Payer: Medicaid Other

## 2022-06-23 ENCOUNTER — Encounter (HOSPITAL_COMMUNITY): Payer: Self-pay | Admitting: *Deleted

## 2022-06-23 ENCOUNTER — Inpatient Hospital Stay (HOSPITAL_COMMUNITY)
Admission: AD | Admit: 2022-06-23 | Discharge: 2022-06-23 | Discharge status: Against Medical Advice | Payer: Medicaid Other | Attending: Obstetrics & Gynecology | Admitting: Obstetrics & Gynecology

## 2022-06-23 ENCOUNTER — Ambulatory Visit (HOSPITAL_COMMUNITY)
Admission: EM | Admit: 2022-06-23 | Discharge: 2022-06-23 | Disposition: A | Discharge status: Home / Self Care | Payer: Medicaid Other | Attending: Physician Assistant | Admitting: Physician Assistant

## 2022-06-23 DIAGNOSIS — Z3A10 10 weeks gestation of pregnancy: Secondary | ICD-10-CM | POA: Diagnosis not present

## 2022-06-23 DIAGNOSIS — N939 Abnormal uterine and vaginal bleeding, unspecified: Secondary | ICD-10-CM

## 2022-06-23 DIAGNOSIS — N898 Other specified noninflammatory disorders of vagina: Secondary | ICD-10-CM

## 2022-06-23 DIAGNOSIS — O039 Complete or unspecified spontaneous abortion without complication: Secondary | ICD-10-CM | POA: Diagnosis present

## 2022-06-23 DIAGNOSIS — Z3201 Encounter for pregnancy test, result positive: Secondary | ICD-10-CM | POA: Diagnosis present

## 2022-06-23 HISTORY — DX: Urinary tract infection, site not specified: N39.0

## 2022-06-23 LAB — CBC
HCT: 36.4 % (ref 36.0–46.0)
Hemoglobin: 12.3 g/dL (ref 12.0–15.0)
MCH: 29.9 pg (ref 26.0–34.0)
MCHC: 33.8 g/dL (ref 30.0–36.0)
MCV: 88.3 fL (ref 80.0–100.0)
Platelets: 252 10*3/uL (ref 150–400)
RBC: 4.12 MIL/uL (ref 3.87–5.11)
RDW: 13.3 % (ref 11.5–15.5)
WBC: 5 10*3/uL (ref 4.0–10.5)
nRBC: 0 % (ref 0.0–0.2)

## 2022-06-23 LAB — HCG, QUANTITATIVE, PREGNANCY: hCG, Beta Chain, Quant, S: 190 m[IU]/mL — ABNORMAL HIGH (ref <5)

## 2022-06-23 LAB — POC URINE PREG, ED: Preg Test, Ur: POSITIVE — AB

## 2022-06-23 NOTE — ED Notes (Signed)
Patient is being discharged from the Urgent Care and sent to the Emergency Department via POV . Per Verna Czech, PA, patient is in need of higher level of care due to higher level of care needed. Patient is aware and verbalizes understanding of plan of care.  Vitals:   06/23/22 0854  BP: 104/66  Pulse: 78  Resp: 18  Temp: 98.6 F (37 C)  SpO2: 98%

## 2022-06-23 NOTE — MAU Note (Signed)
MAU Registration notified this RN that the patient left AMA. NP and Agricultural consultant notified.

## 2022-06-23 NOTE — MAU Note (Signed)
Jamie Neal is a 27 y.o. at 61w3dhere in MAU reporting: had a miscarriage like 2 wks ago(while in [place], still bleeding.  Went to UC for a check up, was told still coming up as preg, so they sent her here for an [place]  (Passed a clot and saw the baby in it, while she was at her mom's.  Did not get seen up there, because of insurance)  has continued to bleed, seems heavier at night, scant during the day.a wk ago was having pain, none now.   Onset of complaint: 2 wks ago Pain score: none Vitals:   06/23/22 0957  BP: 105/64  Pulse: 77  Resp: 17  Temp: 98 F (36.7 C)  SpO2: 99%     Lab orders placed from triage:

## 2022-06-23 NOTE — ED Provider Notes (Signed)
[place]    CSN: [id number] Arrival date & time: 06/23/22  0827      History   Chief Complaint Chief Complaint  Patient presents with   SEXUALLY TRANSMITTED DISEASE    HPI Jamie Neal is a 27 y.o. female.   Patient presents today for evaluation after miscarriage.  Reports that she believes she was approximately [redacted] weeks pregnant as her last menstrual cycle was 04/11/2022 when she started bleeding and passed what she believes was fetal tissue approximately 2 weeks ago.  She initially had very heavy bleeding but this has improved and now she continues to spot.  She also reports that there is a significant odor and so she is concerned about infection.  She denies any fever, nausea, vomiting.  She is open to STI testing but has no specific concern.  She has had 2 other pregnancies that resulted in healthy term babies.  This is her first miscarriage.    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   Late prenatal care affecting pregnancy, antepartum, third trimester 02/17/2020   Insufficient prenatal care in third trimester 02/17/2020   Supervision of low-risk pregnancy 08/18/2019   Obesity (BMI 30.0-34.9) 08/18/2019    Past Surgical History:  Procedure Laterality Date   CESAREAN SECTION  03/12/2020   Procedure: CESAREAN SECTION;  Surgeon: Griffin Basil, MD;  Location: [facility];  Service: Obstetrics;;   NO PAST SURGERIES      OB History     Gravida  2   Para  2   Term  2   Preterm      AB      Living  2      SAB      IAB      Ectopic      Multiple  0   Live Births  2            Home Medications    Prior to Admission medications   Not on File    Family History Family History  Problem Relation Age of Onset   Healthy Mother    Diabetes  Father    Hypertension Father     Social History Social History   Tobacco Use   Smoking status: Never   Smokeless tobacco: Never  Vaping Use   Vaping Use: Former  Substance Use Topics   Alcohol use: Yes    Comment: occasionally   Drug use: Never     Allergies   Patient has no known allergies.   Review of Systems Review of Systems  Constitutional:  Negative for activity change, appetite change, fatigue and fever.  Gastrointestinal:  Negative for abdominal pain, diarrhea, nausea and vomiting.  Genitourinary:  Positive for menstrual problem, vaginal bleeding and vaginal discharge. Negative for dysuria, pelvic pain, urgency and vaginal pain.     Physical Exam Triage Vital Signs ED Triage Vitals  Enc Vitals Group     BP 06/23/22 0854 104/66     Pulse Rate 06/23/22 0854 78     Resp 06/23/22 0854 18     Temp 06/23/22 0854 98.6 F (37 C)     Temp Source 06/23/22 0854 Oral     SpO2 06/23/22 0854 98 %     Weight --      Height --  Head Circumference --      Peak Flow --      Pain Score 06/23/22 0852 0     Pain Loc --      Pain Edu? --      Excl. in [place]? --    No data found.  Updated Vital Signs BP 104/66 (BP Location: Right Arm)   Pulse 78   Temp 98.6 F (37 C) (Oral)   Resp 18   LMP 04/11/2022   SpO2 98%   Visual Acuity Right Eye Distance:   Left Eye Distance:   Bilateral Distance:    Right Eye Near:   Left Eye Near:    Bilateral Near:     Physical Exam Vitals reviewed.  Constitutional:      General: She is awake. She is not in acute distress.    Appearance: Normal appearance. She is well-developed. She is not ill-appearing.     Comments: Very pleasant female appears stated age in no acute distress sitting comfortably in exam room  HENT:     Head: Normocephalic and atraumatic.  Cardiovascular:     Rate and Rhythm: Normal rate and regular rhythm.     Heart sounds: Normal heart sounds, S1 normal and S2 normal. No murmur heard. Pulmonary:      Effort: Pulmonary effort is normal.     Breath sounds: Normal breath sounds. No wheezing, rhonchi or rales.     Comments: Clear to auscultation bilaterally Abdominal:     General: Bowel sounds are normal.     Palpations: Abdomen is soft.     Tenderness: There is no abdominal tenderness. There is no right CVA tenderness, left CVA tenderness, guarding or rebound.     Comments: Benign abdominal exam  Psychiatric:        Behavior: Behavior is cooperative.      UC Treatments / Results  Labs (all labs ordered are listed, but only abnormal results are displayed) Labs Reviewed  POC URINE PREG, ED - Abnormal; Notable for the following components:      Result Value   Preg Test, Ur POSITIVE (*)    All other components within normal limits  CERVICOVAGINAL ANCILLARY ONLY    EKG   Radiology No results found.  Procedures Procedures (including critical care time)  Medications Ordered in UC Medications - No data to display  Initial Impression / Assessment and Plan / UC Course  I have reviewed the triage vital signs and the nursing notes.  Pertinent labs & imaging results that were available during my care of the patient were reviewed by me and considered in my medical decision making (see chart for details).     Patient is well-appearing, afebrile, nontoxic, nontachycardic.  She had a positive pregnancy test in clinic today.  Discussed that it is possible that this will remain positive for several weeks after miscarriage but given her associated ongoing spotting and odor would recommend she go to the MAU to ensure there is no retained fetal tissue since we do not have imaging capabilities in urgent care.  Patient is agreeable and will go directly to MAU.  STI swab was collected today we will contact her if we need to arrange any treatment.  Patient was stable at time of discharge.  Final Clinical Impressions(s) / UC Diagnoses   Final diagnoses:  Positive pregnancy test  Vaginal  bleeding  Vaginal odor     Discharge Instructions      Please go directly to the MAU for further evaluation  and management.     ED Prescriptions   None    PDMP not reviewed this encounter.   Terrilee Croak, PA-C 06/23/22 [id number]

## 2022-06-23 NOTE — ED Triage Notes (Addendum)
Pt states she had a miscarriage 2 weeks ago she would have been [redacted] weeks pregnant. She states she hasn't seen anyone since and she would like a pregnancy test to confirm she miscarried. She states she is still having some vaginal bleeding. She states she has been bleeding since so she hasn't had sex, however she would like STI testing.

## 2022-06-23 NOTE — Discharge Instructions (Signed)
Please go directly to the MAU for further evaluation and management.

## 2022-06-23 NOTE — MAU Provider Note (Signed)
History     [id number]  Arrival date and time: 06/23/22 [id number]    Chief Complaint  Patient presents with   Vaginal Bleeding     HPI Jamie Neal is a 27 y.o. at 75w3dwho presents for vaginal bleeding. [place] she believes she had a miscarriage. Reports heavy bleeding at the time with passage of what looked like a fetus. Since then has continued to have bleeding. Bleeding is worse at night but during the day decreases. Initially was saturating a pad at night. Currently goes through 2 pads per day. Went to urgent care today & had a positive urine pregnancy test so was sent here. Denies fever or abdominal pain.   OB History     Gravida  3   Para  2   Term  2   Preterm      AB      Living  2      SAB      IAB      Ectopic      Multiple  0   Live Births  2           Past Medical History:  Diagnosis Date   UTI (urinary tract infection)     Past Surgical History:  Procedure Laterality Date   CESAREAN SECTION  03/12/2020   Procedure: CESAREAN SECTION;  Surgeon: BGriffin Basil MD;  Location: [facility];  Service: Obstetrics;;    Family History  Problem Relation Age of Onset   Healthy Mother    Diabetes Father    Hypertension Father     No Known Allergies  No current facility-administered medications on file prior to encounter.   No current outpatient medications on file prior to encounter.     ROS Pertinent positives and negative per HPI, all others reviewed and negative  Physical Exam   BP 105/64 (BP Location: Right Arm)   Pulse 77   Temp 98 F (36.7 C) (Oral)   Resp 17   Ht 5' (1.524 m)   Wt 80.1 kg   LMP 04/11/2022   SpO2 99%   BMI 34.47 kg/m   Patient Vitals for the past 24 hrs:  BP Temp Temp src Pulse Resp SpO2 Height Weight  06/23/22 0957 105/64 98 F (36.7 C) Oral 77 17 99 % 5' (1.524 m) 80.1 kg    Physical Exam Vitals and nursing note reviewed.  Constitutional:      General:  She is not in acute distress.    Appearance: She is well-developed. She is not ill-appearing.  HENT:     Head: Normocephalic and atraumatic.  Eyes:     General: No scleral icterus.       Right eye: No discharge.        Left eye: No discharge.     Conjunctiva/sclera: Conjunctivae normal.  Pulmonary:     Effort: Pulmonary effort is normal. No respiratory distress.  Neurological:     General: No focal deficit present.     Mental Status: She is alert.  Psychiatric:        Mood and Affect: Mood normal.        Behavior: Behavior normal.       Labs Results for orders placed or performed during the hospital encounter of 06/23/22 (from the past 24 hour(s))  CBC     Status: None   Collection Time: 06/23/22 10:18 AM  Result Value Ref Range   WBC  5.0 4.0 - 10.5 K/uL   RBC 4.12 3.87 - 5.11 MIL/uL   Hemoglobin 12.3 12.0 - 15.0 g/dL   HCT 36.4 36.0 - 46.0 %   MCV 88.3 80.0 - 100.0 fL   MCH 29.9 26.0 - 34.0 pg   MCHC 33.8 30.0 - 36.0 g/dL   RDW 13.3 11.5 - 15.5 %   Platelets 252 150 - 400 K/uL   nRBC 0.0 0.0 - 0.2 %  hCG, quantitative, pregnancy     Status: Abnormal   Collection Time: 06/23/22 10:18 AM  Result Value Ref Range   hCG, Beta Chain, Quant, S 190 (H) <5 mIU/mL    Imaging US OB LESS THAN 14 WEEKS WITH OB TRANSVAGINAL  Result Date: 06/23/2022 CLINICAL DATA:  Vaginal bleeding in pregnancy, first trimester. Quantitative HCG now 190, down from 955 on 05/23/2022. EXAM: Ob pelvic ultrasound TECHNIQUE: Transabdominal and transvaginalultrasound examination of the pelvis was performed including evaluation of the uterus, ovaries, adnexal regions, and pelvic cul-de-sac. COMPARISON:  None Available. FINDINGS: Uterusanteverted, 10 x 6 x 5 cm. The endometrium 0.9 cm. The uterine cavity is empty. No intrauterine pregnancy identified. There are no uterine masses. Right ovary Unremarkable, 2.8 x 1.6 x 1.6 cm. Left ovary Unremarkable, 2.5 x 2.2 x 1.2 cm. Images of the adnexae demonstrated no  masses or fluid collections. IMPRESSION: 1. Unremarkable examination of the pelvis. 2. No intrauterine or ectopic pregnancy identified. 3. Correlation with quantitative HCG measurements recommended with follow up imaging to determine viability, if indicated. Electronically Signed   By: Sammie Bench M.D.   On: 06/23/2022 11:22    MAU Course  Procedures Lab Orders         CBC         hCG, quantitative, pregnancy    No orders of the defined types were placed in this encounter.  Imaging Orders         US OB LESS THAN 14 WEEKS WITH OB TRANSVAGINAL     MDM RH positive  Ultrasound shows no IUP or evidence of retained POCs. HCG today is 190. Hemoglobin stable.  Patient follow up appointment scheduled with [facility] on Tuesday Assessment and Plan   1. Miscarriage   2. [redacted] weeks gestation of pregnancy    -Patient left prior to results. Spoke with patient over the phone regarding results. Will recheck HCG on Tuesday when she sees Dr. Jennings Books, NP 06/23/22 3:52 PM

## 2022-06-26 ENCOUNTER — Telehealth (HOSPITAL_COMMUNITY): Payer: Self-pay | Admitting: Emergency Medicine

## 2022-06-26 LAB — CERVICOVAGINAL ANCILLARY ONLY
Bacterial Vaginitis (gardnerella): POSITIVE — AB
Candida Glabrata: NEGATIVE
Candida Vaginitis: POSITIVE — AB
Chlamydia: NEGATIVE
Comment: NEGATIVE
Comment: NEGATIVE
Comment: NEGATIVE
Comment: NEGATIVE
Comment: NEGATIVE
Comment: NORMAL
Neisseria Gonorrhea: NEGATIVE
Trichomonas: NEGATIVE

## 2022-06-26 MED ORDER — METRONIDAZOLE 500 MG PO TABS: 500.0000 mg | ORAL_TABLET | Freq: Two times a day (BID) | ORAL | 0 refills | Status: DC

## 2022-06-26 MED ORDER — FLUCONAZOLE 150 MG PO TABS: 150.0000 mg | ORAL_TABLET | Freq: Once | ORAL | 0 refills | Status: AC

## 2022-06-27 ENCOUNTER — Ambulatory Visit: Payer: Medicaid Other | Admitting: Obstetrics

## 2022-07-25 ENCOUNTER — Ambulatory Visit: Payer: Medicaid Other | Admitting: Family Medicine

## 2022-08-03 ENCOUNTER — Ambulatory Visit: Payer: Medicaid Other | Admitting: Obstetrics & Gynecology

## 2022-08-07 ENCOUNTER — Ambulatory Visit (INDEPENDENT_AMBULATORY_CARE_PROVIDER_SITE_OTHER): Payer: Medicaid Other | Admitting: Advanced Practice Midwife

## 2022-08-07 ENCOUNTER — Encounter: Payer: Self-pay | Admitting: Advanced Practice Midwife

## 2022-08-07 VITALS — BP 114/70 | HR 66 | Ht 60.0 in | Wt 176.7 lb

## 2022-08-07 DIAGNOSIS — Z113 Encounter for screening for infections with a predominantly sexual mode of transmission: Secondary | ICD-10-CM

## 2022-08-07 NOTE — Progress Notes (Signed)
   GYNECOLOGY PROGRESS NOTE  History:  27 y.o. G3P2002 presents to [facility] office today for problem gyn visit. She notified RN that she desire STI screening. When asked by provider, she expressed frustration at being asked again.  She did not answer any further questions about her health by CNM.      Of note, pt did wait in waiting room and room >30 minutes from her appt time to the time of provider arrival.   The following portions of the patient's history were reviewed and updated as appropriate: allergies, current medications, past family history, past medical history, past social history, past surgical history and problem list. Last pap smear on 01/28/2021 was normal.   Health Maintenance Due  Topic Date Due   COVID-19 Vaccine (1) Never done   HPV VACCINES (1 - 2-dose series) Never done   DTaP/Tdap/Td (1 - Tdap) Never done     Review of Systems:  Pertinent items are noted in HPI.   Objective:  Physical Exam Blood pressure 114/70, pulse 66, height 5' (1.524 m), weight 176 lb 11.2 oz (80.2 kg), last menstrual period 08/07/2022, not currently breastfeeding. VS reviewed, nursing note reviewed,  Constitutional: well developed, well nourished, no distress HEENT: normocephalic CV: normal rate Pulm/chest wall: normal effort Breast Exam: deferred Abdomen: soft Neuro: alert and oriented x 3 Skin: warm, dry Psych: affect normal Pelvic exam: Not performed  Assessment & Plan:  1. Routine screening for STI (sexually transmitted infection) --Pt told provider that she completed self swab and was directed to the lab for bloodwork to complete her screening. --Pt walked out of the office and did not complete swab or bloodwork today.   No follow-ups on file.   Sharen Counter, CNM 4:47 PM

## 2022-08-07 NOTE — Progress Notes (Signed)
Pt requesting full std panel.   Pt declined testing and left office.

## 2022-08-09 ENCOUNTER — Telehealth: Payer: Self-pay | Admitting: *Deleted

## 2022-08-09 NOTE — Telephone Encounter (Signed)
Returned call to patient.  She wanted to explain what happened at her visit Monday.  She left before visit was complete because she did not feel her provider was confident in her skills which then made Jamie Neal uncomfortable.  She has scheduled another appointment with a different provider.  I apologized to her for any inconvenience.

## 2022-08-10 ENCOUNTER — Ambulatory Visit (HOSPITAL_COMMUNITY)
Admission: EM | Admit: 2022-08-10 | Discharge: 2022-08-10 | Disposition: A | Discharge status: Home / Self Care | Payer: Medicaid Other | Attending: Sports Medicine | Admitting: Sports Medicine

## 2022-08-10 ENCOUNTER — Encounter (HOSPITAL_COMMUNITY): Payer: Self-pay

## 2022-08-10 DIAGNOSIS — N898 Other specified noninflammatory disorders of vagina: Secondary | ICD-10-CM | POA: Diagnosis present

## 2022-08-10 HISTORY — DX: Other specified bacterial agents as the cause of diseases classified elsewhere: N76.0

## 2022-08-10 HISTORY — DX: Other specified bacterial agents as the cause of diseases classified elsewhere: B96.89

## 2022-08-10 LAB — POCT URINALYSIS DIPSTICK, ED / UC
Bilirubin Urine: NEGATIVE
Glucose, UA: NEGATIVE mg/dL
Ketones, ur: NEGATIVE mg/dL
Leukocytes,Ua: NEGATIVE
Nitrite: NEGATIVE
Protein, ur: NEGATIVE mg/dL
Specific Gravity, Urine: 1.02 (ref 1.005–1.030)
Urobilinogen, UA: 1 mg/dL (ref 0.0–1.0)
pH: 8.5 — ABNORMAL HIGH (ref 5.0–8.0)

## 2022-08-10 LAB — POC URINE PREG, ED: Preg Test, Ur: NEGATIVE

## 2022-08-10 LAB — HIV ANTIBODY (ROUTINE TESTING W REFLEX): HIV Screen 4th Generation wRfx: NONREACTIVE

## 2022-08-10 MED ORDER — METRONIDAZOLE 500 MG PO TABS: 500.0000 mg | ORAL_TABLET | Freq: Two times a day (BID) | ORAL | 0 refills | Status: DC

## 2022-08-10 NOTE — ED Triage Notes (Signed)
Patient requests STI testing. Patient states she had a miscarriage in February. Patient states she is having vaginal discharge and reports lower abdominal pain only when she urinates. Patient states she has a foul odor and is not sure if it is the vaginal discharge or her urine. Patient states she has a "fishy" odor.

## 2022-08-10 NOTE — Discharge Instructions (Signed)
Your urine sample today did not look suspicious for urinary tract infection.  You are pregnancy test today was negative.  Your symptoms are suspicious again for bacterial vaginosis.  I have sent to your pharmacy metronidazole, Flagyl, 1 pill twice a day for 7 days.  Urgent care staff will call you with the results of your STI testing and we will send treatment if needed.  I recommend you abstain from sexual intercourse until you are labs have returned and you have been treated as well as your partner if needed.  Follow-up if your symptoms worsen or fail to improve.

## 2022-08-10 NOTE — ED Provider Notes (Signed)
[facility]    CSN: [id number] Arrival date & time: 08/10/22  0818      History   Chief Complaint No chief complaint on file.   HPI Jamie Neal is a 27 y.o. female.   She is here today with chief complaint of vaginal discharge with a fishy odor.  Patient reports she had a miscarriage back in February and has continued to have some spotting.  She reports she sees blood when she wipes but not throughout the day.  She describes her discharge as malodorous and gray.  She reports she had an ultrasound after her miscarriage that was negative for any retained parts of conception.  She started noticing a fishy odor and some dysuria about a week ago.  She is also requesting STI testing today.  She denies any new sexual partners but reports unprotected sexual intercourse.  She denies any fevers, chills, nausea, vomiting or abdominal cramping.     Past Medical History:  Diagnosis Date   BV (bacterial vaginosis)    UTI (urinary tract infection)     Patient Active Problem List   Diagnosis Date Noted   Herpes infection 02/25/2020   Obesity (BMI 30.0-34.9) 08/18/2019    Past Surgical History:  Procedure Laterality Date   CESAREAN SECTION  03/12/2020   Procedure: CESAREAN SECTION;  Surgeon: Warden Fillers, MD;  Location: [facility];  Service: Obstetrics;;    OB History     Gravida  3   Para  2   Term  2   Preterm      AB      Living  2      SAB      IAB      Ectopic      Multiple  0   Live Births  2            Home Medications    Prior to Admission medications   Medication Sig Start Date End Date Taking? Authorizing Provider  metroNIDAZOLE (FLAGYL) 500 MG tablet Take 1 tablet (500 mg total) by mouth 2 (two) times daily. Patient not taking: Reported on 08/07/2022 06/26/22   Lamptey, Britta Mccreedy, MD    Family History Family History  Problem Relation Age of Onset   Healthy Mother    Diabetes Father    Hypertension Father     Social  History Social History   Tobacco Use   Smoking status: Never   Smokeless tobacco: Never  Vaping Use   Vaping Use: Former  Substance Use Topics   Alcohol use: Not Currently    Comment: occasionally   Drug use: Never     Allergies   Patient has no known allergies.   Review of Systems Review of Systems As listed above in HPI  Physical Exam Triage Vital Signs ED Triage Vitals  Enc Vitals Group     BP 08/10/22 0905 103/72     Pulse Rate 08/10/22 0905 73     Resp 08/10/22 0905 16     Temp 08/10/22 0905 98.9 F (37.2 C)     Temp Source 08/10/22 0905 Oral     SpO2 08/10/22 0905 99 %     Weight --      Height --      Head Circumference --      Peak Flow --      Pain Score 08/10/22 0907 0     Pain Loc --      Pain Edu? --  Excl. in [place]? --    No data found.  Updated Vital Signs BP 103/72 (BP Location: Right Arm)   Pulse 73   Temp 98.9 F (37.2 C) (Oral)   Resp 16   LMP 08/07/2022 (Approximate)   SpO2 99%   Physical Exam Constitutional:      General: She is not in acute distress.    Appearance: Normal appearance. She is not ill-appearing, toxic-appearing or diaphoretic.  Cardiovascular:     Rate and Rhythm: Normal rate.  Pulmonary:     Effort: Pulmonary effort is normal.  Abdominal:     General: Abdomen is flat.     Palpations: Abdomen is soft.     Tenderness: There is no abdominal tenderness. There is no guarding.  Skin:    General: Skin is warm.  Neurological:     Mental Status: She is alert.      UC Treatments / Results  Labs (all labs ordered are listed, but only abnormal results are displayed) Labs Reviewed  CERVICOVAGINAL ANCILLARY ONLY    EKG   Radiology No results found.  Procedures Procedures (including critical care time)  Medications Ordered in UC Medications - No data to display  Initial Impression / Assessment and Plan / UC Course  I have reviewed the triage vital signs and the nursing notes.  Pertinent labs & imaging  results that were available during my care of the patient were reviewed by me and considered in my medical decision making (see chart for details).     Vaginal discharge and malodor UPT negative UA today negative for nitrates or leukocytes, positive for blood STI testing collected today including blood work Patient was treated today for bacterial vaginosis as high clinical suspicion, metronidazole twice a day for 7 days.  Urgent care staff will call her with any other abnormal results and further treatment.  I advised her to abstain from sexual intercourse until her labs have resulted and she and her partner have been treated as appropriate. Final Clinical Impressions(s) / UC Diagnoses   Final diagnoses:  None   Discharge Instructions   None    ED Prescriptions   None    PDMP not reviewed this encounter.   Gillermo Murdoch A, DO 08/10/22 1046

## 2022-08-11 ENCOUNTER — Telehealth (HOSPITAL_COMMUNITY): Payer: Self-pay | Admitting: Emergency Medicine

## 2022-08-11 LAB — CERVICOVAGINAL ANCILLARY ONLY
Bacterial Vaginitis (gardnerella): POSITIVE — AB
Candida Glabrata: NEGATIVE
Candida Vaginitis: POSITIVE — AB
Chlamydia: NEGATIVE
Comment: NEGATIVE
Comment: NEGATIVE
Comment: NEGATIVE
Comment: NEGATIVE
Comment: NEGATIVE
Comment: NORMAL
Neisseria Gonorrhea: NEGATIVE
Trichomonas: NEGATIVE

## 2022-08-11 LAB — RPR: RPR Ser Ql: NONREACTIVE

## 2022-08-11 MED ORDER — METRONIDAZOLE 500 MG PO TABS: 500.0000 mg | ORAL_TABLET | Freq: Two times a day (BID) | ORAL | 0 refills | Status: DC

## 2022-08-11 MED ORDER — FLUCONAZOLE 150 MG PO TABS: 150.0000 mg | ORAL_TABLET | Freq: Once | ORAL | 0 refills | Status: AC

## 2022-08-14 ENCOUNTER — Ambulatory Visit: Payer: Medicaid Other | Admitting: Obstetrics and Gynecology

## 2022-09-28 ENCOUNTER — Ambulatory Visit: Payer: Medicaid Other

## 2022-10-03 ENCOUNTER — Ambulatory Visit (HOSPITAL_COMMUNITY)
Admission: EM | Admit: 2022-10-03 | Discharge: 2022-10-03 | Disposition: A | Discharge status: Home / Self Care | Payer: Medicaid Other | Attending: Family Medicine | Admitting: Family Medicine

## 2022-10-03 ENCOUNTER — Encounter (HOSPITAL_COMMUNITY): Payer: Self-pay

## 2022-10-03 DIAGNOSIS — N760 Acute vaginitis: Secondary | ICD-10-CM

## 2022-10-03 DIAGNOSIS — R350 Frequency of micturition: Secondary | ICD-10-CM | POA: Diagnosis present

## 2022-10-03 DIAGNOSIS — Z113 Encounter for screening for infections with a predominantly sexual mode of transmission: Secondary | ICD-10-CM | POA: Insufficient documentation

## 2022-10-03 LAB — POCT URINALYSIS DIP (MANUAL ENTRY)
Bilirubin, UA: NEGATIVE — AB
Glucose, UA: NEGATIVE mg/dL
Ketones, POC UA: NEGATIVE mg/dL
Nitrite, UA: NEGATIVE
Protein Ur, POC: NEGATIVE mg/dL
Spec Grav, UA: 1.025 (ref 1.010–1.025)
Urobilinogen, UA: 0.2 E.U./dL
pH, UA: 7 (ref 5.0–8.0)

## 2022-10-03 LAB — HIV ANTIBODY (ROUTINE TESTING W REFLEX): HIV Screen 4th Generation wRfx: NONREACTIVE

## 2022-10-03 LAB — POCT URINE PREGNANCY: Preg Test, Ur: NEGATIVE

## 2022-10-03 MED ORDER — FLUCONAZOLE 150 MG PO TABS: 150.0000 mg | ORAL_TABLET | Freq: Once | ORAL | 0 refills | Status: AC

## 2022-10-03 NOTE — Discharge Instructions (Signed)
Vaginal cytology STD screening results will be available within 24 to 48 hours.  HIV and Fliss testing will also be available within 24 to 48 hours.  Our office will only contact you if your results are abnormal.  He had a trace of bacteria in your urine which is typically inconsistent for a UTI however given that you are having urine frequency I will culture the urine if any additional treatment is warranted after those results are made available we will send medication to the pharmacy.

## 2022-10-03 NOTE — ED Triage Notes (Signed)
Pt c/o brownish vaginal discharge with an odor and frequent urination x3 days. Pt requesting STD testing with blood work and a pregnancy test.

## 2022-10-03 NOTE — ED Provider Notes (Signed)
[facility]    CSN: [id number] Arrival date & time: 10/03/22  0851      History   Chief Complaint Chief Complaint  Patient presents with   SEXUALLY TRANSMITTED DISEASE    HPI Jamie Neal is a 27 y.o. female.   HPI Patient here today for routine STD screening.  Currently experiencing vaginal discharge which she describes as brown. Endorses frequent urination.  She endorses irritation during sexual intercourse and with placing a tampon inside of her vaginal orifice.  She reports the symptoms have been present for a few days.  Would like HIV and RPR testing.  Patient would also like a urine pregnancy test however her last documented period was 09/23/2022 however she reports cycle was abnormally short.  Denies any known exposure to any STDs.  Patient Active Problem List   Diagnosis Date Noted   Herpes infection 02/25/2020   Obesity (BMI 30.0-34.9) 08/18/2019    Past Surgical History:  Procedure Laterality Date   CESAREAN SECTION  03/12/2020   Procedure: CESAREAN SECTION;  Surgeon: Warden Fillers, MD;  Location: [facility];  Service: Obstetrics;;    OB History     Gravida  3   Para  2   Term  2   Preterm      AB      Living  2      SAB      IAB      Ectopic      Multiple  0   Live Births  2            Home Medications    Prior to Admission medications   Medication Sig Start Date End Date Taking? Authorizing Provider  fluconazole (DIFLUCAN) 150 MG tablet Take 1 tablet (150 mg total) by mouth once for 1 dose. 10/03/22 10/03/22 Yes Bing Neighbors, NP    Family History Family History  Problem Relation Age of Onset   Healthy Mother    Diabetes Father    Hypertension Father     Social History Social History   Tobacco Use   Smoking status: Never   Smokeless tobacco: Never  Vaping Use   Vaping Use: Former  Substance Use Topics   Alcohol use: Not Currently    Comment: occasionally   Drug use: Never     Allergies   Patient  has no known allergies.   Review of Systems Review of Systems Pertinent negatives listed in HPI  Physical Exam Triage Vital Signs ED Triage Vitals [10/03/22 0932]  Enc Vitals Group     BP 101/70     Pulse Rate 76     Resp 18     Temp 98.3 F (36.8 C)     Temp Source Oral     SpO2      Weight      Height      Head Circumference      Peak Flow      Pain Score 0     Pain Loc      Pain Edu?      Excl. in GC?    No data found.  Updated Vital Signs BP 101/70 (BP Location: Left Arm)   Pulse 76   Temp 98.3 F (36.8 C) (Oral)   Resp 18   LMP 09/23/2022   Visual Acuity Right Eye Distance:   Left Eye Distance:   Bilateral Distance:    Right Eye Near:   Left Eye Near:    Bilateral  Near:     Physical Exam General appearance: alert, well developed, well nourished, cooperative and in no distress Head: Normocephalic, without obvious abnormality, atraumatic Heart: rate and rhythm normal. No gallop or murmurs noted on exam  Respiratory: Respirations even and unlabored, normal respiratory rate Extremities: No gross deformities Skin: Skin color, texture, turgor normal. No rashes seen  Neurologic: Neurological focal abnormalities Vaginal cytology self collected UC Treatments / Results  Labs (all labs ordered are listed, but only abnormal results are displayed) Labs Reviewed  POCT URINALYSIS DIP (MANUAL ENTRY) - Abnormal; Notable for the following components:      Result Value   Bilirubin, UA negative (*)    Blood, UA trace-intact (*)    Leukocytes, UA Small (1+) (*)    All other components within normal limits  URINE CULTURE  HIV ANTIBODY (ROUTINE TESTING W REFLEX)  RPR  POCT URINE PREGNANCY  CERVICOVAGINAL ANCILLARY ONLY    EKG   Radiology No results found.  Procedures Procedures (including critical care time)  Medications Ordered in UC Medications - No data to display  Initial Impression / Assessment and Plan / UC Course  I have reviewed the triage  vital signs and the nursing notes.  Pertinent labs & imaging results that were available during my care of the patient were reviewed by me and considered in my medical decision making (see chart for details).    Urine frequency, UA shows a trace of bacteria will culture urine.  Vaginitis trial Diflucan while waiting on vaginal cytology to result.  Will screen for STDs along with HIV and RPR test are pending.  Urine hCG is negative.  Vies our office will contact her if any of her results are abnormal and require any further treatment. Final Clinical Impressions(s) / UC Diagnoses   Final diagnoses:  Urine frequency  Vaginitis and vulvovaginitis  Screening examination for STD (sexually transmitted disease)     Discharge Instructions      Vaginal cytology STD screening results will be available within 24 to 48 hours.  HIV and Fliss testing will also be available within 24 to 48 hours.  Our office will only contact you if your results are abnormal.  He had a trace of bacteria in your urine which is typically inconsistent for a UTI however given that you are having urine frequency I will culture the urine if any additional treatment is warranted after those results are made available we will send medication to the pharmacy.       ED Prescriptions     Medication Sig Dispense Auth. Provider   fluconazole (DIFLUCAN) 150 MG tablet Take 1 tablet (150 mg total) by mouth once for 1 dose. 1 tablet Bing Neighbors, NP      PDMP not reviewed this encounter.   Bing Neighbors, NP 10/03/22 1011

## 2022-10-04 LAB — CERVICOVAGINAL ANCILLARY ONLY
Bacterial Vaginitis (gardnerella): NEGATIVE
Candida Glabrata: NEGATIVE
Candida Vaginitis: POSITIVE — AB
Chlamydia: NEGATIVE
Comment: NEGATIVE
Comment: NEGATIVE
Comment: NEGATIVE
Comment: NEGATIVE
Comment: NEGATIVE
Comment: NORMAL
Neisseria Gonorrhea: NEGATIVE
Trichomonas: NEGATIVE

## 2022-10-04 LAB — URINE CULTURE: Special Requests: NORMAL

## 2022-10-04 LAB — RPR: RPR Ser Ql: NONREACTIVE

## 2022-10-10 ENCOUNTER — Ambulatory Visit (HOSPITAL_COMMUNITY)
Admission: EM | Admit: 2022-10-10 | Discharge: 2022-10-10 | Disposition: A | Discharge status: Home / Self Care | Payer: Medicaid Other | Attending: Family Medicine | Admitting: Family Medicine

## 2022-10-10 ENCOUNTER — Encounter (HOSPITAL_COMMUNITY): Payer: Self-pay | Admitting: Emergency Medicine

## 2022-10-10 DIAGNOSIS — B3731 Acute candidiasis of vulva and vagina: Secondary | ICD-10-CM | POA: Insufficient documentation

## 2022-10-10 DIAGNOSIS — R350 Frequency of micturition: Secondary | ICD-10-CM | POA: Diagnosis present

## 2022-10-10 LAB — POCT URINALYSIS DIP (MANUAL ENTRY)
Glucose, UA: NEGATIVE mg/dL
Nitrite, UA: NEGATIVE
Protein Ur, POC: NEGATIVE mg/dL
Spec Grav, UA: 1.025 (ref 1.010–1.025)
Urobilinogen, UA: 1 E.U./dL
pH, UA: 7 (ref 5.0–8.0)

## 2022-10-10 MED ORDER — FLUCONAZOLE 100 MG PO TABS: ORAL_TABLET | ORAL | 0 refills | Status: DC

## 2022-10-10 MED ORDER — NITROFURANTOIN MONOHYD MACRO 100 MG PO CAPS: 100.0000 mg | ORAL_CAPSULE | Freq: Two times a day (BID) | ORAL | 0 refills | Status: DC

## 2022-10-10 NOTE — ED Triage Notes (Signed)
Pt reports was seen here last week and was positive and treated for yeast. Reports still having vaginal irritation and urinary frequency. Pt requesting more medication and cyto swab be retested.

## 2022-10-10 NOTE — ED Provider Notes (Signed)
[facility]    CSN: [id number] Arrival date & time: 10/10/22  1238      History   Chief Complaint Chief Complaint  Patient presents with   Urinary Frequency   Vaginal Itching    HPI Jamie Neal is a 27 y.o. female.    Urinary Frequency  Vaginal Itching   Here for continued urinary frequency, vaginal irritation, and discharge. She was seen here June 4 at which time her urinalysis showed some leukocytes.  Her vaginal swab showed yeast only.  She did take the 2 doses of fluconazole and states that her symptoms may be improved a little bit have not taken that, but the symptoms continue.  She does continue to have urinary frequency.  Of note her urine culture showed multiple species, most likely contaminants.  She did have an HIV and RPR done which were both negative.  No fever or chills and no nausea or vomiting.  She does have some pelvic pressure.    Past Medical History:  Diagnosis Date   BV (bacterial vaginosis)    UTI (urinary tract infection)     Patient Active Problem List   Diagnosis Date Noted   Herpes infection 02/25/2020   Obesity (BMI 30.0-34.9) 08/18/2019    Past Surgical History:  Procedure Laterality Date   CESAREAN SECTION  03/12/2020   Procedure: CESAREAN SECTION;  Surgeon: Warden Fillers, MD;  Location: [facility];  Service: Obstetrics;;    OB History     Gravida  3   Para  2   Term  2   Preterm      AB      Living  2      SAB      IAB      Ectopic      Multiple  0   Live Births  2            Home Medications    Prior to Admission medications   Medication Sig Start Date End Date Taking? Authorizing Provider  fluconazole (DIFLUCAN) 100 MG tablet 2 tablets by mouth the first day, then 1 tablet daily for 6 more days. 10/10/22  Yes Zenia Resides, MD  nitrofurantoin, macrocrystal-monohydrate, (MACROBID) 100 MG capsule Take 1 capsule (100 mg total) by mouth 2 (two) times daily. 10/10/22  Yes  Coleta Grosshans, Janace Aris, MD    Family History Family History  Problem Relation Age of Onset   Healthy Mother    Diabetes Father    Hypertension Father     Social History Social History   Tobacco Use   Smoking status: Never   Smokeless tobacco: Never  Vaping Use   Vaping Use: Former  Substance Use Topics   Alcohol use: Not Currently    Comment: occasionally   Drug use: Never     Allergies   Patient has no known allergies.   Review of Systems Review of Systems  Genitourinary:  Positive for frequency.     Physical Exam Triage Vital Signs ED Triage Vitals  Enc Vitals Group     BP 10/10/22 1301 125/72     Pulse Rate 10/10/22 1301 76     Resp 10/10/22 1301 16     Temp 10/10/22 1301 98 F (36.7 C)     Temp Source 10/10/22 1301 Oral     SpO2 10/10/22 1301 98 %     Weight --      Height --      Head Circumference --  Peak Flow --      Pain Score 10/10/22 1300 0     Pain Loc --      Pain Edu? --      Excl. in GC? --    No data found.  Updated Vital Signs BP 125/72 (BP Location: Right Arm)   Pulse 76   Temp 98 F (36.7 C) (Oral)   Resp 16   LMP 09/23/2022   SpO2 98%   Visual Acuity Right Eye Distance:   Left Eye Distance:   Bilateral Distance:    Right Eye Near:   Left Eye Near:    Bilateral Near:     Physical Exam Vitals reviewed.  Constitutional:      General: She is not in acute distress.    Appearance: She is not ill-appearing, toxic-appearing or diaphoretic.  HENT:     Mouth/Throat:     Mouth: Mucous membranes are moist.  Eyes:     Extraocular Movements: Extraocular movements intact.     Conjunctiva/sclera: Conjunctivae normal.     Pupils: Pupils are equal, round, and reactive to light.  Cardiovascular:     Rate and Rhythm: Normal rate and regular rhythm.     Heart sounds: No murmur heard. Pulmonary:     Effort: Pulmonary effort is normal.     Breath sounds: Normal breath sounds.  Abdominal:     General: There is no distension.      Palpations: Abdomen is soft. There is no mass.     Tenderness: There is no guarding.     Comments: There is some suprapubic tenderness  Musculoskeletal:     Cervical back: Neck supple.  Lymphadenopathy:     Cervical: No cervical adenopathy.  Skin:    Coloration: Skin is not pale.  Neurological:     General: No focal deficit present.     Mental Status: She is alert and oriented to person, place, and time.  Psychiatric:        Behavior: Behavior normal.      UC Treatments / Results  Labs (all labs ordered are listed, but only abnormal results are displayed) Labs Reviewed  POCT URINALYSIS DIP (MANUAL ENTRY) - Abnormal; Notable for the following components:      Result Value   Bilirubin, UA small (*)    Ketones, POC UA trace (5) (*)    Blood, UA trace-lysed (*)    Leukocytes, UA Trace (*)    All other components within normal limits  URINE CULTURE  CERVICOVAGINAL ANCILLARY ONLY    EKG   Radiology No results found.  Procedures Procedures (including critical care time)  Medications Ordered in UC Medications - No data to display  Initial Impression / Assessment and Plan / UC Course  I have reviewed the triage vital signs and the nursing notes.  Pertinent labs & imaging results that were available during my care of the patient were reviewed by me and considered in my medical decision making (see chart for details).       Urinalysis is recollected.  I did go over clean-catch midstream urine collection in with her before the urine was collected.  Urinalysis shows a trace of leuks and blood  Urine culture is sent  I am going to go ahead and treat for possible UTI since she has urinary frequency and pelvic pressure.  Undescended Macrodantin for 5 days.  Also since she continues to have vaginal irritation I am going to send in fluconazole to take daily for a week.  We will notify her of any positives on the swab or if her urine culture shows she needs a  different antibiotic Final Clinical Impressions(s) / UC Diagnoses   Final diagnoses:  Urinary frequency  Vaginal candidiasis     Discharge Instructions      The urinalysis had a trace amount of white blood cells and red blood cells.  This could be a sign of a urinary infection.  The urine is sent for culture  Take nitrofurantoin 100 mg--1 capsule 2 times daily for 5 days; this is for potential urinary or bladder infection  Fluconazole 100 mg--2 tablets by mouth the first day then 1 tablet by mouth daily for 6 more days.  This is to hopefully treat the yeast infection better.  Will notify you if there is anything positive on the swab this time.  They will also notify you if the urine culture is completely negative or if it looks like he needed different antibiotic from the one I have sent in today.      ED Prescriptions     Medication Sig Dispense Auth. Provider   nitrofurantoin, macrocrystal-monohydrate, (MACROBID) 100 MG capsule Take 1 capsule (100 mg total) by mouth 2 (two) times daily. 10 capsule Zenia Resides, MD   fluconazole (DIFLUCAN) 100 MG tablet 2 tablets by mouth the first day, then 1 tablet daily for 6 more days. 8 tablet Zoriah Pulice, Janace Aris, MD      PDMP not reviewed this encounter.   Zenia Resides, MD 10/10/22 1328

## 2022-10-10 NOTE — Discharge Instructions (Signed)
The urinalysis had a trace amount of white blood cells and red blood cells.  This could be a sign of a urinary infection.  The urine is sent for culture  Take nitrofurantoin 100 mg--1 capsule 2 times daily for 5 days; this is for potential urinary or bladder infection  Fluconazole 100 mg--2 tablets by mouth the first day then 1 tablet by mouth daily for 6 more days.  This is to hopefully treat the yeast infection better.  Will notify you if there is anything positive on the swab this time.  They will also notify you if the urine culture is completely negative or if it looks like he needed different antibiotic from the one I have sent in today.

## 2022-10-11 LAB — CERVICOVAGINAL ANCILLARY ONLY
Bacterial Vaginitis (gardnerella): POSITIVE — AB
Candida Glabrata: NEGATIVE
Candida Vaginitis: NEGATIVE
Chlamydia: NEGATIVE
Comment: NEGATIVE
Comment: NEGATIVE
Comment: NEGATIVE
Comment: NEGATIVE
Comment: NEGATIVE
Comment: NORMAL
Neisseria Gonorrhea: NEGATIVE
Trichomonas: NEGATIVE

## 2022-10-11 LAB — URINE CULTURE

## 2022-10-13 ENCOUNTER — Telehealth (HOSPITAL_COMMUNITY): Payer: Self-pay | Admitting: Emergency Medicine

## 2022-10-13 MED ORDER — METRONIDAZOLE 500 MG PO TABS
500.0000 mg | ORAL_TABLET | Freq: Two times a day (BID) | ORAL | 0 refills | Status: DC
Start: 2022-10-13 — End: 2022-11-16

## 2022-10-13 MED ORDER — METRONIDAZOLE 500 MG PO TABS
500.0000 mg | ORAL_TABLET | Freq: Two times a day (BID) | ORAL | 0 refills | Status: DC
Start: 2022-10-13 — End: 2022-10-13

## 2022-11-07 ENCOUNTER — Ambulatory Visit: Payer: Medicaid Other | Admitting: Obstetrics and Gynecology

## 2022-11-13 ENCOUNTER — Ambulatory Visit: Payer: Medicaid Other | Admitting: Family Medicine

## 2022-11-16 ENCOUNTER — Other Ambulatory Visit (HOSPITAL_COMMUNITY)
Admission: RE | Admit: 2022-11-16 | Discharge: 2022-11-16 | Disposition: A | Discharge status: Home / Self Care | Payer: Medicaid Other | Source: Ambulatory Visit | Attending: Student | Admitting: Student

## 2022-11-16 ENCOUNTER — Ambulatory Visit (INDEPENDENT_AMBULATORY_CARE_PROVIDER_SITE_OTHER): Payer: Medicaid Other | Admitting: Student

## 2022-11-16 ENCOUNTER — Encounter: Payer: Self-pay | Admitting: Student

## 2022-11-16 VITALS — BP 108/73 | HR 68 | Ht 60.0 in | Wt 172.2 lb

## 2022-11-16 DIAGNOSIS — N898 Other specified noninflammatory disorders of vagina: Secondary | ICD-10-CM | POA: Insufficient documentation

## 2022-11-16 DIAGNOSIS — B9689 Other specified bacterial agents as the cause of diseases classified elsewhere: Secondary | ICD-10-CM

## 2022-11-16 DIAGNOSIS — Z30011 Encounter for initial prescription of contraceptive pills: Secondary | ICD-10-CM

## 2022-11-16 DIAGNOSIS — Z113 Encounter for screening for infections with a predominantly sexual mode of transmission: Secondary | ICD-10-CM

## 2022-11-16 DIAGNOSIS — N760 Acute vaginitis: Secondary | ICD-10-CM | POA: Diagnosis not present

## 2022-11-16 DIAGNOSIS — N926 Irregular menstruation, unspecified: Secondary | ICD-10-CM | POA: Diagnosis not present

## 2022-11-16 DIAGNOSIS — B3731 Acute candidiasis of vulva and vagina: Secondary | ICD-10-CM

## 2022-11-16 LAB — POCT URINE PREGNANCY: Preg Test, Ur: NEGATIVE

## 2022-11-16 MED ORDER — METRONIDAZOLE 500 MG PO TABS
500.0000 mg | ORAL_TABLET | Freq: Two times a day (BID) | ORAL | 0 refills | Status: DC
Start: 2022-11-16 — End: 2022-12-18

## 2022-11-16 MED ORDER — LO LOESTRIN FE 1 MG-10 MCG / 10 MCG PO TABS
1.0000 | ORAL_TABLET | Freq: Every day | ORAL | 4 refills | Status: DC
Start: 2022-11-16 — End: 2022-12-25

## 2022-11-16 MED ORDER — FLUCONAZOLE 150 MG PO TABS
150.0000 mg | ORAL_TABLET | ORAL | 1 refills | Status: DC
Start: 2022-11-16 — End: 2022-12-18

## 2022-11-16 NOTE — Progress Notes (Signed)
Pt presents for irregular cycles. Pt reports 2 periods a month and prolonged periods. Pt also reports recurring BV and yeast. Requesting STD testing, considering BC.

## 2022-11-16 NOTE — Progress Notes (Signed)
History:  [name] is a 27 y.o. [id number] who presents to clinic today for concern for irregular cycles/bleeding and recurring vaginitis. Patient states that this has been going on for 7 months and she has tracked her cycle with a phone app. States that her cycles have been ranging from 3-14 days of bleeding and spotting. Spotting has been noted to be a dark red, brownish color. States that she has had two cycles this month. Patient also shares that she had a miscarriage this February and took the Plan B option within the past month. Periods are not painful or heavy. Patient has also been treated for BV and/or Yeast 6 times in the previous 12 months.    The following portions of the patient's history were reviewed and updated as appropriate: allergies, current medications, family history, past medical history, social history, past surgical history and problem list.  Review of Systems:  Review of Systems  Genitourinary:        Vaginal discharge  All other systems reviewed and are negative.    Objective:  Physical Exam BP 108/73   Pulse 68   Ht 5' (1.524 m)   Wt 172 lb 3.2 oz (78.1 kg)   BMI 33.63 kg/m  Physical Exam Vitals and nursing note reviewed.  Constitutional:      Appearance: Normal appearance.  Cardiovascular:     Rate and Rhythm: Normal rate.  Pulmonary:     Effort: Pulmonary effort is normal.  Genitourinary:    Comments: deferred Skin:    General: Skin is warm and dry.  Neurological:     Mental Status: She is alert and oriented to person, place, and time. Mental status is at baseline.  Psychiatric:        Mood and Affect: Mood normal.        Behavior: Behavior normal.        Thought Content: Thought content normal.        Judgment: Judgment normal.    Labs and Imaging No results found for this or any previous visit (from the past 24 hour(s)).  No results found.  Health Maintenance Due  Topic Date Due   HPV VACCINES (1 - 3-dose series) Never done    DTaP/Tdap/Td (1 - Tdap) Never done   COVID-19 Vaccine (1 - 2023-24 season) Never done    Labs, imaging and previous visits in Epic and Care Everywhere reviewed  Assessment & Plan:  1. Irregular periods/menstrual cycles - Discussed the potential for her hormone dysregulation from her history this year. - Discussed that spotting may not be related to cycle and could be a factor of recurring vaginitis - Discussed option for COC to assist with regulating her system - POCT urine pregnancy  2. Encounter for BCP (birth control pills) initial prescription Reviewed initiation of combined-hormonal contraceptives.  Risks and benefits reviewed. Discussed that cycles may need 2-3 pill cycles before achieving her norm on the therapy. Precautions provided regarding nausea, headaches, and abnormal bleeding. Demonstrated proper use with sample packet.  Questions were answered.  Information was given to patient to review.   - LO LOESTRIN FE 1 MG-10 MCG / 10 MCG tablet; Take 1 tablet by mouth daily.  Dispense: 84 tablet; Refill: 4  3. Vaginal discharge 4. Bacterial vaginosis 5. Yeast vaginitis - Discussed the likelihood of cyclic infections due to Flagyl increasing susceptibility to yeast infection. Also counseled that BV can lead to vaginal spotting and may be part of her irregular bleeding she has noted.  Patient would like to complete initial treatment now and assess the need for maintenance therapy after this treatment. Counseled to start with BV treatment first and follow with antifungal treatment. Plan to reassess vaginal symptoms in 2-3 weeks and if repeat is positive, patient made need to start a longer form of therapy. - Cervicovaginal ancillary only( [facility]) - metroNIDAZOLE (FLAGYL) 500 MG tablet; Take 1 tablet (500 mg total) by mouth 2 (two) times daily.  Dispense: 14 tablet; Refill: 0 - fluconazole (DIFLUCAN) 150 MG tablet; Take 1 tablet (150 mg total) by mouth every 3 (three) days. Start this  tablet on last day of taking Flagyl. If symptoms of yeast infection continue after 3 doses, please refill medication and take an additional 3 doses.  Dispense: 3 tablet; Refill: 1  6. Screening examination for STD (sexually transmitted disease) - Patient did not stop at lab for blood work today - HIV antibody (with reflex) - RPR - Hepatitis B Surface AntiGEN - Hepatitis C Antibody   Approximately 25 minutes of total time was spent with this patient on counseling and coordination of care, and the remainder was dedicated to safe review of patient's chart.   No follow-ups on file. TBD.   Corlis Hove, NP 11/16/2022 12:08 PM

## 2022-11-17 LAB — CERVICOVAGINAL ANCILLARY ONLY
Bacterial Vaginitis (gardnerella): NEGATIVE
Candida Glabrata: NEGATIVE
Candida Vaginitis: POSITIVE — AB
Chlamydia: NEGATIVE
Comment: NEGATIVE
Comment: NEGATIVE
Comment: NEGATIVE
Comment: NEGATIVE
Comment: NEGATIVE
Comment: NORMAL
Neisseria Gonorrhea: NEGATIVE
Trichomonas: NEGATIVE

## 2022-12-18 ENCOUNTER — Other Ambulatory Visit: Payer: Self-pay

## 2022-12-18 ENCOUNTER — Ambulatory Visit (HOSPITAL_COMMUNITY)
Admission: EM | Admit: 2022-12-18 | Discharge: 2022-12-18 | Disposition: A | Discharge status: Home / Self Care | Payer: Medicaid Other | Attending: Physician Assistant | Admitting: Physician Assistant

## 2022-12-18 ENCOUNTER — Encounter (HOSPITAL_COMMUNITY): Payer: Self-pay | Admitting: *Deleted

## 2022-12-18 DIAGNOSIS — Z113 Encounter for screening for infections with a predominantly sexual mode of transmission: Secondary | ICD-10-CM | POA: Diagnosis not present

## 2022-12-18 DIAGNOSIS — N926 Irregular menstruation, unspecified: Secondary | ICD-10-CM

## 2022-12-18 LAB — HIV ANTIBODY (ROUTINE TESTING W REFLEX): HIV Screen 4th Generation wRfx: NONREACTIVE

## 2022-12-18 LAB — HCG, QUANTITATIVE, PREGNANCY: hCG, Beta Chain, Quant, S: 183 m[IU]/mL — ABNORMAL HIGH (ref <5)

## 2022-12-18 NOTE — ED Provider Notes (Signed)
[facility]    CSN: [id number] Arrival date & time: 12/18/22  1830      History   Chief Complaint Chief Complaint  Patient presents with   SEXUALLY TRANSMITTED DISEASE   Possible Pregnancy    HPI Jamie Neal is a 27 y.o. female.   Patient here today for evaluation of missed period.  She reports that her pregnancy test has been negative at home and she would like blood work if possible.  She would also like STD screening.  She denies any current symptoms.  She has not had any known exposure.  The history is provided by the patient.  Possible Pregnancy Pertinent negatives include no abdominal pain and no shortness of breath.    Past Medical History:  Diagnosis Date   BV (bacterial vaginosis)    UTI (urinary tract infection)     Patient Active Problem List   Diagnosis Date Noted   Herpes infection 02/25/2020   Obesity (BMI 30.0-34.9) 08/18/2019    Past Surgical History:  Procedure Laterality Date   CESAREAN SECTION  03/12/2020   Procedure: CESAREAN SECTION;  Surgeon: Warden Fillers, MD;  Location: [facility];  Service: Obstetrics;;    OB History     Gravida  3   Para  2   Term  2   Preterm      AB      Living  2      SAB      IAB      Ectopic      Multiple  0   Live Births  2            Home Medications    Prior to Admission medications   Medication Sig Start Date End Date Taking? Authorizing Provider  LO LOESTRIN FE 1 MG-10 MCG / 10 MCG tablet Take 1 tablet by mouth daily. 11/16/22  Yes Corlis Hove, NP    Family History Family History  Problem Relation Age of Onset   Healthy Mother    Diabetes Father    Hypertension Father     Social History Social History   Tobacco Use   Smoking status: Never   Smokeless tobacco: Never  Vaping Use   Vaping status: Former  Substance Use Topics   Alcohol use: Not Currently    Comment: occasionally   Drug use: Never     Allergies   Patient has no known  allergies.   Review of Systems Review of Systems  Constitutional:  Negative for chills and fever.  Eyes:  Negative for discharge and redness.  Respiratory:  Negative for shortness of breath.   Gastrointestinal:  Negative for abdominal pain, nausea and vomiting.  Genitourinary:  Positive for menstrual problem. Negative for genital sores and vaginal discharge.     Physical Exam Triage Vital Signs ED Triage Vitals  Encounter Vitals Group     BP 12/18/22 1936 97/63     Systolic BP Percentile --      Diastolic BP Percentile --      Pulse Rate 12/18/22 1936 72     Resp 12/18/22 1936 16     Temp 12/18/22 1936 98.2 F (36.8 C)     Temp src --      SpO2 12/18/22 1936 99 %     Weight --      Height --      Head Circumference --      Peak Flow --      Pain Score 12/18/22  1933 2     Pain Loc --      Pain Education --      Exclude from Growth Chart --    No data found.  Updated Vital Signs BP 97/63   Pulse 72   Temp 98.2 F (36.8 C)   Resp 16   LMP 10/30/2022   SpO2 99%     Physical Exam Vitals and nursing note reviewed.  Constitutional:      General: She is not in acute distress.    Appearance: Normal appearance. She is not ill-appearing.  HENT:     Head: Normocephalic and atraumatic.  Eyes:     Conjunctiva/sclera: Conjunctivae normal.  Cardiovascular:     Rate and Rhythm: Normal rate.  Pulmonary:     Effort: Pulmonary effort is normal. No respiratory distress.  Neurological:     Mental Status: She is alert.  Psychiatric:        Mood and Affect: Mood normal.        Behavior: Behavior normal.        Thought Content: Thought content normal.      UC Treatments / Results  Labs (all labs ordered are listed, but only abnormal results are displayed) Labs Reviewed  HIV ANTIBODY (ROUTINE TESTING W REFLEX)  RPR  HCG, QUANTITATIVE, PREGNANCY  CERVICOVAGINAL ANCILLARY ONLY    EKG   Radiology No results found.  Procedures Procedures (including critical  care time)  Medications Ordered in UC Medications - No data to display  Initial Impression / Assessment and Plan / UC Course  I have reviewed the triage vital signs and the nursing notes.  Pertinent labs & imaging results that were available during my care of the patient were reviewed by me and considered in my medical decision making (see chart for details).    hCG quantitative screening ordered as well as STD screening.  Will await results further recommendation.  Encouraged follow-up with any further concerns.  Final Clinical Impressions(s) / UC Diagnoses   Final diagnoses:  Missed period  Screening for STD (sexually transmitted disease)   Discharge Instructions   None    ED Prescriptions   None    PDMP not reviewed this encounter.   Tomi Bamberger, PA-C 12/18/22 2003

## 2022-12-18 NOTE — ED Triage Notes (Signed)
Pt request a blood pregnancy test because the home pregnancy test have been neg. Pt is 2 weeks late with her cycle. Pt also wants STD test thinks she has BV.

## 2022-12-19 LAB — CERVICOVAGINAL ANCILLARY ONLY
Bacterial Vaginitis (gardnerella): NEGATIVE
Candida Glabrata: NEGATIVE
Candida Vaginitis: NEGATIVE
Chlamydia: NEGATIVE
Comment: NEGATIVE
Comment: NEGATIVE
Comment: NEGATIVE
Comment: NEGATIVE
Comment: NEGATIVE
Comment: NORMAL
Neisseria Gonorrhea: NEGATIVE
Trichomonas: NEGATIVE

## 2022-12-19 LAB — RPR: RPR Ser Ql: NONREACTIVE

## 2022-12-21 ENCOUNTER — Ambulatory Visit (HOSPITAL_COMMUNITY)
Admission: RE | Admit: 2022-12-21 | Discharge: 2022-12-21 | Disposition: A | Discharge status: Home / Self Care | Payer: Medicaid Other | Source: Ambulatory Visit

## 2022-12-21 ENCOUNTER — Encounter (HOSPITAL_COMMUNITY): Payer: Self-pay

## 2022-12-21 ENCOUNTER — Other Ambulatory Visit: Payer: Medicaid Other

## 2022-12-21 VITALS — BP 98/63 | HR 87 | Temp 98.3°F | Resp 18

## 2022-12-21 DIAGNOSIS — N926 Irregular menstruation, unspecified: Secondary | ICD-10-CM | POA: Insufficient documentation

## 2022-12-21 DIAGNOSIS — R7989 Other specified abnormal findings of blood chemistry: Secondary | ICD-10-CM

## 2022-12-21 HISTORY — DX: Irregular menstruation, unspecified: N92.6

## 2022-12-21 NOTE — ED Triage Notes (Signed)
Pt has had a positive HCG level and would like repeat she does have a visit with her OBGYN and she has a my chart message to have repeat BCG with their office but came here instead.

## 2022-12-21 NOTE — ED Provider Notes (Signed)
Patient here for repeat HCG testing. She has an appointment scheduled with OB/GYN but decided to come to urgent care for testing instead. States she "parties a lot for her job and wants to be sure she's pregnant". Unfortunately, we do not trend HCGs at urgent care. Advised to go to OB/GYN for lab work appointment as dicussed in my chart message 2 days ago on 12/19/22.  She is agreeable with plan. Billed LOS no charge.    Reita May [place], [place] 12/21/22 [id number]

## 2022-12-22 LAB — BETA HCG QUANT (REF LAB): hCG Quant: 718 m[IU]/mL

## 2022-12-25 ENCOUNTER — Ambulatory Visit (HOSPITAL_COMMUNITY)
Admission: EM | Admit: 2022-12-25 | Discharge: 2022-12-25 | Disposition: A | Discharge status: Home / Self Care | Payer: Medicaid Other | Attending: Family Medicine | Admitting: Family Medicine

## 2022-12-25 ENCOUNTER — Ambulatory Visit: Payer: Medicaid Other

## 2022-12-25 ENCOUNTER — Encounter (HOSPITAL_COMMUNITY): Payer: Self-pay

## 2022-12-25 DIAGNOSIS — Z3201 Encounter for pregnancy test, result positive: Secondary | ICD-10-CM | POA: Diagnosis not present

## 2022-12-25 LAB — POCT URINE PREGNANCY: Preg Test, Ur: POSITIVE — AB

## 2022-12-25 MED ORDER — TRINATAL RX 1 60-1 MG PO TABS: 1.0000 | ORAL_TABLET | Freq: Every day | ORAL | 2 refills | Status: DC

## 2022-12-25 NOTE — ED Triage Notes (Signed)
Pt presents to UC w/ c/o cramping and fatigue x1 week. Pt states she misseed a period.

## 2022-12-27 NOTE — ED Provider Notes (Signed)
[facility] CARE CENTER   [id number] 12/25/22 Arrival Time: 2130  ASSESSMENT & PLAN:  1. Positive pregnancy test     Results for orders placed or performed during the hospital encounter of 12/25/22  POCT urine pregnancy  Result Value Ref Range   Preg Test, Ur Positive (A) Negative     Follow-up Information     Schedule an appointment as soon as possible for a visit  with [facility].   Specialty: Obstetrics and Gynecology Contact information: [address] [place] [phone]        Go to  [facility] (MAU).   Why: If you experience any worsening cramping or abdominal pain. Contact information: [address] [place],  [place]  [address]               Meds ordered this encounter  Medications   multivitamin prental (TRINATAL) 60-1 MG TABS tablet    Sig: Take 1 tablet by mouth daily.    Dispense:  30 tablet    Refill:  2   To schedule OB. Benign exam.  Reviewed expectations re: course of current medical issues. Questions answered. Outlined signs and symptoms indicating need for more acute intervention. Patient verbalized understanding. After Visit Summary given.   SUBJECTIVE:  Jamie Neal is a 27 y.o. female who requests preg test. Patient's last menstrual period was 10/30/2022 (exact date). No specific symptoms except for fatigue.   OBJECTIVE:  Vitals:   12/25/22 0958  BP: (!) 96/54  Pulse: 73  Resp: 16  Temp: 98.1 F (36.7 C)  TempSrc: Oral  SpO2: 98%     General appearance: alert, cooperative, appears stated age and no distress Lungs: unlabored respirations; speaks full sentences without difficulty Back: no CVA tenderness; FROM at waist Abdomen: soft, non-tender GU: deferred Skin: warm and dry Psychological: alert and cooperative; normal mood and affect.  Results for orders placed or performed during the  hospital encounter of 12/25/22  POCT urine pregnancy  Result Value Ref Range   Preg Test, Ur Positive (A) Negative    Labs Reviewed  POCT URINE PREGNANCY - Abnormal; Notable for the following components:      Result Value   Preg Test, Ur Positive (*)    All other components within normal limits    No Known Allergies  Past Medical History:  Diagnosis Date   BV (bacterial vaginosis)    UTI (urinary tract infection)    Family History  Problem Relation Age of Onset   Healthy Mother    Diabetes Father    Hypertension Father    Social History   Socioeconomic History   Marital status: Single    Spouse name: Not on file   Number of children: Not on file   Years of education: Not on file   Highest education level: Not on file  Occupational History   Not on file  Tobacco Use   Smoking status: Never   Smokeless tobacco: Never  Vaping Use   Vaping status: Former  Substance and Sexual Activity   Alcohol use: Not Currently    Comment: occasionally   Drug use: Never   Sexual activity: Yes    Birth control/protection: None  Other Topics Concern   Not on file  Social History Narrative   Not on file   Social Determinants of Health   Financial Resource Strain: Not on file  Food Insecurity: No Food Insecurity (  03/03/2020)   Hunger Vital Sign    Worried About Running Out of Food in the Last Year: Never true    Ran Out of Food in the Last Year: Never true  Transportation Needs: No Transportation Needs (03/03/2020)   PRAPARE - Administrator, Civil Service (Medical): No    Lack of Transportation (Non-Medical): No  Physical Activity: Not on file  Stress: Not on file  Social Connections: Not on file  Intimate Partner Violence: Not on file           Mardella Layman, MD 12/27/22 [phone]

## 2022-12-29 ENCOUNTER — Ambulatory Visit: Payer: Medicaid Other

## 2023-01-01 ENCOUNTER — Encounter (HOSPITAL_COMMUNITY): Payer: Self-pay

## 2023-01-01 ENCOUNTER — Ambulatory Visit (HOSPITAL_COMMUNITY)
Admission: EM | Admit: 2023-01-01 | Discharge: 2023-01-01 | Disposition: A | Discharge status: Home / Self Care | Payer: Medicaid Other | Attending: Nurse Practitioner | Admitting: Nurse Practitioner

## 2023-01-01 DIAGNOSIS — N898 Other specified noninflammatory disorders of vagina: Secondary | ICD-10-CM

## 2023-01-01 DIAGNOSIS — Z8619 Personal history of other infectious and parasitic diseases: Secondary | ICD-10-CM

## 2023-01-01 MED ORDER — VALACYCLOVIR HCL 500 MG PO TABS: 500.0000 mg | ORAL_TABLET | Freq: Two times a day (BID) | ORAL | 0 refills | Status: AC

## 2023-01-01 NOTE — Discharge Instructions (Signed)
Take the Valtrex 500 mg twice daily for 3 days to treat the current flare.  If another flare develops, restart the Valtrex at first sign of onset.  Follow-up with OB/GYN as planned, continue prenatal vitamin.

## 2023-01-01 NOTE — ED Triage Notes (Signed)
Patient here today with c/o vaginal itching, rash, bumps X 2 days. Patient has a h/o herpes 5 years ago.

## 2023-01-01 NOTE — ED Provider Notes (Signed)
[facility]    CSN: [id number] Arrival date & time: 01/01/23  1812      History   Chief Complaint Chief Complaint  Patient presents with   Vaginal Itching    HPI Jamie Neal is a 27 y.o. female.   Patient presents today with 2-day history of vaginal bumps, rash, irritation, and itching.  Reports history of herpes virus, was first diagnosed in 2014 and has had 1 or 2 flares since then.  She reports she is about [redacted] weeks pregnant and denies any vaginal bleeding, vaginal discharge, or vaginal itching.  No dysuria or urinary urgency.  Reports she is urinating more frequently because of the pregnancy.  No nausea or vomiting, fever, pelvic pain, or swelling in the groin.  She is requesting Valtrex today, reports she took this before when she was pregnant and tolerated it well.  Is currently taking prenatal vitamin, otherwise no medications.    Past Medical History:  Diagnosis Date   BV (bacterial vaginosis)    UTI (urinary tract infection)     Patient Active Problem List   Diagnosis Date Noted   Missed period 12/21/2022   Herpes infection 02/25/2020   Obesity (BMI 30.0-34.9) 08/18/2019    Past Surgical History:  Procedure Laterality Date   CESAREAN SECTION  03/12/2020   Procedure: CESAREAN SECTION;  Surgeon: Warden Fillers, MD;  Location: [facility] LD ORS;  Service: Obstetrics;;    OB History     Gravida  5   Para  2   Term  2   Preterm      AB      Living  2      SAB      IAB      Ectopic      Multiple  0   Live Births  2            Home Medications    Prior to Admission medications   Medication Sig Start Date End Date Taking? Authorizing Provider  valACYclovir (VALTREX) 500 MG tablet Take 1 tablet (500 mg total) by mouth 2 (two) times daily for 3 days. Take 1 tablet twice daily for 3 days to treat the current flare.  Restart and take as prescribed if another flare develops. 01/01/23 01/04/23 Yes Valentino Nose, NP  multivitamin  prental (TRINATAL) 60-1 MG TABS tablet Take 1 tablet by mouth daily. 12/25/22   Mardella Layman, MD    Family History Family History  Problem Relation Age of Onset   Healthy Mother    Diabetes Father    Hypertension Father     Social History Social History   Tobacco Use   Smoking status: Never   Smokeless tobacco: Never  Vaping Use   Vaping status: Former  Substance Use Topics   Alcohol use: Not Currently    Comment: occasionally   Drug use: Never     Allergies   Patient has no known allergies.   Review of Systems Review of Systems Per HPI  Physical Exam Triage Vital Signs ED Triage Vitals  Encounter Vitals Group     BP 01/01/23 1833 99/67     Systolic BP Percentile --      Diastolic BP Percentile --      Pulse Rate 01/01/23 1833 84     Resp 01/01/23 1833 16     Temp 01/01/23 1833 98.9 F (37.2 C)     Temp Source 01/01/23 1833 Oral     SpO2 01/01/23 1833 98 %  Weight 01/01/23 1832 176 lb (79.8 kg)     Height 01/01/23 1832 5' (1.524 m)     Head Circumference --      Peak Flow --      Pain Score 01/01/23 1832 7     Pain Loc --      Pain Education --      Exclude from Growth Chart --    No data found.  Updated Vital Signs BP 99/67 (BP Location: Right Arm)   Pulse 84   Temp 98.9 F (37.2 C) (Oral)   Resp 16   Ht 5' (1.524 m)   Wt 176 lb (79.8 kg)   LMP 10/30/2022 (Exact Date)   SpO2 98%   Breastfeeding No   BMI 34.37 kg/m   Visual Acuity Right Eye Distance:   Left Eye Distance:   Bilateral Distance:    Right Eye Near:   Left Eye Near:    Bilateral Near:     Physical Exam Vitals and nursing note reviewed.  Constitutional:      General: She is not in acute distress.    Appearance: Normal appearance. She is not toxic-appearing.  Genitourinary:    Comments: Deferred-patient states has history of HSV and declines retesting; shows me a picture on her cell phone of circular, erythematous, ulcerated lesions on the labia Skin:    General:  Skin is warm and dry.     Coloration: Skin is not jaundiced or pale.     Findings: No erythema.  Neurological:     Mental Status: She is alert and oriented to person, place, and time.     Motor: No weakness.     Gait: Gait normal.  Psychiatric:        Mood and Affect: Mood normal.        Behavior: Behavior is cooperative.      UC Treatments / Results  Labs (all labs ordered are listed, but only abnormal results are displayed) Labs Reviewed - No data to display  EKG   Radiology No results found.  Procedures Procedures (including critical care time)  Medications Ordered in UC Medications - No data to display  Initial Impression / Assessment and Plan / UC Course  I have reviewed the triage vital signs and the nursing notes.  Pertinent labs & imaging results that were available during my care of the patient were reviewed by me and considered in my medical decision making (see chart for details).   Patient is well-appearing, normotensive, afebrile, not tachycardic, not tachypneic, oxygenating well on room air.    1. Vaginal lesion 2. History of herpes genitalis Start Valtrex 500 mg twice daily for 3 days Recommended restarting if symptoms recur Follow-up with OB/GYN as planned, continue prenatal vitamin in the meantime  The patient was given the opportunity to ask questions.  All questions answered to their satisfaction.  The patient is in agreement to this plan.   Final Clinical Impressions(s) / UC Diagnoses   Final diagnoses:  Vaginal lesion  History of herpes genitalis     Discharge Instructions      Take the Valtrex 500 mg twice daily for 3 days to treat the current flare.  If another flare develops, restart the Valtrex at first sign of onset.  Follow-up with OB/GYN as planned, continue prenatal vitamin.    ED Prescriptions     Medication Sig Dispense Auth. Provider   valACYclovir (VALTREX) 500 MG tablet Take 1 tablet (500 mg total) by mouth 2 (two) times  daily for 3 days. Take 1 tablet twice daily for 3 days to treat the current flare.  Restart and take as prescribed if another flare develops. 30 tablet Valentino Nose, NP      PDMP not reviewed this encounter.   Valentino Nose, NP 01/01/23 [phone]

## 2023-01-07 ENCOUNTER — Emergency Department (HOSPITAL_COMMUNITY)
Admission: EM | Admit: 2023-01-07 | Discharge: 2023-01-07 | Disposition: A | Discharge status: Home / Self Care | Payer: Medicaid Other | Attending: Emergency Medicine | Admitting: Emergency Medicine

## 2023-01-07 ENCOUNTER — Other Ambulatory Visit: Payer: Self-pay

## 2023-01-07 ENCOUNTER — Emergency Department (HOSPITAL_COMMUNITY): Payer: Medicaid Other

## 2023-01-07 ENCOUNTER — Encounter (HOSPITAL_COMMUNITY): Payer: Self-pay

## 2023-01-07 DIAGNOSIS — O9A211 Injury, poisoning and certain other consequences of external causes complicating pregnancy, first trimester: Secondary | ICD-10-CM | POA: Insufficient documentation

## 2023-01-07 DIAGNOSIS — S93402A Sprain of unspecified ligament of left ankle, initial encounter: Secondary | ICD-10-CM | POA: Diagnosis not present

## 2023-01-07 DIAGNOSIS — Z3A01 Less than 8 weeks gestation of pregnancy: Secondary | ICD-10-CM | POA: Insufficient documentation

## 2023-01-07 DIAGNOSIS — Y92410 Unspecified street and highway as the place of occurrence of the external cause: Secondary | ICD-10-CM | POA: Diagnosis not present

## 2023-01-07 DIAGNOSIS — O26891 Other specified pregnancy related conditions, first trimester: Secondary | ICD-10-CM | POA: Diagnosis present
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
## 2023-01-08 ENCOUNTER — Encounter: Payer: Self-pay | Admitting: *Deleted

## 2023-01-08 ENCOUNTER — Other Ambulatory Visit: Payer: Self-pay | Admitting: *Deleted

## 2023-01-31 ENCOUNTER — Telehealth: Payer: Self-pay | Admitting: *Deleted

## 2023-01-31 NOTE — Telephone Encounter (Signed)
TC regarding OB Intake/ [place] appt scheduled today at 1:10 pm. Pt stated she thought the appt was tomorrow. There is availability on the schedule this afternoon. Pt appt rescheduled to 3:30 pm. Expresses transportation difficulty. Advised of Medicaid transportation resource and of need to call 2 days ahead. Pt states she will get another ride for today.

## 2023-02-07 ENCOUNTER — Other Ambulatory Visit (INDEPENDENT_AMBULATORY_CARE_PROVIDER_SITE_OTHER): Payer: Medicaid Other

## 2023-02-07 ENCOUNTER — Ambulatory Visit: Payer: Medicaid Other

## 2023-02-07 VITALS — BP 104/67 | HR 92 | Wt 166.8 lb

## 2023-02-07 DIAGNOSIS — O0991 Supervision of high risk pregnancy, unspecified, first trimester: Secondary | ICD-10-CM | POA: Diagnosis not present

## 2023-02-07 DIAGNOSIS — Z3A11 11 weeks gestation of pregnancy: Secondary | ICD-10-CM

## 2023-02-07 DIAGNOSIS — O0990 Supervision of high risk pregnancy, unspecified, unspecified trimester: Secondary | ICD-10-CM

## 2023-02-07 DIAGNOSIS — Z1339 Encounter for screening examination for other mental health and behavioral disorders: Secondary | ICD-10-CM | POA: Diagnosis not present

## 2023-02-07 MED ORDER — BLOOD PRESSURE KIT DEVI
1.0000 | 0 refills | Status: DC
Start: 2023-02-07 — End: 2023-04-30

## 2023-02-07 MED ORDER — TRINATAL RX 1 60-1 MG PO TABS
1.0000 | ORAL_TABLET | Freq: Every day | ORAL | 12 refills | Status: DC
Start: 2023-02-07 — End: 2024-03-07

## 2023-02-07 NOTE — Patient Instructions (Signed)
The [facility] [employer] has a partnership with the [employer] to provide prenatal navigation for the most needed resources in our community. In order to see how we can help connect you to these resources we need consent to contact you. Please complete the very short consent using the link below:   English Link: https://[place].tfaforms.net/283?site=16  Spanish Link: https://[place].tfaforms.net/287?site=16  Options for Doula Care in the [place] Area  As you review your birthing options, consider having a birth doula. A doula is trained to provide support before, during and just after you give birth. There are also postpartum doulas that help you adjust to new parenthood.  While doulas do not provide medical care, they do provide emotional, physical and educational support. A few months before your baby arrives, doulas can help answer questions, ease concerns and help you create and support your birthing plan.    Doulas can help reduce your stress and comfort you and your partner. They can help you cope with labor by helping you use breathing techniques, massage, creative labor positioning, essential oils and affirmations.   Studies show that the benefits of having a doula include:   A more positive birth experience  Fewer requests for pain-relief medication  Less likelihood of cesarean section, commonly called a c-section   Doulas are typically hired via a [employer] account planner between you and the doula. We are happy to provide a list of the most active doulas in the area, all of whom are credentialed by [facility] and will not count as a visitor at your birth.  There are several options for no-cost doula care at our hospital, including:  [facility] Volunteer Doula Program [employer] Program A Cure 4 Moms Doula Study (available only at [employer] for Women, [place], [place] and [employer] [facility] offices)  For more information on these programs or to receive  a list of doulas active in our area, please email [email or web address] .com  Our practice his participating in a study that provides no-cost doula care. ACURE4Moms is a study looking at how doula care can reduce birthing disparities for Black and brown birthing people. We like to refer patients as soon as possible, but definitely before 28 weeks so patients can get to know their doula.    A doula is trained to provide support before, during and just after you give birth. While doulas do not provide medical care, they do provide emotional, physical and educational support. Doulas can help reduce your stress and comfort you and your partner. They can help you cope with labor by helping you use breathing techniques, massage, creative labor positioning, essential oils and affirmations.   ACURE4Moms is a research study trying to reduce:   low birthweight babies  emergency department visits & hospitalizations for birthing persons and their babies  depression among birthing people  discrimination in pregnancy-related care ACURE4Moms is trying out 2 programs designed by  people who have given birth. These programs include: 1. Sharing patient data and warning alerts with clinic staff to keep them accountable for their patients' outcomes and providing tools to help them  reduce bias in care. 2. Matching eligible patients with doulas from the  same community as the patients.  If you would like to participate in this study, please visit:   http://carroll-castaneda.info/

## 2023-02-07 NOTE — Progress Notes (Signed)
Error

## 2023-02-07 NOTE — Progress Notes (Signed)
New OB Intake  I connected with Jamie Neal  on 02/07/23 at  3:10 PM EDT by In Person Visit and verified that I am speaking with the correct person using two identifiers. Nurse is located at [facility] and pt is located at [place].  I discussed the limitations, risks, security and privacy concerns of performing an evaluation and management service by telephone and the availability of in person appointments. I also discussed with the patient that there may be a patient responsible charge related to this service. The patient expressed understanding and agreed to proceed.  I explained I am completing New OB Intake today. We discussed EDD of 08/25/2023, by Ultrasound. Pt is [id number]. I reviewed her allergies, medications and Medical/Surgical/OB history.    Patient Active Problem List   Diagnosis Date Noted   Supervision of high risk pregnancy, antepartum 02/07/2023   Missed period 12/21/2022   Herpes infection 02/25/2020   Obesity (BMI 30.0-34.9) 08/18/2019    Concerns addressed today  Delivery Plans Plans to deliver at [facility]. Discussed the nature of our practice with multiple providers including residents and students. Due to the size of the practice, the delivering provider may not be the same as those providing prenatal care.   Patient not a candidate for water birth. Offered upcoming OB visit with CNM to discuss further.  MyChart/Babyscripts MyChart access verified. I explained pt will have some visits in office and some virtually. Babyscripts instructions given and order placed. Patient verifies receipt of registration text/e-mail. Account successfully created and app downloaded.  Blood Pressure Cuff/Weight Scale Blood pressure cuff ordered for patient to pick-up from [employer]. Explained after first prenatal appt pt will check weekly and document in Babyscripts. Patient does not have weight scale; patient may purchase if they desire to track weight weekly in  Babyscripts.  Anatomy US Explained first scheduled [place] will be around 19 weeks. Anatomy US scheduled for TBD at TBD.  Interested in [place]? If yes, send referral and doula dot phrase.   Is patient a candidate for Babyscripts Optimization? No - High Risk  First visit review I reviewed new OB appt with patient. Explained pt will be seen by Dr. Donavan Foil at first visit. Discussed Avelina Laine genetic screening with patient. Requests Panorama. Routine prenatal labs  collected at today's visit.    Last Pap Diagnosis  Date Value Ref Range Status  01/28/2021   Final   - Negative for Intraepithelial Lesions or Malignancy (NILM)  01/28/2021 - Benign reactive/reparative changes  Final    Harrel Lemon, RN 02/07/2023  4:39 PM

## 2023-02-08 LAB — CBC/D/PLT+RPR+RH+ABO+RUBIGG...
Antibody Screen: NEGATIVE
Basophils Absolute: 0 10*3/uL (ref 0.0–0.2)
Basos: 0 %
EOS (ABSOLUTE): 0.1 10*3/uL (ref 0.0–0.4)
Eos: 1 %
HCV Ab: NONREACTIVE
HIV Screen 4th Generation wRfx: NONREACTIVE
Hematocrit: 37.6 % (ref 34.0–46.6)
Hemoglobin: 12.5 g/dL (ref 11.1–15.9)
Hepatitis B Surface Ag: NEGATIVE
Immature Grans (Abs): 0 10*3/uL (ref 0.0–0.1)
Immature Granulocytes: 0 %
Lymphocytes Absolute: 1.7 10*3/uL (ref 0.7–3.1)
Lymphs: 24 %
MCH: 30.9 pg (ref 26.6–33.0)
MCHC: 33.2 g/dL (ref 31.5–35.7)
MCV: 93 fL (ref 79–97)
Monocytes Absolute: 0.5 10*3/uL (ref 0.1–0.9)
Monocytes: 7 %
Neutrophils Absolute: 4.7 10*3/uL (ref 1.4–7.0)
Neutrophils: 68 %
Platelets: 189 10*3/uL (ref 150–450)
RBC: 4.05 x10E6/uL (ref 3.77–5.28)
RDW: 14.2 % (ref 11.7–15.4)
RPR Ser Ql: NONREACTIVE
Rh Factor: POSITIVE
Rubella Antibodies, IGG: 1.65 {index} (ref >0.99)
WBC: 6.9 10*3/uL (ref 3.4–10.8)

## 2023-02-08 LAB — HCV INTERPRETATION

## 2023-02-09 LAB — CULTURE, OB URINE

## 2023-02-09 LAB — URINE CULTURE, OB REFLEX: Organism ID, Bacteria: NO GROWTH

## 2023-02-14 ENCOUNTER — Encounter (HOSPITAL_COMMUNITY): Payer: Self-pay

## 2023-02-14 ENCOUNTER — Ambulatory Visit (HOSPITAL_COMMUNITY)
Admission: EM | Admit: 2023-02-14 | Discharge: 2023-02-14 | Disposition: A | Discharge status: Home / Self Care | Payer: Medicaid Other | Attending: Internal Medicine | Admitting: Internal Medicine

## 2023-02-14 DIAGNOSIS — Z3A12 12 weeks gestation of pregnancy: Secondary | ICD-10-CM | POA: Insufficient documentation

## 2023-02-14 DIAGNOSIS — N898 Other specified noninflammatory disorders of vagina: Secondary | ICD-10-CM | POA: Diagnosis not present

## 2023-02-14 NOTE — ED Provider Notes (Signed)
[facility]    CSN: [id number] Arrival date & time: 02/14/23  1112      History   Chief Complaint Chief Complaint  Patient presents with   Exposure to STD    HPI Jamie Neal is a 27 y.o. female.   Jamie Neal is a 27 y.o. female presenting for chief complaint of vaginal discharge that has been present for the last few days.  Vaginal discharge is white and with foul odor.  She reports a little bit of itching as well.  She is currently [redacted] weeks pregnant.  Reports recent new female sexual partner with protection, would like testing for STDs as well as BV and yeast.  Denies known exposure to STD.  No new nausea or vomiting.  Denies abdominal pain, urinary symptoms, fever/chills, and bodyaches.  Has not attempted use of any over-the-counter medications to help with symptoms PTA.  Reports frequent history of bacterial vaginitis that was treated last month with Flagyl.  No recent antibiotic or steroid use reported.   Exposure to STD    Past Medical History:  Diagnosis Date   BV (bacterial vaginosis)    Medical history non-contributory    UTI (urinary tract infection)     Patient Active Problem List   Diagnosis Date Noted   Supervision of high risk pregnancy, antepartum 02/07/2023   Missed period 12/21/2022   Herpes infection 02/25/2020   Obesity (BMI 30.0-34.9) 08/18/2019    Past Surgical History:  Procedure Laterality Date   CESAREAN SECTION  03/12/2020   Procedure: CESAREAN SECTION;  Surgeon: Warden Fillers, MD;  Location: [facility];  Service: Obstetrics;;    OB History     Gravida  4   Para  2   Term  2   Preterm      AB  1   Living  2      SAB  1   IAB  0   Ectopic  0   Multiple  0   Live Births  2            Home Medications    Prior to Admission medications   Medication Sig Start Date End Date Taking? Authorizing Provider  Blood Pressure Monitoring (BLOOD PRESSURE KIT) DEVI 1 Device by Does not apply route once a  week. 02/07/23   Hermina Staggers, MD  multivitamin prental (TRINATAL) 60-1 MG TABS tablet Take 1 tablet by mouth daily. 02/07/23   Hermina Staggers, MD    Family History Family History  Problem Relation Age of Onset   Healthy Mother    Diabetes Father    Hypertension Father     Social History Social History   Tobacco Use   Smoking status: Never   Smokeless tobacco: Never  Vaping Use   Vaping status: Former  Substance Use Topics   Alcohol use: Not Currently    Comment: occasionally   Drug use: Never     Allergies   Bee venom   Review of Systems Review of Systems Per HPI  Physical Exam Triage Vital Signs ED Triage Vitals [02/14/23 1219]  Encounter Vitals Group     BP 115/75     Systolic BP Percentile      Diastolic BP Percentile      Pulse Rate 86     Resp 16     Temp 98.8 F (37.1 C)     Temp Source Oral     SpO2 97 %     Weight  Height      Head Circumference      Peak Flow      Pain Score 0     Pain Loc      Pain Education      Exclude from Growth Chart    No data found.  Updated Vital Signs BP 115/75 (BP Location: Right Arm)   Pulse 86   Temp 98.8 F (37.1 C) (Oral)   Resp 16   LMP 10/30/2022 (Exact Date)   SpO2 97%   Visual Acuity Right Eye Distance:   Left Eye Distance:   Bilateral Distance:    Right Eye Near:   Left Eye Near:    Bilateral Near:     Physical Exam Vitals and nursing note reviewed.  Constitutional:      Appearance: She is not ill-appearing or toxic-appearing.  HENT:     Head: Normocephalic and atraumatic.     Right Ear: Hearing and external ear normal.     Left Ear: Hearing and external ear normal.     Nose: Nose normal.     Mouth/Throat:     Lips: Pink.  Eyes:     General: Lids are normal. Vision grossly intact. Gaze aligned appropriately.     Extraocular Movements: Extraocular movements intact.     Conjunctiva/sclera: Conjunctivae normal.  Cardiovascular:     Rate and Rhythm: Normal rate and regular  rhythm.     Heart sounds: Normal heart sounds, S1 normal and S2 normal.  Pulmonary:     Effort: Pulmonary effort is normal. No respiratory distress.     Breath sounds: Normal breath sounds and air entry.  Musculoskeletal:     Cervical back: Neck supple.  Skin:    General: Skin is warm and dry.     Capillary Refill: Capillary refill takes less than 2 seconds.     Findings: No rash.  Neurological:     General: No focal deficit present.     Mental Status: She is alert and oriented to person, place, and time. Mental status is at baseline.     Cranial Nerves: No dysarthria or facial asymmetry.  Psychiatric:        Mood and Affect: Mood normal.        Speech: Speech normal.        Behavior: Behavior normal.        Thought Content: Thought content normal.        Judgment: Judgment normal.      UC Treatments / Results  Labs (all labs ordered are listed, but only abnormal results are displayed) Labs Reviewed  CERVICOVAGINAL ANCILLARY ONLY    EKG   Radiology No results found.  Procedures Procedures (including critical care time)  Medications Ordered in UC Medications - No data to display  Initial Impression / Assessment and Plan / UC Course  I have reviewed the triage vital signs and the nursing notes.  Pertinent labs & imaging results that were available during my care of the patient were reviewed by me and considered in my medical decision making (see chart for details).   1.  Vaginal discharge, [redacted] weeks gestation of pregnancy STI labs pending, will notify patient of positive results and treat accordingly per protocol when labs result.  Patient declines HIV and syphilis testing today.   Patient to avoid sexual intercourse until screening testing comes back.   Education provided regarding safe sexual practices and patient encouraged to use protection to prevent spread of STIs.    Counseled  patient on potential for adverse effects with medications prescribed/recommended  today, strict ER and return-to-clinic precautions discussed, patient verbalized understanding.    Final Clinical Impressions(s) / UC Diagnoses   Final diagnoses:  Vaginal discharge  [redacted] weeks gestation of pregnancy     Discharge Instructions      STD testing pending, this will take 2-3 days to result. We will only call you if your testing is positive for any infection(s) and we will provide treatment.  Avoid sexual intercourse until your STD results come back.  If any of your STD results are positive, you will need to avoid sexual intercourse for 7 days while you are being treated to prevent spread of STD.  Condom use is the best way to prevent spread of STDs. Notify partner(s) of any positive results.  Return to urgent care as needed.      ED Prescriptions   None    PDMP not reviewed this encounter.   Carlisle Beers, [place] 02/14/23 1253

## 2023-02-14 NOTE — Discharge Instructions (Signed)

## 2023-02-14 NOTE — ED Triage Notes (Addendum)
Pt presents to urgent care today for "regular STI/STD testing." Pt also reports she is approximately [redacted] weeks pregnant.

## 2023-02-15 ENCOUNTER — Telehealth (HOSPITAL_COMMUNITY): Payer: Self-pay | Admitting: *Deleted

## 2023-02-15 ENCOUNTER — Telehealth: Payer: Self-pay

## 2023-02-15 LAB — CERVICOVAGINAL ANCILLARY ONLY
Bacterial Vaginitis (gardnerella): POSITIVE — AB
Candida Glabrata: NEGATIVE
Candida Vaginitis: NEGATIVE
Chlamydia: NEGATIVE
Comment: NEGATIVE
Comment: NEGATIVE
Comment: NEGATIVE
Comment: NEGATIVE
Comment: NEGATIVE
Comment: NORMAL
Neisseria Gonorrhea: NEGATIVE
Trichomonas: NEGATIVE

## 2023-02-15 MED ORDER — METRONIDAZOLE 0.75 % VA GEL: 1.0000 | Freq: Every day | VAGINAL | 0 refills | Status: DC

## 2023-02-15 MED ORDER — METRONIDAZOLE 0.75 % VA GEL: 1.0000 | Freq: Every day | VAGINAL | Status: DC

## 2023-02-15 NOTE — Telephone Encounter (Signed)
Pt called states metrogel isnt at pharm, when looking at chart it was put in as  clinic admin so I have sent it to pharm , pt aware

## 2023-02-16 LAB — PANORAMA PRENATAL TEST FULL PANEL:PANORAMA TEST PLUS 5 ADDITIONAL MICRODELETIONS: FETAL FRACTION: 8.4

## 2023-02-21 NOTE — Plan of Care (Signed)
[facility] Tonsillectomy/Adenoidectomy, Postoperative PEDS care plan entered in error.

## 2023-02-26 ENCOUNTER — Ambulatory Visit (HOSPITAL_COMMUNITY)
Admission: EM | Admit: 2023-02-26 | Discharge: 2023-02-26 | Disposition: A | Discharge status: Home / Self Care | Payer: Medicaid Other | Attending: Internal Medicine | Admitting: Internal Medicine

## 2023-02-26 ENCOUNTER — Encounter (HOSPITAL_COMMUNITY): Payer: Self-pay

## 2023-02-26 DIAGNOSIS — B9689 Other specified bacterial agents as the cause of diseases classified elsewhere: Secondary | ICD-10-CM | POA: Insufficient documentation

## 2023-02-26 DIAGNOSIS — N898 Other specified noninflammatory disorders of vagina: Secondary | ICD-10-CM | POA: Diagnosis present

## 2023-02-26 DIAGNOSIS — N760 Acute vaginitis: Secondary | ICD-10-CM

## 2023-02-26 LAB — RPR: RPR Ser Ql: NONREACTIVE

## 2023-02-26 LAB — HIV ANTIBODY (ROUTINE TESTING W REFLEX): HIV Screen 4th Generation wRfx: NONREACTIVE

## 2023-02-26 MED ORDER — CLOTRIMAZOLE 1 % VA CREA: 1.0000 | TOPICAL_CREAM | Freq: Every day | VAGINAL | 0 refills | Status: DC

## 2023-02-26 MED ORDER — METRONIDAZOLE 500 MG PO TABS: 500.0000 mg | ORAL_TABLET | Freq: Two times a day (BID) | ORAL | 0 refills | Status: DC

## 2023-02-26 NOTE — Discharge Instructions (Addendum)
 Test results will be released to your MyChart account We will contact you if anything is positive and requires treatment.

## 2023-02-26 NOTE — ED Triage Notes (Addendum)
Patient here today with c/o  vaginal itching, frequent urination, odor, vagainl discharge, discomfort while having intercourse X 1 week. Patient recently prescribed Metrogel which helped but then symptoms returned and have even worsened. Patient had tested positive for BV.

## 2023-02-26 NOTE — ED Provider Notes (Signed)
[facility]    CSN: [id number] Arrival date & time: 02/26/23  2706      History   Chief Complaint Chief Complaint  Patient presents with   SEXUALLY TRANSMITTED DISEASE    HPI Jamie Neal is a 27 y.o. female.   HPI Has a history of frequent BV infections, was treated recently with metronidazole gel states her symptoms never fully resolved now continues to have odor, discharge and now believes she has a yeast infection as well with curdy white discharge and vaginal itching.  She is currently pregnant, issues with this pregnancy.  G4 P2 A1 She is requesting STI testing as she is sexually active with a new partner.  Past Medical History:  Diagnosis Date   BV (bacterial vaginosis)    Medical history non-contributory    UTI (urinary tract infection)     Patient Active Problem List   Diagnosis Date Noted   Supervision of high risk pregnancy, antepartum 02/07/2023   Missed period 12/21/2022   Herpes infection 02/25/2020   Obesity (BMI 30.0-34.9) 08/18/2019    Past Surgical History:  Procedure Laterality Date   CESAREAN SECTION  03/12/2020   Procedure: CESAREAN SECTION;  Surgeon: Warden Fillers, MD;  [facility];  Service: Obstetrics;;    OB History     Gravida  4   Para  2   Term  2   Preterm      AB  1   Living  2      SAB  1   IAB  0   Ectopic  0   Multiple  0   Live Births  2            Home Medications    Prior to Admission medications   Medication Sig Start Date End Date Taking? Authorizing Provider  Blood Pressure Monitoring (BLOOD PRESSURE KIT) DEVI 1 Device by Does not apply route once a week. 02/07/23   Hermina Staggers, MD  metroNIDAZOLE (METROGEL) 0.75 % vaginal gel Place 1 Applicatorful vaginally at bedtime. 02/15/23   LampteyBritta Mccreedy, MD  multivitamin prental (TRINATAL) 60-1 MG TABS tablet Take 1 tablet by mouth daily. 02/07/23   Hermina Staggers, MD    Family History Family History  Problem Relation  Age of Onset   Healthy Mother    Diabetes Father    Hypertension Father     Social History Social History   Tobacco Use   Smoking status: Never   Smokeless tobacco: Never  Vaping Use   Vaping status: Former  Substance Use Topics   Alcohol use: Not Currently    Comment: occasionally   Drug use: Never     Allergies   Bee venom   Review of Systems Review of Systems  Genitourinary:  Positive for vaginal discharge. Negative for pelvic pain, urgency, vaginal bleeding and vaginal pain.  Skin:  Negative for rash.     Physical Exam Triage Vital Signs ED Triage Vitals  Encounter Vitals Group     BP 02/26/23 1032 112/73     Systolic BP Percentile --      Diastolic BP Percentile --      Pulse Rate 02/26/23 1032 89     Resp 02/26/23 1032 16     Temp 02/26/23 1032 98.2 F (36.8 C)     Temp Source 02/26/23 1032 Oral     SpO2 02/26/23 1032 98 %     Weight 02/26/23 1029 174 lb 2.6 oz (79 kg)  Height 02/26/23 1029 5' (1.524 m)     Head Circumference --      Peak Flow --      Pain Score 02/26/23 1029 0     Pain Loc --      Pain Education --      Exclude from Growth Chart --    No data found.  Updated Vital Signs BP 112/73 (BP Location: Right Arm)   Pulse 89   Temp 98.2 F (36.8 C) (Oral)   Resp 16   Ht 5' (1.524 m)   Wt 174 lb 2.6 oz (79 kg)   LMP 10/30/2022 (Exact Date)   SpO2 98%   BMI 34.01 kg/m   Visual Acuity Right Eye Distance:   Left Eye Distance:   Bilateral Distance:    Right Eye Near:   Left Eye Near:    Bilateral Near:     Physical Exam Vitals and nursing note reviewed.  HENT:     Head: Atraumatic.  Cardiovascular:     Rate and Rhythm: Normal rate.     Heart sounds: Normal heart sounds.  Pulmonary:     Effort: Pulmonary effort is normal.     Breath sounds: Normal breath sounds.  Abdominal:     Tenderness: There is no abdominal tenderness.     Comments: Gravida abdomen  Neurological:     Mental Status: She is alert and oriented to  person, place, and time.      UC Treatments / Results  Labs (all labs ordered are listed, but only abnormal results are displayed) Labs Reviewed  HIV ANTIBODY (ROUTINE TESTING W REFLEX)  RPR  CERVICOVAGINAL ANCILLARY ONLY    EKG   Radiology No results found.  Procedures Procedures (including critical care time)  Medications Ordered in UC Medications - No data to display  Initial Impression / Assessment and Plan / UC Course  I have reviewed the triage vital signs and the nursing notes.  Pertinent labs & imaging results that were available during my care of the patient were reviewed by me and considered in my medical decision making (see chart for details).     27 year old pregnant female with recurrent BV infections here due to persistent symptoms despite recent treatment and STI testing. Well-appearing, vital signs stable, will Rx oral Flagyl and intravaginal clotrimazole. Final Clinical Impressions(s) / UC Diagnoses   Final diagnoses:  None   Discharge Instructions   None    ED Prescriptions   None    PDMP not reviewed this encounter.   Meliton Rattan, [place] 02/26/23 1055

## 2023-02-27 ENCOUNTER — Ambulatory Visit: Payer: Medicaid Other

## 2023-02-27 LAB — CERVICOVAGINAL ANCILLARY ONLY
Bacterial Vaginitis (gardnerella): NEGATIVE
Candida Glabrata: NEGATIVE
Candida Vaginitis: POSITIVE — AB
Chlamydia: NEGATIVE
Comment: NEGATIVE
Comment: NEGATIVE
Comment: NEGATIVE
Comment: NEGATIVE
Comment: NEGATIVE
Comment: NORMAL
Neisseria Gonorrhea: NEGATIVE
Trichomonas: NEGATIVE

## 2023-03-08 ENCOUNTER — Encounter: Payer: Medicaid Other | Admitting: Obstetrics and Gynecology

## 2023-03-20 ENCOUNTER — Encounter: Payer: Self-pay | Admitting: *Deleted

## 2023-03-22 ENCOUNTER — Encounter: Payer: Self-pay | Admitting: Obstetrics

## 2023-03-22 ENCOUNTER — Other Ambulatory Visit (HOSPITAL_COMMUNITY)
Admission: RE | Admit: 2023-03-22 | Discharge: 2023-03-22 | Disposition: A | Discharge status: Home / Self Care | Payer: Medicaid Other | Source: Ambulatory Visit | Attending: Obstetrics | Admitting: Obstetrics

## 2023-03-22 ENCOUNTER — Ambulatory Visit (INDEPENDENT_AMBULATORY_CARE_PROVIDER_SITE_OTHER): Payer: Medicaid Other | Admitting: Obstetrics

## 2023-03-22 VITALS — BP 98/66 | HR 77 | Wt 170.0 lb

## 2023-03-22 DIAGNOSIS — Z3A17 17 weeks gestation of pregnancy: Secondary | ICD-10-CM

## 2023-03-22 DIAGNOSIS — N898 Other specified noninflammatory disorders of vagina: Secondary | ICD-10-CM

## 2023-03-22 DIAGNOSIS — Z3482 Encounter for supervision of other normal pregnancy, second trimester: Secondary | ICD-10-CM

## 2023-03-22 DIAGNOSIS — R309 Painful micturition, unspecified: Secondary | ICD-10-CM

## 2023-03-22 LAB — OB RESULTS CONSOLE GBS: GBS: POSITIVE

## 2023-03-22 LAB — POCT URINALYSIS DIPSTICK
Bilirubin, UA: NEGATIVE
Blood, UA: NEGATIVE
Glucose, UA: NEGATIVE
Ketones, UA: POSITIVE
Leukocytes, UA: NEGATIVE
Nitrite, UA: NEGATIVE
Odor: NEGATIVE
Protein, UA: NEGATIVE
Spec Grav, UA: 1.015 (ref 1.010–1.025)
Urobilinogen, UA: 0.2 U/dL
pH, UA: 6 (ref 5.0–8.0)

## 2023-03-22 NOTE — Progress Notes (Signed)
NOB in office Intake and u/s completed on 02/07/23 Pt reports frequent urination with discomfort and small amounts of urine, advised to leave a urine sample and pt also requests to self swab.

## 2023-03-22 NOTE — Addendum Note (Signed)
Addended by: Natale Milch D on: 03/22/2023 04:06 PM   Modules accepted: Orders

## 2023-03-22 NOTE — Progress Notes (Signed)
Subjective:    Jamie Neal is being seen today for her first obstetrical visit.  This is not a planned pregnancy. She is at [redacted]w[redacted]d gestation. Her obstetrical history is significant for  previous preterm delivery . Relationship with FOB: significant other, living together. Patient does intend to breast feed. Pregnancy history fully reviewed.  The information documented in the HPI was reviewed and verified.  Menstrual History: OB History     Gravida  4   Para  2   Term  2   Preterm      AB  1   Living  2      SAB  1   IAB  0   Ectopic  0   Multiple  0   Live Births  2            Patient's last menstrual period was 10/30/2022 (exact date).    Past Medical History:  Diagnosis Date   BV (bacterial vaginosis)    Medical history non-contributory    Missed period 12/21/2022   UTI (urinary tract infection)     Past Surgical History:  Procedure Laterality Date   CESAREAN SECTION  03/12/2020   Procedure: CESAREAN SECTION;  Surgeon: Warden Fillers, MD;  Location: [facility];  Service: Obstetrics;;    (Not in a hospital admission)  Allergies  Allergen Reactions   Bee Venom     Social History   Tobacco Use   Smoking status: Never   Smokeless tobacco: Never  Substance Use Topics   Alcohol use: Not Currently    Comment: occasionally    Family History  Problem Relation Age of Onset   Healthy Mother    Diabetes Father    Hypertension Father      Review of Systems Constitutional: negative for weight loss Gastrointestinal: negative for vomiting Genitourinary:negative for genital lesions and vaginal discharge and dysuria Musculoskeletal:negative for back pain Behavioral/Psych: negative for abusive relationship, depression, illegal drug usage and tobacco use    Objective:    BP 98/66   Pulse 77   Wt 170 lb (77.1 kg)   LMP 10/30/2022 (Exact Date)   BMI 33.20 kg/m  General Appearance:    Alert, cooperative, no distress, appears stated age  Head:     Normocephalic, without obvious abnormality, atraumatic  Eyes:    PERRL, conjunctiva/corneas clear, EOM's intact, fundi    benign, both eyes  Ears:    Normal TM's and external ear canals, both ears  Nose:   Nares normal, septum midline, mucosa normal, no drainage    or sinus tenderness  Throat:   Lips, mucosa, and tongue normal; teeth and gums normal  Neck:   Supple, symmetrical, trachea midline, no adenopathy;    thyroid:  no enlargement/tenderness/nodules; no carotid   bruit or JVD  Back:     Symmetric, no curvature, ROM normal, no CVA tenderness  Lungs:     Clear to auscultation bilaterally, respirations unlabored  Chest Wall:    No tenderness or deformity   Heart:    Regular rate and rhythm, S1 and S2 normal, no murmur, rub   or gallop  Breast Exam:    No tenderness, masses, or nipple abnormality  Abdomen:     Soft, non-tender, bowel sounds active all four quadrants,    no masses, no organomegaly  Genitalia:    Normal female without lesion, discharge or tenderness  Extremities:   Extremities normal, atraumatic, no cyanosis or edema  Pulses:   2+ and symmetric all  extremities  Skin:   Skin color, texture, turgor normal, no rashes or lesions  Lymph nodes:   Cervical, supraclavicular, and axillary nodes normal  Neurologic:   CNII-XII intact, normal strength, sensation and reflexes    throughout      Lab Review Urine pregnancy test Labs reviewed yes Radiologic studies reviewed no  Assessment:    Pregnancy at [redacted]w[redacted]d weeks    Plan:    1. [redacted] weeks gestation of pregnancy Rx: - AFP, Serum, Open Spina Bifida   Prenatal vitamins.  Counseling provided regarding continued use of seat belts, cessation of alcohol consumption, smoking or use of illicit drugs; infection precautions i.e., influenza/TDAP immunizations, toxoplasmosis,CMV, parvovirus, listeria and varicella; workplace safety, exercise during pregnancy; routine dental care, safe medications, sexual activity, hot tubs, saunas,  pools, travel, caffeine use, fish and methlymercury, potential toxins, hair treatments, varicose veins Weight gain recommendations per IOM guidelines reviewed: underweight/BMI< 18.5--> gain 28 - 40 lbs; normal weight/BMI 18.5 - 24.9--> gain 25 - 35 lbs; overweight/BMI 25 - 29.9--> gain 15 - 25 lbs; obese/BMI >30->gain  11 - 20 lbs Problem list reviewed and updated. FIRST/CF mutation testing/NIPT/QUAD SCREEN/fragile X/Ashkenazi Jewish population testing/Spinal muscular atrophy discussed: requested. Role of ultrasound in pregnancy discussed; fetal survey: requested. Amniocentesis discussed: not indicated. VBAC calculator score: VBAC consent form provided  No orders of the defined types were placed in this encounter.  Orders Placed This Encounter  Procedures   AFP, Serum, Open Spina Bifida    Order Specific Question:   Is patient insulin dependent?    Answer:   No    Order Specific Question:   Weight (lbs)    Answer:   170    Order Specific Question:   Gestational Age (GA), weeks    Answer:   17    Order Specific Question:   Date on which patient was at this GA    Answer:   03/22/2023    Order Specific Question:   GA Calculation Method    Answer:   Ultrasound    Order Specific Question:   Number of fetuses    Answer:   1    Order Specific Question:   Donor egg?    Answer:   N    Follow up in 4 weeks.  I have spent a total of 20 minutes of face-to-face and non-face-to-face time, excluding clinical staff time, reviewing notes and preparing to see patient, ordering tests and/or medications, and counseling the patient.   Brock Bad, MD, FACOG Attending Obstetrician & Gynecologist, [facility], [facility], [place] 03/22/2023

## 2023-03-23 LAB — CERVICOVAGINAL ANCILLARY ONLY
Bacterial Vaginitis (gardnerella): NEGATIVE
Candida Glabrata: NEGATIVE
Candida Vaginitis: POSITIVE — AB
Chlamydia: NEGATIVE
Comment: NEGATIVE
Comment: NEGATIVE
Comment: NEGATIVE
Comment: NEGATIVE
Comment: NEGATIVE
Comment: NORMAL
Neisseria Gonorrhea: NEGATIVE
Trichomonas: NEGATIVE

## 2023-03-24 LAB — AFP, SERUM, OPEN SPINA BIFIDA
AFP MoM: 0.53
AFP Value: 20.4 ng/mL
Gest. Age on Collection Date: 17 wk
Maternal Age At EDD: 27.3 a
OSBR Risk 1 IN: 10000
Test Results:: NEGATIVE
Weight: 170 [lb_av]

## 2023-03-26 LAB — URINE CULTURE, OB REFLEX

## 2023-03-26 LAB — CULTURE, OB URINE

## 2023-03-28 ENCOUNTER — Encounter: Payer: Self-pay | Admitting: Family Medicine

## 2023-03-28 DIAGNOSIS — R8271 Bacteriuria: Secondary | ICD-10-CM | POA: Insufficient documentation

## 2023-04-03 ENCOUNTER — Other Ambulatory Visit: Payer: Self-pay

## 2023-04-03 ENCOUNTER — Ambulatory Visit: Payer: Medicaid Other | Attending: Obstetrics and Gynecology

## 2023-04-03 ENCOUNTER — Encounter: Payer: Self-pay | Admitting: *Deleted

## 2023-04-03 ENCOUNTER — Ambulatory Visit: Payer: Medicaid Other

## 2023-04-03 VITALS — BP 110/65 | HR 78

## 2023-04-03 DIAGNOSIS — O0990 Supervision of high risk pregnancy, unspecified, unspecified trimester: Secondary | ICD-10-CM | POA: Diagnosis present

## 2023-04-03 DIAGNOSIS — O34219 Maternal care for unspecified type scar from previous cesarean delivery: Secondary | ICD-10-CM | POA: Diagnosis not present

## 2023-04-03 DIAGNOSIS — O99212 Obesity complicating pregnancy, second trimester: Secondary | ICD-10-CM | POA: Diagnosis not present

## 2023-04-03 DIAGNOSIS — E669 Obesity, unspecified: Secondary | ICD-10-CM

## 2023-04-03 DIAGNOSIS — O0932 Supervision of pregnancy with insufficient antenatal care, second trimester: Secondary | ICD-10-CM | POA: Diagnosis not present

## 2023-04-03 DIAGNOSIS — Z3A19 19 weeks gestation of pregnancy: Secondary | ICD-10-CM

## 2023-04-03 DIAGNOSIS — R8271 Bacteriuria: Secondary | ICD-10-CM | POA: Insufficient documentation

## 2023-04-08 ENCOUNTER — Ambulatory Visit (HOSPITAL_COMMUNITY)
Admission: EM | Admit: 2023-04-08 | Discharge: 2023-04-08 | Disposition: A | Discharge status: Home / Self Care | Payer: Medicaid Other | Attending: Emergency Medicine | Admitting: Emergency Medicine

## 2023-04-08 ENCOUNTER — Encounter (HOSPITAL_COMMUNITY): Payer: Self-pay | Admitting: Emergency Medicine

## 2023-04-08 DIAGNOSIS — N760 Acute vaginitis: Secondary | ICD-10-CM | POA: Diagnosis not present

## 2023-04-08 DIAGNOSIS — R300 Dysuria: Secondary | ICD-10-CM

## 2023-04-08 LAB — POCT URINALYSIS DIP (MANUAL ENTRY)
Bilirubin, UA: NEGATIVE
Blood, UA: NEGATIVE
Glucose, UA: NEGATIVE mg/dL
Ketones, POC UA: NEGATIVE mg/dL
Nitrite, UA: NEGATIVE
Protein Ur, POC: NEGATIVE mg/dL
Spec Grav, UA: 1.02 (ref 1.010–1.025)
Urobilinogen, UA: 1 U/dL
pH, UA: 7.5 (ref 5.0–8.0)

## 2023-04-08 NOTE — ED Provider Notes (Signed)
[facility]    CSN: [id number] Arrival date & time: 04/08/23  1001      History   Chief Complaint Chief Complaint  Patient presents with   Urinary Frequency    HPI Jamie Neal is a 27 y.o. female.   Patient presents to clinic complaining of vaginal itching and a white discharge.  She is also having urinary frequency, dysuria and abdominal discomfort with urination. No vaginal bleeding or cramping.  No vomiting or diarrhea.  No flank pain or fevers.  She is concerned because she test positive for group beta strep with her OB/GYN.  She has not tried any medications or interventions for her symptoms.  She is currently pregnant.  The history is provided by the patient and medical records.  Urinary Frequency    Past Medical History:  Diagnosis Date   BV (bacterial vaginosis)    Missed period 12/21/2022   UTI (urinary tract infection)     Patient Active Problem List   Diagnosis Date Noted   GBS bacteriuria 03/28/2023   Supervision of high risk pregnancy, antepartum 02/07/2023   Herpes infection 02/25/2020   Obesity (BMI 30.0-34.9) 08/18/2019    Past Surgical History:  Procedure Laterality Date   CESAREAN SECTION  03/12/2020   Procedure: CESAREAN SECTION;  Surgeon: Warden Fillers, MD;  Location: [facility];  Service: Obstetrics;;    OB History     Gravida  4   Para  2   Term  2   Preterm      AB  1   Living  2      SAB  1   IAB  0   Ectopic  0   Multiple  0   Live Births  2            Home Medications    Prior to Admission medications   Medication Sig Start Date End Date Taking? Authorizing Provider  Blood Pressure Monitoring (BLOOD PRESSURE KIT) DEVI 1 Device by Does not apply route once a week. Patient not taking: Reported on 03/22/2023 02/07/23   Hermina Staggers, MD  clotrimazole (GYNE-LOTRIMIN) 1 % vaginal cream Place 1 Applicatorful vaginally at bedtime. Patient not taking: Reported on 03/22/2023 02/26/23    Meliton Rattan, PA  metroNIDAZOLE (FLAGYL) 500 MG tablet Take 1 tablet (500 mg total) by mouth 2 (two) times daily. Patient not taking: Reported on 03/22/2023 02/26/23   Meliton Rattan, PA  multivitamin prental (TRINATAL) 60-1 MG TABS tablet Take 1 tablet by mouth daily. 02/07/23   Hermina Staggers, MD    Family History Family History  Problem Relation Age of Onset   Healthy Mother    Diabetes Father    Hypertension Father     Social History Social History   Tobacco Use   Smoking status: Never   Smokeless tobacco: Never  Vaping Use   Vaping status: Former  Substance Use Topics   Alcohol use: Not Currently    Comment: occasionally   Drug use: Never     Allergies   Bee venom   Review of Systems Review of Systems  Per HPI   Physical Exam Triage Vital Signs ED Triage Vitals  Encounter Vitals Group     BP 04/08/23 1013 104/69     Systolic BP Percentile --      Diastolic BP Percentile --      Pulse Rate 04/08/23 1013 97     Resp 04/08/23 1013 17     Temp 04/08/23  1013 97.8 F (36.6 C)     Temp src --      SpO2 04/08/23 1013 98 %     Weight --      Height --      Head Circumference --      Peak Flow --      Pain Score 04/08/23 1021 5     Pain Loc --      Pain Education --      Exclude from Growth Chart --    No data found.  Updated Vital Signs BP 104/69 (BP Location: Left Arm)   Pulse 97   Temp 97.8 F (36.6 C)   Resp 17   LMP 10/30/2022 (Exact Date)   SpO2 98%   Visual Acuity Right Eye Distance:   Left Eye Distance:   Bilateral Distance:    Right Eye Near:   Left Eye Near:    Bilateral Near:     Physical Exam Vitals and nursing note reviewed.  Constitutional:      Appearance: Normal appearance.  HENT:     Head: Normocephalic and atraumatic.     Right Ear: External ear normal.     Left Ear: External ear normal.     Nose: Nose normal.     Mouth/Throat:     Mouth: Mucous membranes are moist.  Eyes:     Conjunctiva/sclera:  Conjunctivae normal.  Cardiovascular:     Rate and Rhythm: Normal rate.  Pulmonary:     Effort: Pulmonary effort is normal. No respiratory distress.  Abdominal:     Tenderness: There is no right CVA tenderness or left CVA tenderness.  Musculoskeletal:        General: Normal range of motion.  Skin:    General: Skin is warm and dry.  Neurological:     General: No focal deficit present.     Mental Status: She is alert.  Psychiatric:        Mood and Affect: Mood normal.      UC Treatments / Results  Labs (all labs ordered are listed, but only abnormal results are displayed) Labs Reviewed  POCT URINALYSIS DIP (MANUAL ENTRY) - Abnormal; Notable for the following components:      Result Value   Leukocytes, UA Small (1+) (*)    All other components within normal limits  URINE CULTURE  CERVICOVAGINAL ANCILLARY ONLY    EKG   Radiology No results found.  Procedures Procedures (including critical care time)  Medications Ordered in UC Medications - No data to display  Initial Impression / Assessment and Plan / UC Course  I have reviewed the triage vital signs and the nursing notes.  Pertinent labs & imaging results that were available during my care of the patient were reviewed by me and considered in my medical decision making (see chart for details).  Vitals and triage reviewed, patient is hemodynamically stable.  Negative for CVA tenderness, fever, nausea, vomiting or current pain.  Low concern for pyelonephritis.  Urinalysis positive for small leukocytes, will send for culture due to symptoms and since she is high risk due to pregnancy.  Vaginal swab obtained.  Plan of care, follow-up care return precautions given, no questions at this time.    Final Clinical Impressions(s) / UC Diagnoses   Final diagnoses:  Dysuria  Acute vaginitis     Discharge Instructions      You have been tested for yeast, bacterial vaginosis, gonorrhea, trichomoniasis and gonorrhea today.   Our staff will reach  out to you if positive and start you on the appropriate treatment.  Abstain from intercourse until results have been received.  Your urine did not show any obvious signs of infection, are sending off for culture and we will contact you if antibiotics are indicated for this.  Seek immediate care at the maternity assessment unit if you develop abdominal pain, abdominal cramping or vaginal bleeding.      ED Prescriptions   None    PDMP not reviewed this encounter.   Early Ord, [place], [place] 04/08/23 1041

## 2023-04-08 NOTE — Discharge Instructions (Signed)
You have been tested for yeast, bacterial vaginosis, gonorrhea, trichomoniasis and gonorrhea today.  Our staff will reach out to you if positive and start you on the appropriate treatment.  Abstain from intercourse until results have been received.  Your urine did not show any obvious signs of infection, are sending off for culture and we will contact you if antibiotics are indicated for this.  Seek immediate care at the maternity assessment unit if you develop abdominal pain, abdominal cramping or vaginal bleeding.

## 2023-04-08 NOTE — ED Triage Notes (Signed)
Pt c/o vaginal itching and discharge. States she tested positive for yeast about 1 week ago.  Also c/o urinary frequency, burning after urinating, and abdominal pain for 7 days

## 2023-04-09 LAB — URINE CULTURE: Culture: 60000 — AB

## 2023-04-09 LAB — CERVICOVAGINAL ANCILLARY ONLY
Bacterial Vaginitis (gardnerella): NEGATIVE
Candida Glabrata: NEGATIVE
Candida Vaginitis: NEGATIVE
Chlamydia: NEGATIVE
Comment: NEGATIVE
Comment: NEGATIVE
Comment: NEGATIVE
Comment: NEGATIVE
Comment: NEGATIVE
Comment: NORMAL
Neisseria Gonorrhea: NEGATIVE
Trichomonas: NEGATIVE

## 2023-04-19 ENCOUNTER — Encounter: Payer: Medicaid Other | Admitting: Obstetrics and Gynecology

## 2023-04-30 ENCOUNTER — Ambulatory Visit (HOSPITAL_COMMUNITY)
Admission: EM | Admit: 2023-04-30 | Discharge: 2023-04-30 | Disposition: A | Discharge status: Home / Self Care | Payer: Medicaid Other | Attending: Internal Medicine | Admitting: Internal Medicine

## 2023-04-30 ENCOUNTER — Encounter (HOSPITAL_COMMUNITY): Payer: Self-pay | Admitting: Emergency Medicine

## 2023-04-30 DIAGNOSIS — Z113 Encounter for screening for infections with a predominantly sexual mode of transmission: Secondary | ICD-10-CM | POA: Diagnosis present

## 2023-04-30 DIAGNOSIS — R8290 Unspecified abnormal findings in urine: Secondary | ICD-10-CM | POA: Diagnosis not present

## 2023-04-30 LAB — POCT URINALYSIS DIP (MANUAL ENTRY)
Bilirubin, UA: NEGATIVE
Blood, UA: NEGATIVE
Glucose, UA: NEGATIVE mg/dL
Ketones, POC UA: NEGATIVE mg/dL
Leukocytes, UA: NEGATIVE
Nitrite, UA: NEGATIVE
Protein Ur, POC: NEGATIVE mg/dL
Spec Grav, UA: >=1.03 — AB (ref 1.010–1.025)
Urobilinogen, UA: 1 U/dL
pH, UA: 6 (ref 5.0–8.0)

## 2023-04-30 NOTE — ED Provider Notes (Signed)
[facility]    CSN: [id number] Arrival date & time: 04/30/23  1837      History   Chief Complaint Chief Complaint  Patient presents with   Vaginal Discharge    HPI Jamie Neal is a 27 y.o. female who is 6 months pregnant presents due to having gray color vaginal with foul odor which she suspects is BV since she has it often and this has been going on x 2 weeks. She has odor noticed urinary urgency. She will also have STD testing though she is still with same partner. Declined HIV. She has not contacted her OB doctor since it takes weeks to be seen. Denies abdominal pain or spotting.  Past Medical History:  Diagnosis Date   BV (bacterial vaginosis)    Missed period 12/21/2022   UTI (urinary tract infection)     Patient Active Problem List   Diagnosis Date Noted   GBS bacteriuria 03/28/2023   Supervision of high risk pregnancy, antepartum 02/07/2023   Herpes infection 02/25/2020   Obesity (BMI 30.0-34.9) 08/18/2019    Past Surgical History:  Procedure Laterality Date   CESAREAN SECTION  03/12/2020   Procedure: CESAREAN SECTION;  Surgeon: Warden Fillers, MD;  Location: [facility];  Service: Obstetrics;;    OB History     Gravida  4   Para  2   Term  2   Preterm      AB  1   Living  2      SAB  1   IAB  0   Ectopic  0   Multiple  0   Live Births  2            Home Medications    Prior to Admission medications   Medication Sig Start Date End Date Taking? Authorizing Provider  multivitamin prental (TRINATAL) 60-1 MG TABS tablet Take 1 tablet by mouth daily. 02/07/23  Yes Hermina Staggers, MD    Family History Family History  Problem Relation Age of Onset   Healthy Mother    Diabetes Father    Hypertension Father     Social History Social History   Tobacco Use   Smoking status: Never   Smokeless tobacco: Never  Vaping Use   Vaping status: Former  Substance Use Topics   Alcohol use: Not Currently    Comment:  occasionally   Drug use: Never     Allergies   Bee venom   Review of Systems Review of Systems As noted in HPI  Physical Exam Triage Vital Signs ED Triage Vitals  Encounter Vitals Group     BP 04/30/23 1930 98/65     Systolic BP Percentile --      Diastolic BP Percentile --      Pulse Rate 04/30/23 1930 83     Resp 04/30/23 1930 18     Temp 04/30/23 1930 98.3 F (36.8 C)     Temp Source 04/30/23 1930 Oral     SpO2 04/30/23 1930 98 %     Weight 04/30/23 1932 180 lb (81.6 kg)     Height 04/30/23 1932 5' (1.524 m)     Head Circumference --      Peak Flow --      Pain Score 04/30/23 1932 0     Pain Loc --      Pain Education --      Exclude from Growth Chart --    No data found.  Updated Vital  Signs BP 98/65 (BP Location: Left Arm)   Pulse 83   Temp 98.3 F (36.8 C) (Oral)   Resp 18   Ht 5' (1.524 m)   Wt 180 lb (81.6 kg)   LMP 10/30/2022 (Exact Date)   SpO2 98%   BMI 35.15 kg/m   Visual Acuity Right Eye Distance:   Left Eye Distance:   Bilateral Distance:    Right Eye Near:   Left Eye Near:    Bilateral Near:     Physical Exam Physical Exam Vitals and nursing note reviewed.  Constitutional:      General: She is not in acute distress.    Appearance: She is not toxic-appearing.  HENT:     Head: Normocephalic.     Right Ear: External ear normal.     Left Ear: External ear normal.  Eyes:     General: No scleral icterus.    Conjunctiva/sclera: Conjunctivae normal.  Pulmonary:     Effort: Pulmonary effort is normal.  Abdominal:     General: Bowel sounds are normal.     Palpations: Abdomen is soft. Fundus above umbilicus.      Tenderness: There is no guarding or rebound.     Comments: - CVA tenderness   Musculoskeletal:        General: Normal range of motion.     Cervical back: Neck supple.     Skin:    General: Skin is warm and dry.     Findings: No rash.  Neurological:     Mental Status: She is alert and oriented to person, place, and  time.     Gait: Gait normal.  Psychiatric:        Mood and Affect: Mood normal.        Behavior: Behavior normal.        Thought Content: Thought content normal.        Judgment: Judgment normal.    UC Treatments / Results  Labs (all labs ordered are listed, but only abnormal results are displayed) Labs Reviewed  POCT URINALYSIS DIP (MANUAL ENTRY) - Abnormal; Notable for the following components:      Result Value   Color, UA straw (*)    Clarity, UA hazy (*)    Spec Grav, UA >=1.030 (*)    All other components within normal limits  URINE CULTURE  CERVICOVAGINAL ANCILLARY ONLY    EKG   Radiology No results found.  Procedures Procedures (including critical care time)  Medications Ordered in UC Medications - No data to display  Initial Impression / Assessment and Plan / UC Course  I have reviewed the triage vital signs and the nursing notes.  Pertinent labs  results that were available during my care of the patient were reviewed by me and considered in my medical decision making (see chart for details).  Advised to push more fluids since her urine is so concentrated. Urine culture sent out. We will call her when the vaginal swab is back if positive.    Final Clinical Impressions(s) / UC Diagnoses   Final diagnoses:  Screen for STD (sexually transmitted disease)  Abnormal urine odor     Discharge Instructions      We will notify your when the urine culture and vaginal swab are back if positive and if treatment is needed      ED Prescriptions   None    PDMP not reviewed this encounter.   Garey Ham, Cordelia Poche 04/30/23 2041

## 2023-04-30 NOTE — ED Triage Notes (Signed)
Patient c/o vaginal discharge w/foul odor x 2 weeks.  Patient c/o urinary urgency, grey color discharge.  Requesting STI check no bloodwork.

## 2023-04-30 NOTE — Discharge Instructions (Addendum)
We will notify your when the urine culture and vaginal swab are back if positive and if treatment is needed

## 2023-05-02 LAB — URINE CULTURE
Culture: <10000 — AB
Special Requests: NORMAL

## 2023-05-02 NOTE — L&D Delivery Note (Cosign Needed Addendum)
 OB/GYN Faculty Practice Delivery Note  Galene Perdomo is a 28 y.o. [id number] s/p VAVD at [redacted]w[redacted]d. She was admitted for SOL.   ROM: 4h 10m with clear fluid GBS Status:  Positive/-- (11/21 0000) Maximum Maternal Temperature: 98.65F  Labor Progress: Initial SVE: 5/90/-1. She then progressed to complete.   Delivery Date/Time: 1429 5/2 Delivery: Called to room and patient was complete and pushing. Patient pushing well to +2 station however fetus with recurrent decelerations to 60s.   Operative Delivery Note  Indication for operative vaginal delivery: recurrent late decelerations and prolonged decelerations to the 60s  Patient was examined and found to be fully dilated with fetal station of +2.  Patient's bladder was noted to be empty, and there were no known fetal contraindications to operative vaginal delivery. FHR tracing remarkable for recurrent late decelerations and prolonged decelerations to the 60s.   Risks of vacuum assistance were discussed in detail, including but not limited to, bleeding, infection, damage to maternal tissues, fetal cephalohematoma, inability to effect vaginal delivery of the head or shoulder dystocia that cannot be resolved by established maneuvers and need for emergency cesarean section.  Patient gave verbal consent.  The Kiwi vacuum cup was positioned over the sagittal suture 3 cm anterior to posterior fontanelle.  Pressure was then increased to green, and the patient was instructed to push.  Pulling was administered along the pelvic curve while patient was pushing; there were 2 contractions and 1 popoffs.  Vacuum was reduced in between contractions.  The infant was then delivered atraumatically, noted to be a viable female infant, Apgars of 8 and 9. Neonatology called when VAVD decision made and present shortly after delivery.  There was spontaneous placental delivery, intact with three-vessel cord.  Intact perineum noted on examination. [id number], epidural anesthesia.    Sponge, instrument and needle counts were correct x 2.  The patient and baby were stable after delivery and remained in couplet care, with plans to transfer later to postpartum unit  Head delivered DOA. Nuchal cord present. Shoulder and body delivered in usual fashion. Nuchal easily reduced. Infant with spontaneous cry, placed on mother's abdomen, dried and stimulated. Cord clamped x 2 after 1-minute delay, and cut by FOB. Cord blood drawn. Placenta delivered spontaneously with gentle cord traction. Fundus firm with massage, lower uterine sweep, and Pitocin . Labia, perineum, vagina, and cervix inspected with out laceration.   Called back to the room about an hour after delivery and noted to have above average bleeding with rubs. Pt had not received previously ordered TXA so will get now.  However concern for more atony so after confirm bladder emptied did full fundal sweep after consent and pIV pain medications.  Retrieved small clot and wispy piece of membrane.  BSUS used to evaluate uterine swipe.  Noted to have small clot in lower uterine segment.  Second lower uterine sweep with lap to ensure no retained products and retrieved small dime size clot.  Uterine stripe again evaluated with clear strip.   Written for 2g Ancef .   Baby Weight: pending  Placenta: 3 vessel, intact. Sent to L&D Complications: Recurrent decelerations requiring VAVD as above Lacerations: None EBL: 309 mL Analgesia: Epidural   Infant:  APGAR (1 MIN): 8  APGAR (5 MINS): 9   Ebony Goldstein, MD FMOB Fellow, Faculty practice [facility], [facility] 08/31/23  4:00 PM

## 2023-05-03 ENCOUNTER — Telehealth (HOSPITAL_COMMUNITY): Payer: Self-pay

## 2023-05-03 LAB — CERVICOVAGINAL ANCILLARY ONLY
Bacterial Vaginitis (gardnerella): POSITIVE — AB
Candida Glabrata: NEGATIVE
Candida Vaginitis: POSITIVE — AB
Chlamydia: NEGATIVE
Comment: NEGATIVE
Comment: NEGATIVE
Comment: NEGATIVE
Comment: NEGATIVE
Comment: NEGATIVE
Comment: NORMAL
Neisseria Gonorrhea: NEGATIVE
Trichomonas: NEGATIVE

## 2023-05-03 MED ORDER — METRONIDAZOLE 500 MG PO TABS: 500.0000 mg | ORAL_TABLET | Freq: Two times a day (BID) | ORAL | 0 refills | Status: DC

## 2023-05-03 MED ORDER — FLUCONAZOLE 150 MG PO TABS: 150.0000 mg | ORAL_TABLET | Freq: Every day | ORAL | 0 refills | Status: DC

## 2023-05-03 NOTE — Telephone Encounter (Signed)
 Patient tested positive for BV and yeast. Requesting medication be sent in to [facility] on [address].

## 2023-05-14 ENCOUNTER — Inpatient Hospital Stay (HOSPITAL_COMMUNITY)
Admission: AD | Admit: 2023-05-14 | Discharge: 2023-05-14 | Disposition: A | Discharge status: Home / Self Care | Payer: Medicaid Other | Attending: Obstetrics & Gynecology | Admitting: Obstetrics & Gynecology

## 2023-05-14 ENCOUNTER — Encounter (HOSPITAL_COMMUNITY): Payer: Self-pay | Admitting: Obstetrics & Gynecology

## 2023-05-14 DIAGNOSIS — B9689 Other specified bacterial agents as the cause of diseases classified elsewhere: Secondary | ICD-10-CM | POA: Insufficient documentation

## 2023-05-14 DIAGNOSIS — Z3A25 25 weeks gestation of pregnancy: Secondary | ICD-10-CM | POA: Diagnosis not present

## 2023-05-14 DIAGNOSIS — R1030 Lower abdominal pain, unspecified: Secondary | ICD-10-CM | POA: Diagnosis not present

## 2023-05-14 DIAGNOSIS — O26892 Other specified pregnancy related conditions, second trimester: Secondary | ICD-10-CM | POA: Diagnosis not present

## 2023-05-14 DIAGNOSIS — O23592 Infection of other part of genital tract in pregnancy, second trimester: Secondary | ICD-10-CM | POA: Diagnosis present

## 2023-05-14 DIAGNOSIS — O26899 Other specified pregnancy related conditions, unspecified trimester: Secondary | ICD-10-CM

## 2023-05-14 DIAGNOSIS — R102 Pelvic and perineal pain: Secondary | ICD-10-CM | POA: Diagnosis not present

## 2023-05-14 LAB — URINALYSIS, ROUTINE W REFLEX MICROSCOPIC
Bilirubin Urine: NEGATIVE
Glucose, UA: NEGATIVE mg/dL
Hgb urine dipstick: NEGATIVE
Ketones, ur: NEGATIVE mg/dL
Leukocytes,Ua: NEGATIVE
Nitrite: NEGATIVE
Protein, ur: NEGATIVE mg/dL
Specific Gravity, Urine: 1.006 (ref 1.005–1.030)
pH: 7 (ref 5.0–8.0)

## 2023-05-14 MED ORDER — FLUCONAZOLE 150 MG PO TABS: 150.0000 mg | ORAL_TABLET | Freq: Every day | ORAL | 0 refills | Status: DC

## 2023-05-14 NOTE — MAU Note (Signed)
..  Jamie Neal is a 28 y.o. at [redacted]w[redacted]d here in MAU reporting: sharp shooting lower abdominal pain that started x2 nights ago. States she has also had burning and frequency when she voids that started x2 weeks ago. Denies VB or LOF. Has not felt baby move since yesterday but hasn't been paying attention.   Pain score: 8 Vitals:   05/14/23 1351  BP: 97/60  Pulse: 97  Resp: 14  Temp: 98 F (36.7 C)  SpO2: 100%     Lab orders placed from triage:   UA

## 2023-05-14 NOTE — MAU Provider Note (Signed)
 Obstetric Attending MAU Note  Chief Complaint:  Abdominal Pain   Event Date/Time   First Provider Initiated Contact with Patient 05/14/23 1410     HPI: Jamie Neal is a 28 y.o. [id number] at [redacted]w[redacted]d who presents to maternity admissions reporting sharp shooting lower abdominal pain that started 2 nights ago. Pain was on left side, shooting into vagina.  No current pain. Also states she has also had burning and frequency when she voids that started 2 weeks ago. No back pain, no fevers. Currently being treated for BV, worried about rebound yeast infection.  Denies contractions, leakage of fluid or vaginal bleeding. Good fetal movement.   Pregnancy Course: Receives care at [facility] Patient Active Problem List   Diagnosis Date Noted   GBS bacteriuria 03/28/2023   Supervision of high risk pregnancy, antepartum 02/07/2023   Herpes infection 02/25/2020   Obesity (BMI 30.0-34.9) 08/18/2019    Past Medical History:  Diagnosis Date   BV (bacterial vaginosis)    Missed period 12/21/2022   UTI (urinary tract infection)     OB History  Gravida Para Term Preterm AB Living  4 2 2  1 2   SAB IAB Ectopic Multiple Live Births  1 0 0 0 2    # Outcome Date GA Lbr Len/2nd Weight Sex Type Anes PTL Lv  4 Current           3 SAB 06/2022          2 Term 03/12/20 [redacted]w[redacted]d  4220 g M CS-LTranv Gen, EPI  LIV  1 Term 10/15/16 [redacted]w[redacted]d 17:00 / 01:30 4224 g F Vag-Spont EPI  LIV     Birth Comments: wnl    Past Surgical History:  Procedure Laterality Date   CESAREAN SECTION  03/12/2020   Procedure: CESAREAN SECTION;  Surgeon: Zina Jerilynn LABOR, MD;  Location: [facility];  Service: Obstetrics;;    Family History: Family History  Problem Relation Age of Onset   Healthy Mother    Diabetes Father    Hypertension Father     Social History: Social History   Tobacco Use   Smoking status: Never   Smokeless tobacco: Never  Vaping Use   Vaping status: Former  Substance Use Topics   Alcohol use: Not Currently     Comment: occasionally   Drug use: Never    Allergies:  Allergies  Allergen Reactions   Bee Venom    Pork-Derived Products     Cultural practice    Medications Prior to Admission  Medication Sig Dispense Refill Last Dose/Taking   metroNIDAZOLE  (FLAGYL ) 500 MG tablet Take 1 tablet (500 mg total) by mouth 2 (two) times daily. 14 tablet 0 05/14/2023 Morning   multivitamin prental (TRINATAL) 60-1 MG TABS tablet Take 1 tablet by mouth daily. 30 tablet 12 05/14/2023 Morning   [DISCONTINUED] fluconazole  (DIFLUCAN ) 150 MG tablet Take 1 tablet (150 mg total) by mouth daily. 1 tablet 0     ROS: Pertinent findings in history of present illness.  Physical Exam  Blood pressure 104/68, pulse 90, temperature 98 F (36.7 C), temperature source Oral, resp. rate 14, last menstrual period 10/30/2022, SpO2 100%. CONSTITUTIONAL: Well-developed, well-nourished female in no acute distress.  HENT:  Normocephalic, atraumatic, External right and left ear normal. Oropharynx is clear and moist EYES: Conjunctivae and EOM are normal. Pupils are equal, round, and reactive to light. No scleral icterus.  NECK: Normal range of motion, supple, no masses SKIN: Skin is warm and dry. No rash noted. Not diaphoretic.  No erythema. No pallor. NEUROLGIC: Alert and oriented to person, place, and time. Normal reflexes, muscle tone coordination. No cranial nerve deficit noted. PSYCHIATRIC: Normal mood and affect. Normal behavior. Normal judgment and thought content. CARDIOVASCULAR: Normal heart rate noted, regular rhythm RESPIRATORY: Effort and breath sounds normal, no problems with respiration noted ABDOMEN: Soft, nontender, nondistended, gravid appropriate for gestational age MUSCULOSKELETAL: Normal range of motion. No edema and no tenderness. 2+ distal pulses. SPECULUM EXAM: Deferred   FHT:  Baseline 145 , moderate variability, 10 x 10 accelerations present, no significant decelerations Contractions: none   Labs: Results  for orders placed or performed during the hospital encounter of 05/14/23 (from the past 24 hours)  Urinalysis, Routine w reflex microscopic -Urine, Clean Catch     Status: Abnormal   Collection Time: 05/14/23  2:06 PM  Result Value Ref Range   Color, Urine STRAW (A) YELLOW   APPearance CLEAR CLEAR   Specific Gravity, Urine 1.006 1.005 - 1.030   pH 7.0 5.0 - 8.0   Glucose, UA NEGATIVE NEGATIVE mg/dL   Hgb urine dipstick NEGATIVE NEGATIVE   Bilirubin Urine NEGATIVE NEGATIVE   Ketones, ur NEGATIVE NEGATIVE mg/dL   Protein, ur NEGATIVE NEGATIVE mg/dL   Nitrite NEGATIVE NEGATIVE   Leukocytes,Ua NEGATIVE NEGATIVE    MAU Course: No pain during her MAU encounter  Assessment: 1. Vaginitis affecting pregnancy in second trimester, antepartum   2. Pain of round ligament during pregnancy   3. [redacted] weeks gestation of pregnancy     Plan: Patient reassured about round ligament pain Continue Metronidazole  for BV, Diflucan  x 1 ordered for rebound yeast infection. Preterm labor precautions and fetal movement precautions reviewed Follow up with OB provider Discharge home in stable condition   Allergies as of 05/14/2023       Reactions   Bee Venom    Pork-derived Products    Cultural practice        Medication List     TAKE these medications    fluconazole  150 MG tablet Commonly known as: Diflucan  Take 1 tablet (150 mg total) by mouth daily.   metroNIDAZOLE  500 MG tablet Commonly known as: FLAGYL  Take 1 tablet (500 mg total) by mouth 2 (two) times daily.   multivitamin prental 60-1 MG Tabs tablet Take 1 tablet by mouth daily.        Herchel Gloris LABOR, MD 05/14/2023 2:52 PM

## 2023-05-16 ENCOUNTER — Encounter: Payer: Medicaid Other | Admitting: Obstetrics & Gynecology

## 2023-05-18 ENCOUNTER — Encounter: Payer: Medicaid Other | Admitting: Obstetrics

## 2023-05-29 ENCOUNTER — Encounter: Payer: Medicaid Other | Admitting: Obstetrics

## 2023-06-07 ENCOUNTER — Other Ambulatory Visit: Payer: Medicaid Other

## 2023-06-07 ENCOUNTER — Encounter: Payer: Medicaid Other | Admitting: Obstetrics and Gynecology

## 2023-06-19 ENCOUNTER — Other Ambulatory Visit: Payer: Medicaid Other

## 2023-06-19 ENCOUNTER — Encounter: Payer: Medicaid Other | Admitting: Obstetrics & Gynecology

## 2023-06-19 DIAGNOSIS — Z3A30 30 weeks gestation of pregnancy: Secondary | ICD-10-CM

## 2023-06-19 DIAGNOSIS — O0990 Supervision of high risk pregnancy, unspecified, unspecified trimester: Secondary | ICD-10-CM

## 2023-06-26 ENCOUNTER — Encounter: Payer: Medicaid Other | Admitting: Obstetrics

## 2023-06-26 ENCOUNTER — Other Ambulatory Visit: Payer: Medicaid Other

## 2023-07-01 NOTE — Progress Notes (Deleted)
   PRENATAL VISIT NOTE  Subjective:  Jamie Neal is a 28 y.o. [phone] at [redacted]w[redacted]d being seen today for ongoing prenatal care.  She is currently monitored for the following issues for this low-risk pregnancy and has Obesity (BMI 30.0-34.9); Herpes infection; Supervision of high risk pregnancy, antepartum; and GBS bacteriuria on their problem list.  Patient reports no concerns. Denies leaking of fluid, vaginal bleeding, contractions.  Endorses fetal movement.  The following portions of the patient's history were reviewed and updated as appropriate: allergies, current medications, past family history, past medical history, past social history, past surgical history and problem list.   Objective:  There were no vitals filed for this visit.  Fetal Status:           General:  Alert, oriented and cooperative. Patient is in no acute distress.  Skin: Skin is warm and dry. No rash noted.   Cardiovascular: Normal heart rate noted  Respiratory: Normal respiratory effort, no problems with respiration noted  Abdomen: Soft, gravid, appropriate for gestational age.        Pelvic: Cervical exam deferred        Extremities: Normal range of motion.     Mental Status: Normal mood and affect. Normal behavior. Normal judgment and thought content.   Assessment and Plan:  Pregnancy: [id number] at [redacted]w[redacted]d  1. Encounter for supervision of low-risk pregnancy in third trimester (Primary) Patient doing well, feeling regular fetal movement BP, FHR, FH appropriate  2. [redacted] weeks gestation of pregnancy Anticipatory guidance about next visits/weeks of pregnancy given.  28 week labs today   3. Herpes infection Prophylaxis at 36 weeks***  4. Obesity (BMI 30.0-34.9) No A1C on file; 2hr GTT pending    Preterm labor symptoms and general obstetric precautions including but not limited to vaginal bleeding, contractions, leaking of fluid and fetal movement were reviewed in detail with the patient.  Please refer to After  Visit Summary for other counseling recommendations.   No follow-ups on file.  Future Appointments  Date Time Provider Department Center  07/02/2023  9:00 AM [facility] None  07/02/2023 11:15 AM Ralene Muskrat, PA-C [facility] None    Ralene Muskrat, [place]

## 2023-07-02 ENCOUNTER — Other Ambulatory Visit: Payer: Medicaid Other

## 2023-07-02 ENCOUNTER — Encounter: Payer: Medicaid Other | Admitting: Physician Assistant

## 2023-07-02 DIAGNOSIS — B009 Herpesviral infection, unspecified: Secondary | ICD-10-CM

## 2023-07-02 DIAGNOSIS — E66811 Obesity, class 1: Secondary | ICD-10-CM

## 2023-07-02 DIAGNOSIS — Z3493 Encounter for supervision of normal pregnancy, unspecified, third trimester: Secondary | ICD-10-CM

## 2023-07-02 DIAGNOSIS — Z3A32 32 weeks gestation of pregnancy: Secondary | ICD-10-CM

## 2023-07-25 ENCOUNTER — Ambulatory Visit (HOSPITAL_COMMUNITY): Admission: EM | Admit: 2023-07-25 | Discharge: 2023-07-25 | Disposition: A | Discharge status: Home / Self Care

## 2023-07-26 ENCOUNTER — Encounter: Admitting: Obstetrics and Gynecology

## 2023-07-26 ENCOUNTER — Other Ambulatory Visit

## 2023-08-01 ENCOUNTER — Ambulatory Visit (HOSPITAL_COMMUNITY): Admission: EM | Admit: 2023-08-01 | Discharge: 2023-08-01 | Discharge status: Against Medical Advice

## 2023-08-01 NOTE — ED Notes (Signed)
 No answer in waiting or by phone with NVM available.

## 2023-08-03 ENCOUNTER — Ambulatory Visit: Admitting: Physician Assistant

## 2023-08-03 ENCOUNTER — Other Ambulatory Visit

## 2023-08-03 ENCOUNTER — Other Ambulatory Visit (HOSPITAL_COMMUNITY)
Admission: RE | Admit: 2023-08-03 | Discharge: 2023-08-03 | Disposition: A | Discharge status: Home / Self Care | Source: Ambulatory Visit | Attending: Physician Assistant | Admitting: Physician Assistant

## 2023-08-03 VITALS — BP 123/71 | HR 84 | Wt 188.3 lb

## 2023-08-03 DIAGNOSIS — O26893 Other specified pregnancy related conditions, third trimester: Secondary | ICD-10-CM

## 2023-08-03 DIAGNOSIS — Z3A36 36 weeks gestation of pregnancy: Secondary | ICD-10-CM

## 2023-08-03 DIAGNOSIS — B009 Herpesviral infection, unspecified: Secondary | ICD-10-CM | POA: Diagnosis not present

## 2023-08-03 DIAGNOSIS — N898 Other specified noninflammatory disorders of vagina: Secondary | ICD-10-CM | POA: Diagnosis not present

## 2023-08-03 DIAGNOSIS — O0993 Supervision of high risk pregnancy, unspecified, third trimester: Secondary | ICD-10-CM | POA: Diagnosis not present

## 2023-08-03 DIAGNOSIS — R300 Dysuria: Secondary | ICD-10-CM

## 2023-08-03 MED ORDER — VALACYCLOVIR HCL 500 MG PO TABS: 500.0000 mg | ORAL_TABLET | Freq: Two times a day (BID) | ORAL | 2 refills | Status: DC

## 2023-08-03 NOTE — Progress Notes (Signed)
   PRENATAL VISIT NOTE  Subjective:  Jamie Neal is a 28 y.o. [phone] at [redacted]w[redacted]d being seen today for ongoing prenatal care.  She is currently monitored for the following issues for this low-risk pregnancy and has Obesity (BMI 30.0-34.9); Herpes infection; Supervision of high risk pregnancy, antepartum; and GBS bacteriuria on their problem list.  Patient reports no concerns.  Contractions: Not present. Vag. Bleeding: None.  Movement: Present. Denies leaking of fluid.   The following portions of the patient's history were reviewed and updated as appropriate: allergies, current medications, past family history, past medical history, past social history, past surgical history and problem list.   Objective:   Vitals:   08/03/23 1103  BP: 123/71  Pulse: 84  Weight: 188 lb 4.8 oz (85.4 kg)    Fetal Status: Fetal Heart Rate (bpm): 137   Movement: Present     General:  Alert, oriented and cooperative. Patient is in no acute distress.  Skin: Skin is warm and dry. No rash noted.   Cardiovascular: Normal heart rate noted  Respiratory: Normal respiratory effort, no problems with respiration noted  Abdomen: Soft, gravid, appropriate for gestational age.  Pain/Pressure: Present     Pelvic: Cervical exam deferred        Extremities: Normal range of motion.  Edema: Trace  Mental Status: Normal mood and affect. Normal behavior. Normal judgment and thought content.   Assessment and Plan:  Pregnancy: [id number] at [redacted]w[redacted]d  1. Supervision of high risk pregnancy in third trimester (Primary) Patient is doing well, feeling regular fetal movement  BP, FHR, FH appropriate  2. [redacted] weeks gestation of pregnancy Anticipatory guidance about next visits/weeks of pregnancy given.  - Glucose Tolerance, 2 Hours w/1 Hour - CBC  - HIV antibody (with reflex) - RPR  3. Herpes infection - valACYclovir (VALTREX) 500 MG tablet; Take 1 tablet (500 mg total) by mouth 2 (two) times daily.  Dispense: 60 tablet; Refill:  2  4. Vaginal itching - Cervicovaginal ancillary only( [facility])  5. Dysuria during pregnancy in third trimester - Urinalysis, Routine w reflex microscopic - Urine Culture   Preterm labor symptoms and general obstetric precautions including but not limited to vaginal bleeding, contractions, leaking of fluid and fetal movement were reviewed in detail with the patient.  Please refer to After Visit Summary for other counseling recommendations.   No follow-ups on file.  No future appointments.   Ralene Muskrat, PA-C

## 2023-08-03 NOTE — Progress Notes (Signed)
 Pt presents for rob. Pt states that she is having yeast and bv symptoms. Pt wants a refill on valtrex but it is not showing up on her medication list.

## 2023-08-04 LAB — CBC
Hematocrit: 32.3 % — ABNORMAL LOW (ref 34.0–46.6)
Hemoglobin: 10.7 g/dL — ABNORMAL LOW (ref 11.1–15.9)
MCH: 29.2 pg (ref 26.6–33.0)
MCHC: 33.1 g/dL (ref 31.5–35.7)
MCV: 88 fL (ref 79–97)
Platelets: 176 10*3/uL (ref 150–450)
RBC: 3.66 x10E6/uL — ABNORMAL LOW (ref 3.77–5.28)
RDW: 12.8 % (ref 11.7–15.4)
WBC: 6.7 10*3/uL (ref 3.4–10.8)

## 2023-08-04 LAB — RPR: RPR Ser Ql: NONREACTIVE

## 2023-08-04 LAB — GLUCOSE TOLERANCE, 2 HOURS W/ 1HR
Glucose, 1 hour: 105 mg/dL (ref 70–179)
Glucose, 2 hour: 87 mg/dL (ref 70–152)
Glucose, Fasting: 74 mg/dL (ref 70–91)

## 2023-08-04 LAB — HIV ANTIBODY (ROUTINE TESTING W REFLEX): HIV Screen 4th Generation wRfx: NONREACTIVE

## 2023-08-05 ENCOUNTER — Encounter: Payer: Self-pay | Admitting: Physician Assistant

## 2023-08-05 LAB — URINE CULTURE

## 2023-08-06 ENCOUNTER — Other Ambulatory Visit (INDEPENDENT_AMBULATORY_CARE_PROVIDER_SITE_OTHER): Payer: Self-pay | Admitting: Obstetrics & Gynecology

## 2023-08-06 ENCOUNTER — Other Ambulatory Visit: Payer: Self-pay

## 2023-08-06 LAB — CERVICOVAGINAL ANCILLARY ONLY
Bacterial Vaginitis (gardnerella): NEGATIVE
Candida Glabrata: NEGATIVE
Candida Vaginitis: POSITIVE — AB
Chlamydia: NEGATIVE
Comment: NEGATIVE
Comment: NEGATIVE
Comment: NEGATIVE
Comment: NEGATIVE
Comment: NEGATIVE
Comment: NORMAL
Neisseria Gonorrhea: NEGATIVE
Trichomonas: NEGATIVE

## 2023-08-06 MED ORDER — FLUCONAZOLE 150 MG PO TABS: 150.0000 mg | ORAL_TABLET | Freq: Every day | ORAL | 0 refills | Status: DC

## 2023-08-06 NOTE — Progress Notes (Signed)
 Diflucan rx sent for yeast infection. Pt previously treated with diflucan for the same on 05/14/23.

## 2023-08-08 ENCOUNTER — Encounter: Payer: Self-pay | Admitting: Physician Assistant

## 2023-08-27 ENCOUNTER — Inpatient Hospital Stay (HOSPITAL_COMMUNITY)
Admission: AD | Admit: 2023-08-27 | Discharge: 2023-08-27 | Discharge status: Against Medical Advice | Attending: Obstetrics and Gynecology | Admitting: Obstetrics and Gynecology

## 2023-08-28 ENCOUNTER — Ambulatory Visit: Admitting: Obstetrics

## 2023-08-28 ENCOUNTER — Encounter: Payer: Self-pay | Admitting: Obstetrics

## 2023-08-28 VITALS — BP 103/67 | HR 74 | Wt 193.3 lb

## 2023-08-28 DIAGNOSIS — O0993 Supervision of high risk pregnancy, unspecified, third trimester: Secondary | ICD-10-CM

## 2023-08-28 NOTE — Progress Notes (Signed)
 Pt presents for rob. Pt is [redacted]w[redacted]d needs to be put on NST

## 2023-08-28 NOTE — Progress Notes (Signed)
 Subjective:  Jamie Neal is a 28 y.o. [phone] at [redacted]w[redacted]d being seen today for ongoing prenatal care.  She is currently monitored for the following issues for this high-risk pregnancy and has Obesity (BMI 30.0-34.9); Herpes infection; Supervision of high risk pregnancy, antepartum; and GBS bacteriuria on their problem list.  Patient reports no complaints.  Contractions: Irritability. Vag. Bleeding: None.  Movement: Present. Denies leaking of fluid.   The following portions of the patient's history were reviewed and updated as appropriate: allergies, current medications, past family history, past medical history, past social history, past surgical history and problem list. Problem list updated.  Objective:   Vitals:   08/28/23 0913  BP: 103/67  Pulse: 74  Weight: 193 lb 4.8 oz (87.7 kg)    Fetal Status:     Movement: Present     General:  Alert, oriented and cooperative. Patient is in no acute distress.  Skin: Skin is warm and dry. No rash noted.   Cardiovascular: Normal heart rate noted  Respiratory: Normal respiratory effort, no problems with respiration noted  Abdomen: Soft, gravid, appropriate for gestational age. Pain/Pressure: Present     Pelvic:  Cervical exam deferred        Extremities: Normal range of motion.  Edema: None  Mental Status: Normal mood and affect. Normal behavior. Normal judgment and thought content.   Urinalysis:      Assessment and Plan:  Pregnancy: [id number] at [redacted]w[redacted]d  1. Supervision of high risk pregnancy in third trimester (Primary) - NST:  Reactive.  FHR 140-155.  Good variability.  15x15 accels.  No decels.  Term labor symptoms and general obstetric precautions including but not limited to vaginal bleeding, contractions, leaking of fluid and fetal movement were reviewed in detail with the patient. Please refer to After Visit Summary for other counseling recommendations.   Return in about 4 weeks (around 09/25/2023) for postpartum visit.   Gabrielle Joiner, MD 08/28/2023

## 2023-08-29 ENCOUNTER — Telehealth: Payer: Self-pay

## 2023-08-29 ENCOUNTER — Other Ambulatory Visit: Payer: Self-pay | Admitting: Obstetrics and Gynecology

## 2023-08-29 DIAGNOSIS — O0993 Supervision of high risk pregnancy, unspecified, third trimester: Secondary | ICD-10-CM

## 2023-08-29 NOTE — Telephone Encounter (Signed)
 Patient called back and would like to proceed with scheduling her delivery for 08/31/23 w/ Dr. Sari Cunning @12pm . I provided pre-op instructions and surgery details by phone. Written details will be sent to patient's Mychart,

## 2023-08-29 NOTE — Telephone Encounter (Signed)
 Called patient to let her know 08/31/23 was available for her delivery. I advised Dr. Racheal Buddle was not scheduled for the OR and would be on vacation next week. I provided patient with name of her delivering doctor(Dr. Sari Cunning). Patient would like to research the provider before scheduling. Patient aggred to call me back at 10:30 am w/ a decision.

## 2023-08-30 ENCOUNTER — Encounter (HOSPITAL_COMMUNITY): Payer: Self-pay

## 2023-08-30 ENCOUNTER — Encounter (HOSPITAL_COMMUNITY)
Admission: RE | Admit: 2023-08-30 | Discharge: 2023-08-30 | Disposition: A | Discharge status: Home / Self Care | Source: Ambulatory Visit | Attending: Obstetrics and Gynecology | Admitting: Obstetrics and Gynecology

## 2023-08-30 NOTE — Patient Instructions (Addendum)
 Jamie Neal  08/30/2023   Your procedure is scheduled on:  08/31/2023  Arrive at 0930 at Entrance C on [address] at [facility]  and [address]. You are invited to use the FREE valet parking or use the Visitor's parking deck.  Pick up the phone at the desk and dial [phone].  Call this number if you have problems the morning of surgery: [phone]  Remember:   Do not eat food:(After Midnight) Desps de medianoche.  You may drink clear liquids until arrival at ___1015__.  Clear liquids means a liquid you can see thru.  It can have color such as Cola or Kool aid.  Tea is OK and coffee as long as no milk or creamer of any kind.  Take these medicines the morning of surgery with A SIP OF WATER :  valtrex    Do not wear jewelry, make-up or nail polish.  Do not wear lotions, powders, or perfumes. Do not wear deodorant.  Do not shave 48 hours prior to surgery.  Do not bring valuables to the hospital.  [facility] is not   responsible for any belongings or valuables brought to the hospital.  Contacts, dentures or bridgework may not be worn into surgery.  Leave suitcase in the car. After surgery it may be brought to your room.  For patients admitted to the hospital, checkout time is 11:00 AM the day of              discharge.      Please read over the following fact sheets that you were given:     Preparing for Surgery

## 2023-08-30 NOTE — Pre-Procedure Instructions (Signed)
 Pt unable to come for pat visit arrival time adjusted and sent to pt via my chart

## 2023-08-30 NOTE — Anesthesia Preprocedure Evaluation (Signed)
 Anesthesia Evaluation    Reviewed: Allergy & Precautions, Patient's Chart, lab work & pertinent test results  History of Anesthesia Complications Negative for: history of anesthetic complications  Airway        Dental   Pulmonary neg pulmonary ROS          Cardiovascular negative cardio ROS      Neuro/Psych negative neurological ROS     GI/Hepatic negative GI ROS, Neg liver ROS,,,  Endo/Other  negative endocrine ROS    Renal/GU negative Renal ROS     Musculoskeletal negative musculoskeletal ROS (+)    Abdominal   Peds  Hematology negative hematology ROS (+)   Anesthesia Other Findings Day of surgery medications reviewed with patient.  Reproductive/Obstetrics (+) Pregnancy (Hx of C/S x1)                             Anesthesia Physical Anesthesia Plan  ASA: 2  Anesthesia Plan: Spinal   Post-op Pain Management: Ofirmev  IV (intra-op)*   Induction:   PONV Risk Score and Plan: 4 or greater and Treatment may vary due to age or medical condition, Ondansetron  and Dexamethasone  Airway Management Planned: Natural Airway  Additional Equipment: None  Intra-op Plan:   Post-operative Plan:   Informed Consent:   Plan Discussed with:   Anesthesia Plan Comments:        Anesthesia Quick Evaluation

## 2023-08-30 NOTE — Patient Instructions (Signed)
 Jamie Neal

## 2023-08-31 ENCOUNTER — Inpatient Hospital Stay (HOSPITAL_COMMUNITY): Admitting: Anesthesiology

## 2023-08-31 ENCOUNTER — Inpatient Hospital Stay (HOSPITAL_COMMUNITY)
Admission: AD | Admit: 2023-08-31 | Discharge: 2023-09-02 | DRG: 807 | Disposition: A | Discharge status: Home / Self Care | Attending: Obstetrics and Gynecology | Admitting: Obstetrics and Gynecology

## 2023-08-31 ENCOUNTER — Encounter (HOSPITAL_COMMUNITY): Payer: Self-pay | Admitting: Obstetrics & Gynecology

## 2023-08-31 ENCOUNTER — Other Ambulatory Visit: Payer: Self-pay

## 2023-08-31 ENCOUNTER — Encounter (HOSPITAL_COMMUNITY): Payer: Self-pay | Admitting: Anesthesiology

## 2023-08-31 ENCOUNTER — Inpatient Hospital Stay (HOSPITAL_COMMUNITY): Admission: RE | Admit: 2023-08-31 | Payer: Self-pay | Source: Home / Self Care | Admitting: Obstetrics and Gynecology

## 2023-08-31 DIAGNOSIS — O34219 Maternal care for unspecified type scar from previous cesarean delivery: Secondary | ICD-10-CM | POA: Diagnosis present

## 2023-08-31 DIAGNOSIS — O99824 Streptococcus B carrier state complicating childbirth: Principal | ICD-10-CM | POA: Diagnosis present

## 2023-08-31 DIAGNOSIS — R8271 Bacteriuria: Secondary | ICD-10-CM

## 2023-08-31 DIAGNOSIS — Z3A40 40 weeks gestation of pregnancy: Secondary | ICD-10-CM | POA: Diagnosis not present

## 2023-08-31 DIAGNOSIS — Z8249 Family history of ischemic heart disease and other diseases of the circulatory system: Secondary | ICD-10-CM | POA: Diagnosis not present

## 2023-08-31 DIAGNOSIS — O99214 Obesity complicating childbirth: Secondary | ICD-10-CM | POA: Diagnosis present

## 2023-08-31 DIAGNOSIS — O0933 Supervision of pregnancy with insufficient antenatal care, third trimester: Secondary | ICD-10-CM | POA: Diagnosis not present

## 2023-08-31 DIAGNOSIS — Z833 Family history of diabetes mellitus: Secondary | ICD-10-CM

## 2023-08-31 DIAGNOSIS — O26893 Other specified pregnancy related conditions, third trimester: Secondary | ICD-10-CM | POA: Diagnosis present

## 2023-08-31 DIAGNOSIS — Z98891 History of uterine scar from previous surgery: Secondary | ICD-10-CM

## 2023-08-31 DIAGNOSIS — B009 Herpesviral infection, unspecified: Secondary | ICD-10-CM

## 2023-08-31 DIAGNOSIS — O480 Post-term pregnancy: Secondary | ICD-10-CM | POA: Diagnosis not present

## 2023-08-31 DIAGNOSIS — O0990 Supervision of high risk pregnancy, unspecified, unspecified trimester: Principal | ICD-10-CM

## 2023-08-31 DIAGNOSIS — O99820 Streptococcus B carrier state complicating pregnancy: Secondary | ICD-10-CM | POA: Diagnosis not present

## 2023-08-31 DIAGNOSIS — O4202 Full-term premature rupture of membranes, onset of labor within 24 hours of rupture: Secondary | ICD-10-CM | POA: Diagnosis not present

## 2023-08-31 DIAGNOSIS — O34211 Maternal care for low transverse scar from previous cesarean delivery: Secondary | ICD-10-CM | POA: Diagnosis not present

## 2023-08-31 LAB — CBC
HCT: 32 % — ABNORMAL LOW (ref 36.0–46.0)
Hemoglobin: 10.9 g/dL — ABNORMAL LOW (ref 12.0–15.0)
MCH: 28.7 pg (ref 26.0–34.0)
MCHC: 34.1 g/dL (ref 30.0–36.0)
MCV: 84.2 fL (ref 80.0–100.0)
Platelets: 190 10*3/uL (ref 150–400)
RBC: 3.8 MIL/uL — ABNORMAL LOW (ref 3.87–5.11)
RDW: 14.2 % (ref 11.5–15.5)
WBC: 9.2 10*3/uL (ref 4.0–10.5)
nRBC: 0 % (ref 0.0–0.2)

## 2023-08-31 LAB — COMPREHENSIVE METABOLIC PANEL WITH GFR
ALT: 12 U/L (ref 0–44)
AST: 20 U/L (ref 15–41)
Albumin: 2.7 g/dL — ABNORMAL LOW (ref 3.5–5.0)
Alkaline Phosphatase: 109 U/L (ref 38–126)
Anion gap: 14 (ref 5–15)
BUN: 6 mg/dL (ref 6–20)
CO2: 19 mmol/L — ABNORMAL LOW (ref 22–32)
Calcium: 8.6 mg/dL — ABNORMAL LOW (ref 8.9–10.3)
Chloride: 105 mmol/L (ref 98–111)
Creatinine, Ser: 0.55 mg/dL (ref 0.44–1.00)
GFR, Estimated: >60 mL/min (ref >60)
Glucose, Bld: 84 mg/dL (ref 70–99)
Potassium: 3.5 mmol/L (ref 3.5–5.1)
Sodium: 138 mmol/L (ref 135–145)
Total Bilirubin: 0.6 mg/dL (ref 0.0–1.2)
Total Protein: 6.6 g/dL (ref 6.5–8.1)

## 2023-08-31 LAB — RPR: RPR Ser Ql: NONREACTIVE

## 2023-08-31 LAB — TYPE AND SCREEN
ABO/RH(D): O POS
Antibody Screen: NEGATIVE

## 2023-08-31 SURGERY — Surgical Case
Anesthesia: Regional

## 2023-08-31 MED ORDER — TRANEXAMIC ACID-NACL 1000-0.7 MG/100ML-% IV SOLN
INTRAVENOUS | Status: AC
  Administered 2023-08-31: 1000 mg via INTRAVENOUS
  Filled 2023-08-31: qty 100

## 2023-08-31 MED ORDER — ACETAMINOPHEN 325 MG PO TABS
650.0000 mg | ORAL_TABLET | ORAL | Status: DC | PRN
  Administered 2023-08-31: 650 mg via ORAL
  Filled 2023-08-31: qty 2

## 2023-08-31 MED ORDER — OXYCODONE-ACETAMINOPHEN 5-325 MG PO TABS: 2.0000 | ORAL_TABLET | ORAL | Status: DC | PRN

## 2023-08-31 MED ORDER — ACETAMINOPHEN 325 MG PO TABS: 650.0000 mg | ORAL_TABLET | ORAL | Status: DC | PRN

## 2023-08-31 MED ORDER — LIDOCAINE HCL (PF) 1 % IJ SOLN: 30.0000 mL | INTRAMUSCULAR | Status: DC | PRN

## 2023-08-31 MED ORDER — OXYTOCIN-SODIUM CHLORIDE 30-0.9 UT/500ML-% IV SOLN
2.5000 [IU]/h | INTRAVENOUS | Status: DC
  Administered 2023-08-31: 2.5 [IU]/h via INTRAVENOUS
  Filled 2023-08-31: qty 500

## 2023-08-31 MED ORDER — DIPHENHYDRAMINE HCL 50 MG/ML IJ SOLN: 12.5000 mg | INTRAMUSCULAR | Status: DC | PRN

## 2023-08-31 MED ORDER — SODIUM CHLORIDE 0.9 % IV SOLN
5.0000 10*6.[IU] | Freq: Once | INTRAVENOUS | Status: AC
  Administered 2023-08-31: 5 10*6.[IU] via INTRAVENOUS
  Filled 2023-08-31: qty 5

## 2023-08-31 MED ORDER — DIBUCAINE (PERIANAL) 1 % EX OINT: 1.0000 | TOPICAL_OINTMENT | CUTANEOUS | Status: DC | PRN

## 2023-08-31 MED ORDER — OXYCODONE HCL 5 MG PO TABS: 10.0000 mg | ORAL_TABLET | ORAL | Status: DC | PRN

## 2023-08-31 MED ORDER — COCONUT OIL OIL: 1.0000 | TOPICAL_OIL | Status: DC | PRN

## 2023-08-31 MED ORDER — SIMETHICONE 80 MG PO CHEW: 80.0000 mg | CHEWABLE_TABLET | ORAL | Status: DC | PRN

## 2023-08-31 MED ORDER — FENTANYL-BUPIVACAINE-NACL 0.5-0.125-0.9 MG/250ML-% EP SOLN
12.0000 mL/h | EPIDURAL | Status: DC | PRN
  Administered 2023-08-31: 12 mL/h via EPIDURAL
  Filled 2023-08-31: qty 250

## 2023-08-31 MED ORDER — TRANEXAMIC ACID-NACL 1000-0.7 MG/100ML-% IV SOLN: 1000.0000 mg | INTRAVENOUS | Status: AC

## 2023-08-31 MED ORDER — ONDANSETRON HCL 4 MG/2ML IJ SOLN: 4.0000 mg | INTRAMUSCULAR | Status: DC | PRN

## 2023-08-31 MED ORDER — LACTATED RINGERS IV SOLN
500.0000 mL | Freq: Once | INTRAVENOUS | Status: AC
  Administered 2023-08-31: 500 mL via INTRAVENOUS

## 2023-08-31 MED ORDER — BENZOCAINE-MENTHOL 20-0.5 % EX AERO
1.0000 | INHALATION_SPRAY | CUTANEOUS | Status: DC | PRN
  Filled 2023-08-31 (×2): qty 56

## 2023-08-31 MED ORDER — LIDOCAINE HCL (PF) 1 % IJ SOLN
INTRAMUSCULAR | Status: DC | PRN
  Administered 2023-08-31 (×2): 4 mL via EPIDURAL

## 2023-08-31 MED ORDER — ZOLPIDEM TARTRATE 5 MG PO TABS: 5.0000 mg | ORAL_TABLET | Freq: Every evening | ORAL | Status: DC | PRN

## 2023-08-31 MED ORDER — OXYCODONE HCL 5 MG PO TABS
5.0000 mg | ORAL_TABLET | ORAL | Status: DC | PRN
  Administered 2023-09-02 (×2): 5 mg via ORAL
  Filled 2023-08-31 (×2): qty 1

## 2023-08-31 MED ORDER — CEFAZOLIN SODIUM-DEXTROSE 2-4 GM/100ML-% IV SOLN
2.0000 g | Freq: Once | INTRAVENOUS | Status: AC
  Administered 2023-08-31: 2 g via INTRAVENOUS
  Filled 2023-08-31: qty 100

## 2023-08-31 MED ORDER — ONDANSETRON HCL 4 MG/2ML IJ SOLN: 4.0000 mg | Freq: Four times a day (QID) | INTRAMUSCULAR | Status: DC | PRN

## 2023-08-31 MED ORDER — ONDANSETRON HCL 4 MG PO TABS: 4.0000 mg | ORAL_TABLET | ORAL | Status: DC | PRN

## 2023-08-31 MED ORDER — SENNOSIDES-DOCUSATE SODIUM 8.6-50 MG PO TABS
2.0000 | ORAL_TABLET | Freq: Every day | ORAL | Status: DC
  Administered 2023-09-01: 2 via ORAL
  Filled 2023-08-31: qty 2

## 2023-08-31 MED ORDER — PENICILLIN G POT IN DEXTROSE 60000 UNIT/ML IV SOLN
3.0000 10*6.[IU] | INTRAVENOUS | Status: DC
  Administered 2023-08-31: 3 10*6.[IU] via INTRAVENOUS
  Filled 2023-08-31: qty 50

## 2023-08-31 MED ORDER — DIPHENHYDRAMINE HCL 25 MG PO CAPS: 25.0000 mg | ORAL_CAPSULE | Freq: Four times a day (QID) | ORAL | Status: DC | PRN

## 2023-08-31 MED ORDER — OXYTOCIN BOLUS FROM INFUSION
333.0000 mL | Freq: Once | INTRAVENOUS | Status: AC
  Administered 2023-08-31: 333 mL via INTRAVENOUS

## 2023-08-31 MED ORDER — LACTATED RINGERS IV SOLN: INTRAVENOUS | Status: DC

## 2023-08-31 MED ORDER — FENTANYL CITRATE (PF) 100 MCG/2ML IJ SOLN
50.0000 ug | INTRAMUSCULAR | Status: DC | PRN
  Administered 2023-08-31: 100 ug via INTRAVENOUS
  Filled 2023-08-31: qty 2

## 2023-08-31 MED ORDER — TETANUS-DIPHTH-ACELL PERTUSSIS 5-2.5-18.5 LF-MCG/0.5 IM SUSY: 0.5000 mL | PREFILLED_SYRINGE | Freq: Once | INTRAMUSCULAR | Status: DC

## 2023-08-31 MED ORDER — EPHEDRINE 5 MG/ML INJ: 10.0000 mg | INTRAVENOUS | Status: DC | PRN

## 2023-08-31 MED ORDER — SOD CITRATE-CITRIC ACID 500-334 MG/5ML PO SOLN: 30.0000 mL | ORAL | Status: DC | PRN

## 2023-08-31 MED ORDER — LACTATED RINGERS IV SOLN: 500.0000 mL | INTRAVENOUS | Status: DC | PRN

## 2023-08-31 MED ORDER — PHENYLEPHRINE 80 MCG/ML (10ML) SYRINGE FOR IV PUSH (FOR BLOOD PRESSURE SUPPORT): 80.0000 ug | PREFILLED_SYRINGE | INTRAVENOUS | Status: DC | PRN

## 2023-08-31 MED ORDER — OXYCODONE-ACETAMINOPHEN 5-325 MG PO TABS: 1.0000 | ORAL_TABLET | ORAL | Status: DC | PRN

## 2023-08-31 MED ORDER — PRENATAL MULTIVITAMIN CH
1.0000 | ORAL_TABLET | Freq: Every day | ORAL | Status: DC
  Administered 2023-09-01 – 2023-09-02 (×2): 1 via ORAL
  Filled 2023-08-31 (×2): qty 1

## 2023-08-31 MED ORDER — WITCH HAZEL-GLYCERIN EX PADS: 1.0000 | MEDICATED_PAD | CUTANEOUS | Status: DC | PRN

## 2023-08-31 MED ORDER — IBUPROFEN 600 MG PO TABS
600.0000 mg | ORAL_TABLET | Freq: Four times a day (QID) | ORAL | Status: DC
  Administered 2023-08-31 – 2023-09-02 (×7): 600 mg via ORAL
  Filled 2023-08-31 (×7): qty 1

## 2023-08-31 NOTE — MAU Note (Signed)
 MAU Labor Triage Note: .Jamie Neal is a 28 y.o. at [redacted]w[redacted]d here in MAU reporting: EMS arrival due to CTX starting at 3am  Contractions every: 3 minutes Onset of ctx: 3am Pain Score: 10-Worst pain ever Pain Location: Abdomen  ROM: no Vaginal Bleeding: no Last SVE: 2cm Labor Pain Management Plan: Planning epidural  Fetal Movement: Reports positive FM FHT: 140   Vitals:   08/31/23 0730  BP: 115/72  Pulse: 82  Resp: 18  Temp: 98.1 F (36.7 C)  SpO2: 100%     OB Office: Faculty GBS: Patient unsure of results Lab orders placed from triage: N/A

## 2023-08-31 NOTE — Anesthesia Procedure Notes (Signed)
 Epidural Patient location during procedure: OB Start time: 08/31/2023 8:20 AM End time: 08/31/2023 8:23 AM  Staffing Anesthesiologist: Vernadine Golas, MD Performed: anesthesiologist   Preanesthetic Checklist Completed: patient identified, IV checked, risks and benefits discussed, monitors and equipment checked, pre-op evaluation and timeout performed  Epidural Patient position: sitting Prep: DuraPrep and site prepped and draped Patient monitoring: continuous pulse ox, blood pressure and heart rate Approach: midline Location: L3-L4 Injection technique: LOR air  Needle:  Needle type: Tuohy  Needle gauge: 17 G Needle length: 9 cm Needle insertion depth: 6 cm Catheter type: closed end flexible Catheter size: 19 Gauge Catheter at skin depth: 11 cm Test dose: negative and Other (1% lidocaine )  Assessment Events: blood not aspirated, no cerebrospinal fluid, injection not painful, no injection resistance, no paresthesia and negative IV test  Additional Notes Patient identified. Risks, benefits, and alternatives discussed with patient including but not limited to bleeding, infection, nerve damage, paralysis, failed block, incomplete pain control, headache, blood pressure changes, nausea, vomiting, reactions to medication, itching, and postpartum back pain. Confirmed with bedside nurse the patient's most recent platelet count. Confirmed with patient that they are not currently taking any anticoagulation, have any bleeding history, or any family history of bleeding disorders. Patient expressed understanding and wished to proceed. All questions were answered. Sterile technique was used throughout the entire procedure. Please see nursing notes for vital signs.   Crisp LOR on first pass. Test dose was given through epidural catheter and negative prior to continuing to dose epidural or start infusion. Warning signs of high block given to the patient including shortness of breath, tingling/numbness  in hands, complete motor block, or any concerning symptoms with instructions to call for help. Patient was given instructions on fall risk and not to get out of bed. All questions and concerns addressed with instructions to call with any issues or inadequate analgesia.  Reason for block:procedure for pain

## 2023-08-31 NOTE — Discharge Summary (Signed)
 Postpartum Discharge Summary  Date of Service updated***     Patient Name: Jamie Neal DOB: [birth year] MRN: [id number]  Date of admission: 08/31/2023 Delivery date:08/31/2023 Delivering provider: Hines Ludwig [place] Date of discharge: 08/31/2023  Admitting diagnosis: Normal labor [O80, Z37.9] Intrauterine pregnancy: [redacted]w[redacted]d     Secondary diagnosis:  Principal Problem:   Normal labor Active Problems:   Hx of cesarean section  Additional problems: ***    Discharge diagnosis: Term Pregnancy Delivered                                              Post partum procedures:{Postpartum procedures:23558} Augmentation: N/A Complications: recurrent decelerations requiring VAVD  Hospital course: Onset of Labor With Vaginal Delivery      28 y.o. yo [id number] at [redacted]w[redacted]d was admitted in Latent Labor on 08/31/2023. Labor course was complicated by recurrent prolonged and late decelerations once complete and pushing necessitating VAVD.    Membrane Rupture Time/Date: 9:30 AM,08/31/2023  Delivery Method:VBAC, Vacuum Assisted Operative Delivery:Device used:Kiwi Indication: Fetal indications Episiotomy: None Lacerations:  None Patient had a postpartum course complicated by ***.  She is ambulating, tolerating a regular diet, passing flatus, and urinating well. Patient is discharged home in stable condition on 08/31/23.  Newborn Data: Birth date:08/31/2023 Birth time:2:29 PM Gender:Female Living status:Living Apgars:8 ,9  Weight:3540 g  Magnesium Sulfate received: {Mag received:30440022} BMZ received: No Rhophylac:N/A MMR:N/A T-DaP:{Tdap:23962} Flu: {JYN:82956} RSV Vaccine received: {RSV:31013} Transfusion:{Transfusion received:30440034}  Immunizations received: There is no immunization history for the selected administration types on file for this patient.  Physical exam  Vitals:   08/31/23 1515 08/31/23 1538 08/31/23 1547 08/31/23 1602  BP: 115/77 115/77 120/76 113/77  Pulse: 60 62 66 60   Resp:      Temp:      TempSrc:      SpO2:      Weight:      Height:       General: {Exam; general:21111117} Lochia: {Desc; appropriate/inappropriate:30686::"appropriate"} Uterine Fundus: {Desc; firm/soft:30687} Incision: {Exam; incision:21111123} DVT Evaluation: {Exam; dvt:2111122} Labs: Lab Results  Component Value Date   WBC 9.2 08/31/2023   HGB 10.9 (L) 08/31/2023   HCT 32.0 (L) 08/31/2023   MCV 84.2 08/31/2023   PLT 190 08/31/2023      Latest Ref Rng & Units 08/31/2023    7:51 AM  CMP  Glucose 70 - 99 mg/dL 84   BUN 6 - 20 mg/dL 6   Creatinine 2.13 - 0.86 mg/dL 5.78   Sodium 469 - 629 mmol/L 138   Potassium 3.5 - 5.1 mmol/L 3.5   Chloride 98 - 111 mmol/L 105   CO2 22 - 32 mmol/L 19   Calcium 8.9 - 10.3 mg/dL 8.6   Total Protein 6.5 - 8.1 g/dL 6.6   Total Bilirubin 0.0 - 1.2 mg/dL 0.6   Alkaline Phos 38 - 126 U/L 109   AST 15 - 41 U/L 20   ALT 0 - 44 U/L 12    Edinburgh Score:    03/14/2020    4:59 PM  Edinburgh Postnatal Depression Scale Screening Tool  I have been able to laugh and see the funny side of things. 1  I have looked forward with enjoyment to things. 1  I have blamed myself unnecessarily when things went wrong. 0  I have been anxious or worried for no good reason. 2  I have felt scared or panicky for no good reason. 1  Things have been getting on top of me. 1  I have been so unhappy that I have had difficulty sleeping. 0  I have felt sad or miserable. 1  I have been so unhappy that I have been crying. 1  The thought of harming myself has occurred to me. 0  Edinburgh Postnatal Depression Scale Total 8   No data recorded  After visit meds:  Allergies as of 08/31/2023       Reactions   Bee Venom    Pork-derived Products    Cultural practice     Med Rec must be completed prior to using this [facility]***        Discharge home in stable condition Infant Feeding: {Baby feeding:23562} Infant Disposition:{CHL IP OB HOME WITH  WUJWJX:91478} Discharge instruction: per After Visit Summary and Postpartum booklet. Activity: Advance as tolerated. Pelvic rest for 6 weeks.  Diet: {OB GNFA:21308657} Future Appointments:No future appointments. Follow up Visit: Message to [facility] 5/2   Please schedule this patient for a In person postpartum visit in 4 weeks with the following provider: Any provider. Additional Postpartum F/U: n/a   High risk pregnancy complicated by:  HSV , Obesity, GBS bacteriuria Delivery mode:  VBAC, Vacuum Assisted Anticipated Birth Control:  Unsure   08/31/2023 Ebony Goldstein, MD

## 2023-08-31 NOTE — MAU Note (Incomplete)
 OBSTETRIC ADMISSION HISTORY AND PHYSICAL  Jamie Neal is a 28 y.o. female [phone] with IUP at [redacted]w[redacted]d by *** presenting for ***. She reports +FMs, No LOF, no VB, no blurry vision, headaches or peripheral edema, and RUQ pain.  She plans on *** feeding. She request *** for birth control. She received her prenatal care at {Blank single:19197::"[facility]","[facility]"}   Dating: By *** --->  Estimated Date of Delivery: 08/25/23  Sono:    @***w***d, CWD, normal anatomy, *** presentation, *** lie, ***g, ***% EFW   Prenatal History/Complications: ***  Past Medical History: Past Medical History:  Diagnosis Date   BV (bacterial vaginosis)    Missed period 12/21/2022   UTI (urinary tract infection)     Past Surgical History: Past Surgical History:  Procedure Laterality Date   CESAREAN SECTION  03/12/2020   Procedure: CESAREAN SECTION;  Surgeon: Abigail Abler, MD;  Location: [facility];  Service: Obstetrics;;    Obstetrical History: OB History     Gravida  4   Para  2   Term  2   Preterm      AB  1   Living  2      SAB  1   IAB  0   Ectopic  0   Multiple  0   Live Births  2           Social History Social History   Socioeconomic History   Marital status: Single    Spouse name: Not on file   Number of children: Not on file   Years of education: Not on file   Highest education level: Not on file  Occupational History   Not on file  Tobacco Use   Smoking status: Never   Smokeless tobacco: Never  Vaping Use   Vaping status: Former  Substance and Sexual Activity   Alcohol use: Not Currently    Comment: occasionally   Drug use: Never   Sexual activity: Yes    Birth control/protection: None  Other Topics Concern   Not on file  Social History Narrative   Not on file   Social Drivers of Health   Financial Resource Strain: Not on file  Food Insecurity: No Food Insecurity (03/03/2020)   Hunger Vital Sign    Worried About Running Out of  Food in the Last Year: Never true    Ran Out of Food in the Last Year: Never true  Transportation Needs: No Transportation Needs (03/03/2020)   PRAPARE - Administrator, Civil Service (Medical): No    Lack of Transportation (Non-Medical): No  Physical Activity: Not on file  Stress: Not on file  Social Connections: Not on file    Family History: Family History  Problem Relation Age of Onset   Healthy Mother    Diabetes Father    Hypertension Father     Allergies: Allergies  Allergen Reactions   Bee Venom    Pork-Derived Products     Cultural practice    Medications Prior to Admission  Medication Sig Dispense Refill Last Dose/Taking   multivitamin prental (TRINATAL) 60-1 MG TABS tablet Take 1 tablet by mouth daily. 30 tablet 12 08/30/2023   valACYclovir  (VALTREX ) 500 MG tablet Take 1 tablet (500 mg total) by mouth 2 (two) times daily. (Patient taking differently: Take 500 mg by mouth daily as needed (Outbreak).) 60 tablet 2 Past Week     Review of Systems   All systems reviewed and negative except as stated  in HPI  Blood pressure 115/72, pulse 82, temperature 98.1 F (36.7 C), temperature source Oral, resp. rate 18, height 5' (1.524 m), weight 87.1 kg, last menstrual period 10/30/2022, SpO2 100%. General appearance: {general exam:16600} Lungs: clear to auscultation bilaterally Heart: regular rate and rhythm Abdomen: soft, non-tender; bowel sounds normal Pelvic: *** Extremities: Homans sign is negative, no sign of DVT DTR's *** Presentation: {desc; fetal presentation:14558} Fetal monitoring{findings; monitor fetal heart monitor:31527} Uterine activity{Uterine contractions:31516}     Prenatal labs: ABO, Rh: O/Positive/-- (10/09 1631) Antibody: Negative (10/09 1631) Rubella: 1.65 (10/09 1631) RPR: Non Reactive (04/04 0921)  HBsAg: Negative (10/09 1631)  HIV: Non Reactive (04/04 0921)  GBS:      Lab Results  Component Value Date   GBS Negative  02/17/2020   GTT *** Genetic screening  *** Anatomy US  ***  There is no immunization history for the selected administration types on file for this patient.  Prenatal Transfer Tool  Maternal Diabetes: {Maternal Diabetes:3043596} Genetic Screening: {Genetic Screening:20205} Maternal Ultrasounds/Referrals: {Maternal Ultrasounds / Referrals:20211} Fetal Ultrasounds or other Referrals:  {Fetal Ultrasounds or Other Referrals:20213} Maternal Substance Abuse:  {Maternal Substance Abuse:20223} Significant Maternal Medications:  {Significant Maternal Meds:20233} Significant Maternal Lab Results: {Significant Maternal Lab Results:20235} Number of Prenatal Visits:{Prenatal Visits:27860} Maternal Vaccinations:{Maternal Immunizations:31012} Other Comments:  {Other Comments:20251}   No results found for this or any previous visit (from the past 24 hours).  Patient Active Problem List   Diagnosis Date Noted   GBS bacteriuria 03/28/2023   Supervision of high risk pregnancy, antepartum 02/07/2023   Herpes infection 02/25/2020   Obesity (BMI 30.0-34.9) 08/18/2019    Assessment/Plan:  Jamie Neal is a 28 y.o. [id number] at [redacted]w[redacted]d here for***  #Labor:*** #Pain: *** #FWB: *** #GBS status:  {gen pos AVW:098119} #Feeding: {Infant feeding:32067} #Reproductive Life planning: {Contraceptives:21111124} #Circ:  {yes/no/default n/a:21102::"not applicable"}  Darrow End, MD  08/31/2023, 7:47 AM

## 2023-08-31 NOTE — H&P (Signed)
 OBSTETRIC ADMISSION HISTORY AND PHYSICAL  Jamie Neal is a 28 y.o. female [phone] with IUP at [redacted]w[redacted]d presenting for SOL. Contractions started at 3am this morning. Also reports SROM at 0930. Clear fluid.  She reports +FMs, No LOF, no VB, no blurry vision, headaches or peripheral edema, and RUQ pain.  She plans on Breast feeding. She is unsure for method of birth control. She received her prenatal care at  [facility]     Dating: By US  ob 10/9 --->  Estimated Date of Delivery: 08/25/23  Sono:    @[redacted]w[redacted]d , CWD, normal anatomy, Cephalic presentation,  297g, 47% EFW   Prenatal History/Complications:  Patient Active Problem List   Diagnosis Date Noted   Normal labor 08/31/2023   Hx of cesarean section 08/31/2023   GBS bacteriuria 03/28/2023   Supervision of high risk pregnancy, antepartum 02/07/2023   Herpes infection 02/25/2020   Obesity (BMI 30.0-34.9) 08/18/2019     Past Medical History: Past Medical History:  Diagnosis Date   BV (bacterial vaginosis)    Missed period 12/21/2022   UTI (urinary tract infection)     Past Surgical History: Past Surgical History:  Procedure Laterality Date   CESAREAN SECTION  03/12/2020   Procedure: CESAREAN SECTION;  Surgeon: Abigail Abler, MD;  [facility]: [facility];  Service: Obstetrics;;    Obstetrical History: OB History     Gravida  4   Para  2   Term  2   Preterm      AB  1   Living  2      SAB  1   IAB  0   Ectopic  0   Multiple  0   Live Births  2           Social History Social History   Socioeconomic History   Marital status: Single    Spouse name: Not on file   Number of children: Not on file   Years of education: Not on file   Highest education level: Not on file  Occupational History   Not on file  Tobacco Use   Smoking status: Never   Smokeless tobacco: Never  Vaping Use   Vaping status: Former  Substance and Sexual Activity   Alcohol use: Not Currently    Comment: occasionally   Drug use:  Never   Sexual activity: Yes    Birth control/protection: None  Other Topics Concern   Not on file  Social History Narrative   Not on file   Social Drivers of Health   Financial Resource Strain: Not on file  Food Insecurity: No Food Insecurity (08/31/2023)   Hunger Vital Sign    Worried About Running Out of Food in the Last Year: Never true    Ran Out of Food in the Last Year: Never true  Transportation Needs: No Transportation Needs (08/31/2023)   PRAPARE - Administrator, Civil Service (Medical): No    Lack of Transportation (Non-Medical): No  Physical Activity: Not on file  Stress: Not on file  Social Connections: Not on file    Family History: Family History  Problem Relation Age of Onset   Healthy Mother    Diabetes Father    Hypertension Father     Allergies: Allergies  Allergen Reactions   Bee Venom    Pork-Derived Products     Cultural practice    Medications Prior to Admission  Medication Sig Dispense Refill Last Dose/Taking   multivitamin prental (TRINATAL) 60-1 MG  TABS tablet Take 1 tablet by mouth daily. 30 tablet 12 08/30/2023   valACYclovir  (VALTREX ) 500 MG tablet Take 1 tablet (500 mg total) by mouth 2 (two) times daily. (Patient taking differently: Take 500 mg by mouth daily as needed (Outbreak).) 60 tablet 2 Past Week     Review of Systems   All systems reviewed and negative except as stated in HPI  Blood pressure 112/66, pulse 71, temperature 98.1 F (36.7 C), temperature source Oral, resp. rate 18, height 5' (1.524 m), weight 87.1 kg, last menstrual period 10/30/2022, SpO2 100%. General appearance: alert, cooperative, and appears stated age Lungs: clear to auscultation bilaterally Heart: regular rate and rhythm Abdomen: soft, non-tender; bowel sounds normal Extremities: Homans sign is negative, no sign of DVT Presentation: cephalic Fetal monitoringBaseline: 125 bpm, Variability: Good {> 6 bpm), Accelerations: Reactive, and  Decelerations: Early Uterine activity regular every 3-7 Dilation: 5 Effacement (%): 90 Station: -1 Exam by:: N. Adele Admire, RN   Prenatal labs: ABO, Rh: --/--/PENDING (05/02 1610) Antibody: PENDING (05/02 0749) Rubella: 1.65 (10/09 1631) RPR: Non Reactive (04/04 0921)  HBsAg: Negative (10/09 1631)  HIV: Non Reactive (04/04 0921)  GBS:      Lab Results  Component Value Date   GBS Negative 02/17/2020  GBS positive in early Pregnancy.  GTT Normal  Genetic screening  NA    There is no immunization history for the selected administration types on file for this patient.  Prenatal Transfer Tool         NURSING  PROVIDER  Office Location [facility] Dating by 19 week u/s  [facility] Model Traditional Anatomy U/S WNL  Initiated care at  14wks                 Language  English               LAB RESULTS   Support Person   Genetics NIPS: LR female AFP:       NT/IT (FT only)        Carrier Screen Horizon:   Rhogam   A1C/GTT Early:  Third trimester: WNL  Flu Vaccine        TDaP Vaccine   Blood Type  O positive  Covid Vaccine  No Antibody  negative  RSV Vaccine   Rubella  immune  Feeding Plan undecided RPR NON REACTIVE (08/19 1945)  Contraception undecided HBsAg Negative (01/23 1044)  Circumcision Yes if female HIV Non Reactive (08/19 1945)  Pediatrician  [facility] HCVAb  negative  Prenatal Classes            Pap       Diagnosis  Date Value Ref Range Status  01/28/2021     Final    - Negative for Intraepithelial Lesions or Malignancy (NILM)  01/28/2021 - Benign reactive/reparative changes   Final    BTLConsent   GC/CT Initial:   36wks:    VBAC  Consent   GBS  + in urine           DME Rx [X]  BP cuff [ ]  Weight Scale Waterbirth  [ ]  Class [ ]  Consent [ ]  CNM visit  PHQ9 & GAD7 [X]  new OB [  ] 28 weeks  [  ] 36 weeks Induction  [ ]  Orders Entered [ ] Foley Y/N       Results for orders placed or performed during the hospital encounter of 08/31/23 (from the past 24  hours)  Type and screen [facility]   Collection Time: 08/31/23  7:49 AM  Result Value Ref Range   ABO/RH(D) PENDING    Antibody Screen PENDING    Sample Expiration      09/03/2023,2359 Performed at [facility], [address] [place], [place] [address]   CBC   Collection Time: 08/31/23  7:51 AM  Result Value Ref Range   WBC 9.2 4.0 - 10.5 K/uL   RBC 3.80 (L) 3.87 - 5.11 MIL/uL   Hemoglobin 10.9 (L) 12.0 - 15.0 g/dL   HCT 19.1 (L) 47.8 - 29.5 %   MCV 84.2 80.0 - 100.0 fL   MCH 28.7 26.0 - 34.0 pg   MCHC 34.1 30.0 - 36.0 g/dL   RDW 62.1 30.8 - 65.7 %   Platelets 190 150 - 400 K/uL   nRBC 0.0 0.0 - 0.2 %  Comprehensive metabolic panel   Collection Time: 08/31/23  7:51 AM  Result Value Ref Range   Sodium 138 135 - 145 mmol/L   Potassium 3.5 3.5 - 5.1 mmol/L   Chloride 105 98 - 111 mmol/L   CO2 19 (L) 22 - 32 mmol/L   Glucose, Bld 84 70 - 99 mg/dL   BUN 6 6 - 20 mg/dL   Creatinine, Ser 8.46 0.44 - 1.00 mg/dL   Calcium 8.6 (L) 8.9 - 10.3 mg/dL   Total Protein 6.6 6.5 - 8.1 g/dL   Albumin  2.7 (L) 3.5 - 5.0 g/dL   AST 20 15 - 41 U/L   ALT 12 0 - 44 U/L   Alkaline Phosphatase 109 38 - 126 U/L   Total Bilirubin 0.6 0.0 - 1.2 mg/dL   GFR, Estimated >96 >29 mL/min   Anion gap 14 5 - 15    Patient Active Problem List   Diagnosis Date Noted   Normal labor 08/31/2023   Hx of cesarean section 08/31/2023   GBS bacteriuria 03/28/2023   Supervision of high risk pregnancy, antepartum 02/07/2023   Herpes infection 02/25/2020   Obesity (BMI 30.0-34.9) 08/18/2019    Assessment/Plan:  Dylanie Bandemer is a 28 y.o. [id number] at 108w6d here for SOL at 3am this morning,   #Labor:Expectant management at this time  #Pain: Epidural.  #FWB: Cat II  #GBS status:  Positive getting PCN  #Feeding: Breastmilk  #Reproductive Life planning:  Undecided  #Circ: N/A baby girl "Mulani"  Patient desires to take placenta home. CNM agrees with strong recommendation to NOT  ingest given GBS status.   Martell Mcfadyen Maurie Southern) Marlys Singh, MSN, CNM  Center for [facility]  08/31/2023 11:52 AM

## 2023-08-31 NOTE — Anesthesia Preprocedure Evaluation (Signed)
Anesthesia Evaluation  Patient identified by MRN, date of birth, ID band Patient awake    Reviewed: Allergy & Precautions, Patient's Chart, lab work & pertinent test results  History of Anesthesia Complications Negative for: history of anesthetic complications  Airway Mallampati: II  TM Distance: >3 FB Neck ROM: Full    Dental no notable dental hx.    Pulmonary neg pulmonary ROS   Pulmonary exam normal        Cardiovascular negative cardio ROS Normal cardiovascular exam     Neuro/Psych negative neurological ROS  negative psych ROS   GI/Hepatic negative GI ROS, Neg liver ROS,,,  Endo/Other  negative endocrine ROS    Renal/GU negative Renal ROS  negative genitourinary   Musculoskeletal negative musculoskeletal ROS (+)    Abdominal   Peds  Hematology negative hematology ROS (+)   Anesthesia Other Findings Day of surgery medications reviewed with patient.  Reproductive/Obstetrics (+) Pregnancy (Hx of C/S x1)                             Anesthesia Physical Anesthesia Plan  ASA: 3  Anesthesia Plan: Epidural   Post-op Pain Management:    Induction:   PONV Risk Score and Plan: Treatment may vary due to age or medical condition  Airway Management Planned: Natural Airway  Additional Equipment: Fetal Monitoring  Intra-op Plan:   Post-operative Plan:   Informed Consent: I have reviewed the patients History and Physical, chart, labs and discussed the procedure including the risks, benefits and alternatives for the proposed anesthesia with the patient or authorized representative who has indicated his/her understanding and acceptance.       Plan Discussed with:   Anesthesia Plan Comments:         Anesthesia Quick Evaluation

## 2023-09-01 NOTE — Anesthesia Postprocedure Evaluation (Signed)
 Anesthesia Post Note  Patient: Jamie Neal  Procedure(s) Performed: AN AD HOC LABOR EPIDURAL     Patient location during evaluation: Mother Baby Anesthesia Type: Epidural Level of consciousness: awake and alert Pain management: pain level controlled Vital Signs Assessment: post-procedure vital signs reviewed and stable Respiratory status: spontaneous breathing, nonlabored ventilation and respiratory function stable Cardiovascular status: stable Postop Assessment: no headache, no backache, epidural receding and able to ambulate Anesthetic complications: no   No notable events documented.  Last Vitals:  Vitals:   08/31/23 2331 09/01/23 0530  BP: 102/61 102/72  Pulse: 70 74  Resp: 18 18  Temp: 36.8 C   SpO2: 100% 100%    Last Pain:  Vitals:   09/01/23 0530  TempSrc: Oral  PainSc: 4    Pain Goal:                   Purvis Sidle

## 2023-09-01 NOTE — Progress Notes (Addendum)
 Post Partum Day 1 Subjective: no complaints, up ad lib, and tolerating PO  Objective: Blood pressure 102/72, pulse 74, temperature 98.2 F (36.8 C), temperature source Oral, resp. rate 18, height 5' (1.524 m), weight 87.1 kg, last menstrual period 10/30/2022, SpO2 100%, unknown if currently breastfeeding.  Physical Exam:  General: alert, cooperative, and no distress Lochia: appropriate Uterine Fundus: firm Incision:  DVT Evaluation: No evidence of DVT seen on physical exam.  Recent Labs    08/31/23 0751  HGB 10.9*  HCT 32.0*    Assessment/Plan: Routine PPC   LOS: 1 day   Onnie Bilis, MD 09/01/2023, 9:19 AM

## 2023-09-02 MED ORDER — OXYCODONE HCL 10 MG PO TABS: 5.0000 mg | ORAL_TABLET | ORAL | 0 refills | Status: DC | PRN

## 2023-09-02 MED ORDER — POLYETHYLENE GLYCOL 3350 17 GM/SCOOP PO POWD: 17.0000 g | Freq: Every day | ORAL | 0 refills | Status: DC | PRN

## 2023-09-02 MED ORDER — POLYETHYLENE GLYCOL 3350 17 GM/SCOOP PO POWD: 119.0000 g | Freq: Every day | ORAL | 0 refills | Status: DC | PRN

## 2023-09-02 MED ORDER — ACETAMINOPHEN 325 MG PO TABS: 650.0000 mg | ORAL_TABLET | ORAL | 0 refills | Status: DC | PRN

## 2023-09-02 MED ORDER — IBUPROFEN 600 MG PO TABS: 600.0000 mg | ORAL_TABLET | Freq: Four times a day (QID) | ORAL | 0 refills | Status: DC

## 2023-09-02 MED ORDER — NORETHINDRONE 0.35 MG PO TABS: 1.0000 | ORAL_TABLET | Freq: Every day | ORAL | 11 refills | Status: DC

## 2023-09-02 MED ORDER — SENNOSIDES-DOCUSATE SODIUM 8.6-50 MG PO TABS: 2.0000 | ORAL_TABLET | Freq: Every day | ORAL | 0 refills | Status: DC

## 2023-09-17 ENCOUNTER — Telehealth (HOSPITAL_COMMUNITY): Payer: Self-pay | Admitting: *Deleted

## 2023-09-17 NOTE — Telephone Encounter (Signed)
 09/17/2023  Name: Jamie Neal MRN: [id number] DOB: 1996/04/07  Reason for Call:  Transition of Care Hospital Discharge Call  Contact Status: Patient Contact Status: Complete  Language assistant needed:          Follow-Up Questions: Do You Have Any Concerns About Your Health As You Heal From Delivery?: No Do You Have Any Concerns About Your Infants Health?: No  Edinburgh Postnatal Depression Scale:  In the Past 7 Days:   EPDS declined at this time. Patient stated, "I'm doing OK. My 3 kids keep me on my toes." PHQ2-9 Depression Scale:     Discharge Follow-up: Edinburgh score requires follow up?: N/A Patient was advised of the following resources:: Breastfeeding Support Group, Support Group  Post-discharge interventions: Reviewed Newborn Safe Sleep Practices  Signature Julien Odor, RN, 09/17/23, [phone]

## 2023-10-01 ENCOUNTER — Encounter: Payer: Self-pay | Admitting: Obstetrics and Gynecology

## 2023-10-01 ENCOUNTER — Ambulatory Visit (INDEPENDENT_AMBULATORY_CARE_PROVIDER_SITE_OTHER): Admitting: Obstetrics and Gynecology

## 2023-10-01 VITALS — BP 117/67 | HR 85 | Ht 60.0 in | Wt 180.0 lb

## 2023-10-01 DIAGNOSIS — Z113 Encounter for screening for infections with a predominantly sexual mode of transmission: Secondary | ICD-10-CM | POA: Diagnosis not present

## 2023-10-01 NOTE — Progress Notes (Signed)
 Post Partum Visit Note  Jamie Neal is a 28 y.o. [phone] female who presents for a postpartum visit. She is 4 week postpartum following a vacuum-assisted vaginal delivery.  I have fully reviewed the prenatal and intrapartum course. The delivery was at [redacted]wks gestational weeks.  Anesthesia: epidural. Postpartum course has been good. Baby is doing well. Baby is feeding by bottle - Similac Advance. Bleeding on menses. Bowel function is normal. Bladder function is normal. Patient is not sexually active. Contraception method is oral progesterone-only contraceptive. Postpartum depression screening: negative.   Upstream - 10/01/23 1524       Pregnancy Intention Screening   Does the patient want to become pregnant in the next year? Unsure    Does the patient's partner want to become pregnant in the next year? Unsure    Would the patient like to discuss contraceptive options today? Yes      Contraception Wrap Up   Current Method Oral Contraceptive    End Method Oral Contraceptive    Contraception Counseling Provided Yes            The pregnancy intention screening data noted above was reviewed. Potential methods of contraception were discussed. The patient elected to proceed with Oral Contraceptive.   Edinburgh Postnatal Depression Scale - 10/01/23 1522       Edinburgh Postnatal Depression Scale:  In the Past 7 Days   I have been able to laugh and see the funny side of things. 0    I have looked forward with enjoyment to things. 0    I have blamed myself unnecessarily when things went wrong. 0    I have been anxious or worried for no good reason. 2    I have felt scared or panicky for no good reason. 0    Things have been getting on top of me. 0    I have been so unhappy that I have had difficulty sleeping. 0    I have felt sad or miserable. 0    I have been so unhappy that I have been crying. 0    The thought of harming myself has occurred to me. 0    Edinburgh Postnatal  Depression Scale Total 2             Health Maintenance Due  Topic Date Due   DTaP/Tdap/Td (1 - Tdap) Never done   COVID-19 Vaccine (1 - 2024-25 season) Never done    The following portions of the patient's history were reviewed and updated as appropriate: allergies, current medications, past family history, past medical history, past social history, past surgical history, and problem list.  Review of Systems Pertinent items are noted in HPI.  Objective:  BP 117/67   Pulse 85   Ht 5' (1.524 m)   Wt 180 lb (81.6 kg)   LMP 09/26/2023   Breastfeeding No   BMI 35.15 kg/m    General:  alert, cooperative, and no distress   Breasts:  not indicated  Lungs: Normal effort  Heart:  Normal rate  Abdomen: Soft, non tender   Wound N/a  GU exam:  Not indicated       Assessment:   Normal postpartum exam  Plan:   Essential components of care per ACOG recommendations:  1.  Mood and well being: Patient with negative depression screening today. Reviewed local resources for support.  - Patient tobacco use? No.   - hx of drug use? No.    2. Infant care  and feeding:  -Patient currently breastmilk feeding? No.  -Social determinants of health (SDOH) reviewed in EPIC. No concerns  3. Sexuality, contraception and birth spacing - Patient does not know want a pregnancy in the next year.  Desired family size is unsure children.  - Reviewed reproductive life planning. Reviewed contraceptive methods based on pt preferences and effectiveness.  Patient desired Oral Contraceptive today.   - Discussed birth spacing of 18 months  4. Sleep and fatigue -Encouraged family/partner/community support of 4 hrs of uninterrupted sleep to help with mood and fatigue  5. Physical Recovery  - Discussed patients delivery and complications. She describes her labor as good. - Patient had a VAVD/VBAC. Patient had no laceration. Perineal healing reviewed. Patient expressed understanding - Patient has  urinary incontinence? No.Notes episode of fecal incontinence but resolved - Patient is safe to resume physical and sexual activity  6.  Health Maintenance - HM due items addressed Yes - considering tdap - Last pap smear  Diagnosis  Date Value Ref Range Status  01/28/2021   Final   - Negative for Intraepithelial Lesions or Malignancy (NILM)  01/28/2021 - Benign reactive/reparative changes  Final   Pap smear not done at today's visit. Pt opted to defer to next visit -Breast Cancer screening indicated? No.   7. Chronic Disease/Pregnancy Condition follow up: None  Izell Marsh, MD Center for [employer]

## 2023-10-03 LAB — URINE CYTOLOGY ANCILLARY ONLY
Chlamydia: NEGATIVE
Comment: NEGATIVE
Comment: NEGATIVE
Comment: NORMAL
Neisseria Gonorrhea: NEGATIVE
Trichomonas: NEGATIVE

## 2023-11-15 ENCOUNTER — Ambulatory Visit: Admitting: Obstetrics and Gynecology

## 2023-11-16 ENCOUNTER — Encounter (HOSPITAL_COMMUNITY): Payer: Self-pay

## 2023-11-16 ENCOUNTER — Ambulatory Visit (HOSPITAL_COMMUNITY)
Admission: EM | Admit: 2023-11-16 | Discharge: 2023-11-16 | Disposition: A | Discharge status: Home / Self Care | Attending: Family Medicine | Admitting: Family Medicine

## 2023-11-16 ENCOUNTER — Other Ambulatory Visit: Payer: Self-pay

## 2023-11-16 DIAGNOSIS — R300 Dysuria: Secondary | ICD-10-CM | POA: Diagnosis present

## 2023-11-16 DIAGNOSIS — N760 Acute vaginitis: Secondary | ICD-10-CM | POA: Diagnosis present

## 2023-11-16 LAB — POCT URINALYSIS DIP (MANUAL ENTRY)
Bilirubin, UA: NEGATIVE
Glucose, UA: NEGATIVE mg/dL
Ketones, POC UA: NEGATIVE mg/dL
Nitrite, UA: NEGATIVE
Protein Ur, POC: NEGATIVE mg/dL
Spec Grav, UA: 1.02 (ref 1.010–1.025)
Urobilinogen, UA: 1 U/dL
pH, UA: 6 (ref 5.0–8.0)

## 2023-11-16 LAB — POCT URINE PREGNANCY: Preg Test, Ur: NEGATIVE

## 2023-11-16 MED ORDER — METRONIDAZOLE 500 MG PO TABS
500.0000 mg | ORAL_TABLET | Freq: Two times a day (BID) | ORAL | 0 refills | Status: AC
  Filled 2023-11-16: qty 14, 7d supply, fill #0

## 2023-11-16 MED ORDER — FLUCONAZOLE 150 MG PO TABS
150.0000 mg | ORAL_TABLET | ORAL | 0 refills | Status: AC
  Filled 2023-11-16: qty 2, 6d supply, fill #0

## 2023-11-16 NOTE — ED Triage Notes (Addendum)
 Patient presenting with vaginal itching and brown discharge onset 1 week.  Prescriptions or OTC medications tried: Yes- Monistat  3   with no relief  Patient also requesting refill of birth control pills.

## 2023-11-16 NOTE — ED Provider Notes (Signed)
 [facility]    CSN: [id number] Arrival date & time: 11/16/23  0920      History   Chief Complaint Chief Complaint  Patient presents with   Vaginal Discharge    HPI Jamie Neal is a 28 y.o. female.    Vaginal Discharge  Here for vaginal itching and brown spotting dc x about 1 week. OTC monistat  has not helped.   Wants to switch birth control.  Some urgency and frequency. A little dysuria  Some LLQ pain. No back pain.  NKDA  Denies recent sexual activity  LMP 7/1 Past Medical History:  Diagnosis Date   BV (bacterial vaginosis)    Missed period 12/21/2022   UTI (urinary tract infection)     There are no active problems to display for this patient.   Past Surgical History:  Procedure Laterality Date   CESAREAN SECTION  03/12/2020   Procedure: CESAREAN SECTION;  Surgeon: Zina Jerilynn LABOR, MD;  Location: [facility] LD [facility]S;  Service: Obstetrics;;    OB History     Gravida  4   Para  3   Term  3   Preterm      AB  1   Living  3      SAB  1   IAB  0   Ectopic  0   Multiple  0   Live Births  3            Home Medications    Prior to Admission medications   Medication Sig Start Date End Date Taking? Authorizing Provider  fluconazole  (DIFLUCAN ) 150 MG tablet Take 1 tablet (150 mg total) by mouth every 3 (three) days for 2 doses. 11/16/23 11/20/23 Yes Vonna Sharlet POUR, MD  metroNIDAZOLE  (FLAGYL ) 500 MG tablet Take 1 tablet (500 mg total) by mouth 2 (two) times daily for 7 days. 11/16/23 11/23/23 Yes Perris Tripathi, Sharlet POUR, MD  norethindrone  (ORTHO MICRONOR ) 0.35 MG tablet Take 1 tablet (0.35 mg total) by mouth daily. 09/02/23 09/01/24 Yes Jhonny Augustin BROCKS, MD  acetaminophen  (TYLENOL ) 325 MG tablet Take 2 tablets (650 mg total) by mouth every 4 (four) hours as needed (for pain scale < 4). Patient not taking: Reported on 10/01/2023 09/02/23   Jhonny Augustin BROCKS, MD  ibuprofen  (ADVIL ) 600 MG tablet Take 1 tablet (600 mg total) by mouth every 6 (six)  hours. Patient not taking: Reported on 10/01/2023 09/02/23   Jhonny Augustin BROCKS, MD  multivitamin prental (TRINATAL) 60-1 MG TABS tablet Take 1 tablet by mouth daily. Patient not taking: Reported on 10/01/2023 02/07/23   Ervin, Michael L, MD  oxyCODONE  10 MG TABS Take 0.5 tablets (5 mg total) by mouth every 4 (four) hours as needed (pain scale > 7). Patient not taking: Reported on 10/01/2023 09/02/23   Jhonny Augustin BROCKS, MD  polyethylene glycol powder (MIRALAX ) 17 GM/SCOOP powder Take 17 g by mouth daily as needed for mild constipation. Patient not taking: Reported on 10/01/2023 09/02/23   Kandis Devaughn Sayres, MD  senna-docusate (SENOKOT-S) 8.6-50 MG tablet Take 2 tablets by mouth daily. Patient not taking: Reported on 10/01/2023 09/02/23   Jhonny Augustin BROCKS, MD  valACYclovir  (VALTREX ) 500 MG tablet Take 1 tablet (500 mg total) by mouth 2 (two) times daily. Patient taking differently: Take 500 mg by mouth daily as needed (Outbreak). 08/03/23   Davis, Devon E, PA-C    Family History Family History  Problem Relation Age of Onset   Healthy Mother    Diabetes Father  Hypertension Father     Social History Social History   Tobacco Use   Smoking status: Never   Smokeless tobacco: Never  Vaping Use   Vaping status: Former  Substance Use Topics   Alcohol use: Not Currently    Comment: occasionally   Drug use: Never     Allergies   Bee venom and Pork-derived products   Review of Systems Review of Systems  Genitourinary:  Positive for vaginal discharge.     Physical Exam Triage Vital Signs ED Triage Vitals  Encounter Vitals Group     BP 11/16/23 1017 109/72     Girls Systolic BP Percentile --      Girls Diastolic BP Percentile --      Boys Systolic BP Percentile --      Boys Diastolic BP Percentile --      Pulse Rate 11/16/23 1017 80     Resp 11/16/23 1017 18     Temp 11/16/23 1017 98.4 F (36.9 C)     Temp Source 11/16/23 1017 Oral     SpO2 11/16/23 1017 98 %     Weight 11/16/23 1016 167 lb  (75.8 kg)     Height 11/16/23 1016 5' (1.524 m)     Head Circumference --      Peak Flow --      Pain Score 11/16/23 1014 3     Pain Loc --      Pain Education --      Exclude from Growth Chart --    No data found.  Updated Vital Signs BP 109/72 (BP Location: Right Arm)   Pulse 80   Temp 98.4 F (36.9 C) (Oral)   Resp 18   Ht 5' (1.524 m)   Wt 75.8 kg   LMP 10/30/2023 (Approximate)   SpO2 98%   Breastfeeding No   BMI 32.61 kg/m   Visual Acuity Right Eye Distance:   Left Eye Distance:   Bilateral Distance:    Right Eye Near:   Left Eye Near:    Bilateral Near:     Physical Exam   UC Treatments / Results  Labs (all labs ordered are listed, but only abnormal results are displayed) Labs Reviewed  POCT URINALYSIS DIP (MANUAL ENTRY) - Abnormal; Notable for the following components:      Result Value   Blood, UA large (*)    Leukocytes, UA Moderate (2+) (*)    All other components within normal limits  POCT URINE PREGNANCY  CERVICOVAGINAL ANCILLARY ONLY    EKG   Radiology No results found.  Procedures Procedures (including critical care time)  Medications Ordered in UC Medications - No data to display  Initial Impression / Assessment and Plan / UC Course  I have reviewed the triage vital signs and the nursing notes.  Pertinent labs & imaging results that were available during my care of the patient were reviewed by me and considered in my medical decision making (see chart for details).     UA shows RBC and WBC, no nitrites.  UPT negative.  Urine culture is sent and staff will notify her if she needs an antibiotic for UTI.  Vaginal self swab is done, and we will notify of any positives on that and treat per protocol.  Fluconazole  is sent and for possible yeast and metronidazole  sent in for possible BV  I have asked her to speak to her prescribing provider about her desire to switch birth control. Final Clinical Impressions(s) / UC  Diagnoses    Final diagnoses:  Acute vaginitis  Dysuria     Discharge Instructions      Pregnancy test was negative  Urinalysis had some red blood cells and white blood cells, which could be from the discharge.  Urine culture is sent to staff will notify if it looks like you need an antibiotic for urinary tract infection  Staff will notify you if there is anything positive on the swab (or on the blood work if that has been taken at this visit). It can take 2-3 days for the tests to result, depending on the day of the week your test was taken. You will only be notified if there are any positives on the testing; test results will also go to your MyChart if you are signed up for MyChart.   Take fluconazole  150 mg--1 tablet every 3 days for 3 doses  Take metronidazole  500 mg--1 tablet 2 times daily for 7 days.  Avoid drinking alcohol within 72 hours of taking this medication       ED Prescriptions     Medication Sig Dispense Auth. Provider   fluconazole  (DIFLUCAN ) 150 MG tablet Take 1 tablet (150 mg total) by mouth every 3 (three) days for 2 doses. 2 tablet Vonna Sharlet POUR, MD   metroNIDAZOLE  (FLAGYL ) 500 MG tablet Take 1 tablet (500 mg total) by mouth 2 (two) times daily for 7 days. 14 tablet Ervine Witucki K, MD      PDMP not reviewed this encounter.   Vonna Sharlet POUR, MD 11/16/23 1057

## 2023-11-16 NOTE — Discharge Instructions (Signed)
 Pregnancy test was negative  Urinalysis had some red blood cells and white blood cells, which could be from the discharge.  Urine culture is sent to staff will notify if it looks like you need an antibiotic for urinary tract infection  Staff will notify you if there is anything positive on the swab (or on the blood work if that has been taken at this visit). It can take 2-3 days for the tests to result, depending on the day of the week your test was taken. You will only be notified if there are any positives on the testing; test results will also go to your MyChart if you are signed up for MyChart.   Take fluconazole  150 mg--1 tablet every 3 days for 3 doses  Take metronidazole  500 mg--1 tablet 2 times daily for 7 days.  Avoid drinking alcohol within 72 hours of taking this medication

## 2023-11-19 ENCOUNTER — Ambulatory Visit (HOSPITAL_COMMUNITY): Payer: Self-pay

## 2023-11-19 LAB — CERVICOVAGINAL ANCILLARY ONLY
Bacterial Vaginitis (gardnerella): NEGATIVE
Candida Glabrata: NEGATIVE
Candida Vaginitis: POSITIVE — AB
Chlamydia: NEGATIVE
Comment: NEGATIVE
Comment: NEGATIVE
Comment: NEGATIVE
Comment: NEGATIVE
Comment: NEGATIVE
Comment: NORMAL
Neisseria Gonorrhea: NEGATIVE
Trichomonas: NEGATIVE

## 2023-11-20 ENCOUNTER — Other Ambulatory Visit: Payer: Self-pay

## 2024-01-01 ENCOUNTER — Encounter (HOSPITAL_COMMUNITY): Payer: Self-pay | Admitting: *Deleted

## 2024-01-01 ENCOUNTER — Ambulatory Visit (HOSPITAL_COMMUNITY)
Admission: EM | Admit: 2024-01-01 | Discharge: 2024-01-01 | Disposition: A | Discharge status: Home / Self Care | Attending: Emergency Medicine | Admitting: Emergency Medicine

## 2024-01-01 DIAGNOSIS — N898 Other specified noninflammatory disorders of vagina: Secondary | ICD-10-CM | POA: Insufficient documentation

## 2024-01-01 DIAGNOSIS — Z3202 Encounter for pregnancy test, result negative: Secondary | ICD-10-CM | POA: Insufficient documentation

## 2024-01-01 DIAGNOSIS — B9689 Other specified bacterial agents as the cause of diseases classified elsewhere: Secondary | ICD-10-CM | POA: Diagnosis not present

## 2024-01-01 DIAGNOSIS — Z113 Encounter for screening for infections with a predominantly sexual mode of transmission: Secondary | ICD-10-CM | POA: Diagnosis not present

## 2024-01-01 DIAGNOSIS — N926 Irregular menstruation, unspecified: Secondary | ICD-10-CM | POA: Insufficient documentation

## 2024-01-01 DIAGNOSIS — N760 Acute vaginitis: Secondary | ICD-10-CM | POA: Insufficient documentation

## 2024-01-01 LAB — HIV ANTIBODY (ROUTINE TESTING W REFLEX): HIV Screen 4th Generation wRfx: NONREACTIVE

## 2024-01-01 LAB — HCG, QUANTITATIVE, PREGNANCY: hCG, Beta Chain, Quant, S: <1 m[IU]/mL (ref <5)

## 2024-01-01 LAB — POCT URINE PREGNANCY: Preg Test, Ur: NEGATIVE

## 2024-01-01 NOTE — ED Triage Notes (Addendum)
 Pt states she wants blood pregnancy testing LMP was 11/26/2023. She is having fatigue, nausea, and irritated. Pt is on birth control. Recently had a baby 08/2023  She wants STI testing, she is having some strong vaginal smell and discharge. She would like cyto and blood work

## 2024-01-01 NOTE — ED Provider Notes (Signed)
 [facility]    CSN: [id number] Arrival date & time: 01/01/24  1126      History   Chief Complaint Chief Complaint  Patient presents with   Possible Pregnancy    HPI Jamie Neal is a 28 y.o. female.   Patient presents requesting blood pregnancy testing.  LMP 7/28.  Patient states that she had a pregnancy test at home and it was negative, but she states that she has been fatigued, nauseous, and irritable and states that she has felt like this in the past when she was pregnant.  Patient did recently have a baby in May of this year.  Patient states that even if the urine pregnancy test is negative she would like blood testing to be sure.  Patient reports that she is also on birth control at this time.  Patient states that she is also having malodorous vaginal discharge over the last few days.  Denies vaginal pain, itching, lesions, and abnormal vaginal bleeding.  Denies dysuria, hematuria, urinary frequency/urgency, abdominal pain, flank pain, and fever.  Denies any known exposures to STDs.  The history is provided by the patient and medical records.  Possible Pregnancy    Past Medical History:  Diagnosis Date   BV (bacterial vaginosis)    Missed period 12/21/2022   UTI (urinary tract infection)     There are no active problems to display for this patient.   Past Surgical History:  Procedure Laterality Date   CESAREAN SECTION  03/12/2020   Procedure: CESAREAN SECTION;  Surgeon: Zina Jerilynn LABOR, MD;  Location: [facility];  Service: Obstetrics;;    OB History     Gravida  4   Para  3   Term  3   Preterm      AB  1   Living  3      SAB  1   IAB  0   Ectopic  0   Multiple  0   Live Births  3            Home Medications    Prior to Admission medications   Medication Sig Start Date End Date Taking? Authorizing Provider  norethindrone  (ORTHO MICRONOR ) 0.35 MG tablet Take 1 tablet (0.35 mg total) by mouth daily. 09/02/23 09/01/24 Yes Jhonny Augustin BROCKS, MD  acetaminophen  (TYLENOL ) 325 MG tablet Take 2 tablets (650 mg total) by mouth every 4 (four) hours as needed (for pain scale < 4). Patient not taking: Reported on 10/01/2023 09/02/23   Jhonny Augustin BROCKS, MD  ibuprofen  (ADVIL ) 600 MG tablet Take 1 tablet (600 mg total) by mouth every 6 (six) hours. Patient not taking: Reported on 10/01/2023 09/02/23   Jhonny Augustin BROCKS, MD  multivitamin prental (TRINATAL) 60-1 MG TABS tablet Take 1 tablet by mouth daily. Patient not taking: Reported on 10/01/2023 02/07/23   Ervin, Michael L, MD  oxyCODONE  10 MG TABS Take 0.5 tablets (5 mg total) by mouth every 4 (four) hours as needed (pain scale > 7). Patient not taking: Reported on 10/01/2023 09/02/23   Jhonny Augustin BROCKS, MD  polyethylene glycol powder (MIRALAX ) 17 GM/SCOOP powder Take 17 g by mouth daily as needed for mild constipation. Patient not taking: Reported on 10/01/2023 09/02/23   Kandis Devaughn Sayres, MD  senna-docusate (SENOKOT-S) 8.6-50 MG tablet Take 2 tablets by mouth daily. Patient not taking: Reported on 10/01/2023 09/02/23   Jhonny Augustin BROCKS, MD  valACYclovir  (VALTREX ) 500 MG tablet Take 1 tablet (500 mg total) by  mouth 2 (two) times daily. Patient taking differently: Take 500 mg by mouth daily as needed (Outbreak). 08/03/23   Nicholaus Jorene BRAVO, PA-C    Family History Family History  Problem Relation Age of Onset   Healthy Mother    Diabetes Father    Hypertension Father     Social History Social History   Tobacco Use   Smoking status: Never   Smokeless tobacco: Never  Vaping Use   Vaping status: Former  Substance Use Topics   Alcohol use: Not Currently    Comment: occasionally   Drug use: Never     Allergies   Bee venom and Pork-derived products   Review of Systems Review of Systems  Per HPI  Physical Exam Triage Vital Signs ED Triage Vitals  Encounter Vitals Group     BP 01/01/24 1304 102/69     Girls Systolic BP Percentile --      Girls Diastolic BP Percentile --      Boys Systolic BP  Percentile --      Boys Diastolic BP Percentile --      Pulse Rate 01/01/24 1304 84     Resp 01/01/24 1304 16     Temp 01/01/24 1304 97.7 F (36.5 C)     Temp Source 01/01/24 1304 Oral     SpO2 01/01/24 1304 98 %     Weight --      Height --      Head Circumference --      Peak Flow --      Pain Score 01/01/24 1302 0     Pain Loc --      Pain Education --      Exclude from Growth Chart --    No data found.  Updated Vital Signs BP 102/69 (BP Location: Right Arm)   Pulse 84   Temp 97.7 F (36.5 C) (Oral)   Resp 16   LMP 11/26/2023 (Exact Date)   SpO2 98%   Visual Acuity Right Eye Distance:   Left Eye Distance:   Bilateral Distance:    Right Eye Near:   Left Eye Near:    Bilateral Near:     Physical Exam Vitals and nursing note reviewed.  Constitutional:      General: She is awake. She is not in acute distress.    Appearance: Normal appearance. She is well-developed and well-groomed. She is not ill-appearing.  Abdominal:     General: Abdomen is flat. Bowel sounds are normal. There is no distension.     Palpations: Abdomen is soft.     Tenderness: There is no abdominal tenderness. There is no right CVA tenderness, left CVA tenderness, guarding or rebound.  Genitourinary:    Comments: Exam deferred Skin:    General: Skin is warm and dry.  Neurological:     Mental Status: She is alert.  Psychiatric:        Behavior: Behavior is cooperative.      UC Treatments / Results  Labs (all labs ordered are listed, but only abnormal results are displayed) Labs Reviewed  HCG, QUANTITATIVE, PREGNANCY  HIV ANTIBODY (ROUTINE TESTING W REFLEX)  RPR  POCT URINE PREGNANCY  CERVICOVAGINAL ANCILLARY ONLY    EKG   Radiology No results found.  Procedures Procedures (including critical care time)  Medications Ordered in UC Medications - No data to display  Initial Impression / Assessment and Plan / UC Course  I have reviewed the triage vital signs and the nursing  notes.  Pertinent labs & imaging results that were available during my care of the patient were reviewed by me and considered in my medical decision making (see chart for details).     Patient is overall well-appearing.  Vitals are stable.  UPT negative, ordered hCG quantitative per patient request.  GU exam deferred.  Patient perform self swab for STD/STI.  HIV and RPR ordered.  Discussed follow-up and return precautions. Final Clinical Impressions(s) / UC Diagnoses   Final diagnoses:  Missed period  Urine pregnancy test negative  Vaginal discharge  Screening for STD (sexually transmitted disease)     Discharge Instructions      Your results will come back over the next few days and someone will call if results are positive or require any treatment. Follow-up with your primary care provider or return here as needed.   ED Prescriptions   None    PDMP not reviewed this encounter.   Johnie Flaming A, NP 01/01/24 1324

## 2024-01-01 NOTE — Discharge Instructions (Addendum)
 Your results will come back over the next few days and someone will call if results are positive or require any treatment. Follow-up with your primary care provider or return here as needed.

## 2024-01-02 ENCOUNTER — Ambulatory Visit (HOSPITAL_COMMUNITY): Payer: Self-pay

## 2024-01-02 ENCOUNTER — Other Ambulatory Visit: Payer: Self-pay

## 2024-01-02 LAB — RPR: RPR Ser Ql: NONREACTIVE

## 2024-01-02 LAB — CERVICOVAGINAL ANCILLARY ONLY
Bacterial Vaginitis (gardnerella): POSITIVE — AB
Candida Glabrata: NEGATIVE
Candida Vaginitis: NEGATIVE
Chlamydia: NEGATIVE
Comment: NEGATIVE
Comment: NEGATIVE
Comment: NEGATIVE
Comment: NEGATIVE
Comment: NEGATIVE
Comment: NORMAL
Neisseria Gonorrhea: NEGATIVE
Trichomonas: NEGATIVE

## 2024-01-02 MED ORDER — METRONIDAZOLE 500 MG PO TABS
500.0000 mg | ORAL_TABLET | Freq: Two times a day (BID) | ORAL | 0 refills | Status: AC
Start: 2024-01-02 — End: 2024-01-10
  Filled 2024-01-02: qty 14, 7d supply, fill #0

## 2024-01-03 ENCOUNTER — Ambulatory Visit

## 2024-01-03 ENCOUNTER — Other Ambulatory Visit: Payer: Self-pay

## 2024-01-07 ENCOUNTER — Ambulatory Visit

## 2024-01-22 ENCOUNTER — Encounter (HOSPITAL_COMMUNITY): Payer: Self-pay

## 2024-01-22 ENCOUNTER — Ambulatory Visit (HOSPITAL_COMMUNITY)
Admission: EM | Admit: 2024-01-22 | Discharge: 2024-01-22 | Disposition: A | Discharge status: Home / Self Care | Attending: Internal Medicine | Admitting: Internal Medicine

## 2024-01-22 ENCOUNTER — Other Ambulatory Visit: Payer: Self-pay

## 2024-01-22 DIAGNOSIS — N760 Acute vaginitis: Secondary | ICD-10-CM | POA: Insufficient documentation

## 2024-01-22 DIAGNOSIS — L292 Pruritus vulvae: Secondary | ICD-10-CM | POA: Insufficient documentation

## 2024-01-22 DIAGNOSIS — N898 Other specified noninflammatory disorders of vagina: Secondary | ICD-10-CM | POA: Diagnosis present

## 2024-01-22 DIAGNOSIS — Z113 Encounter for screening for infections with a predominantly sexual mode of transmission: Secondary | ICD-10-CM | POA: Insufficient documentation

## 2024-01-22 LAB — HIV ANTIBODY (ROUTINE TESTING W REFLEX): HIV Screen 4th Generation wRfx: NONREACTIVE

## 2024-01-22 LAB — RPR: RPR Ser Ql: NONREACTIVE

## 2024-01-22 MED ORDER — FLUCONAZOLE 150 MG PO TABS
150.0000 mg | ORAL_TABLET | ORAL | 0 refills | Status: DC
  Filled 2024-01-22: qty 2, 6d supply, fill #0

## 2024-01-22 MED ORDER — CLINDAMYCIN HCL 300 MG PO CAPS
300.0000 mg | ORAL_CAPSULE | Freq: Two times a day (BID) | ORAL | 0 refills | Status: AC
Start: 2024-01-22 — End: 2024-01-29
  Filled 2024-01-22: qty 14, 7d supply, fill #0

## 2024-01-22 NOTE — ED Triage Notes (Addendum)
 Pt states clear vaginal discharge with vaginal itching for the past week.  Pt also states she would like to be tested for STD's including blood work,pregnancy and UTI.

## 2024-01-22 NOTE — ED Provider Notes (Signed)
 [facility]    CSN: [id number] Arrival date & time: 01/22/24  9160      History   Chief Complaint Chief Complaint  Patient presents with   Vaginal Discharge    HPI Jamie Neal is a 28 y.o. female.   Jamie Neal is a 28 y.o. female presenting for chief complaint of vaginal discharge, vaginal itching, and vaginal odor that started approximately 1 week ago. Vaginal discharge is milky/clear and is associated with slight vaginal odor and itching. Recently treated for BV on 01/01/2024 with flagyl  pills. Symptoms improved for a week, then returned 10-14 days later. Sexually active with one female partner unprotected. She's unsure if her partner has been unfaithful and may have been sexually active with a different partner, she wonders if this is causing her to have recurrent BV/yeast. Denies known exposures to STDs. She would like STD testing today. Denies pelvic/abdominal pain, urinary symptoms, fever/chills, flank pain, low back pain, and chance of pregnancy. Takes OCPs without missed doses. She had a negative pregnancy test on 01/01/2024 via urine and hCG quant.  She has not attempted use of any OTC medications for symptoms PTA.    Vaginal Discharge   Past Medical History:  Diagnosis Date   BV (bacterial vaginosis)    Missed period 12/21/2022   UTI (urinary tract infection)     There are no active problems to display for this patient.   Past Surgical History:  Procedure Laterality Date   CESAREAN SECTION  03/12/2020   Procedure: CESAREAN SECTION;  Surgeon: Zina Jerilynn LABOR, MD;  Location: [facility];  Service: Obstetrics;;    OB History     Gravida  4   Para  3   Term  3   Preterm      AB  1   Living  3      SAB  1   IAB  0   Ectopic  0   Multiple  0   Live Births  3            Home Medications    Prior to Admission medications   Medication Sig Start Date End Date Taking? Authorizing Provider  clindamycin  (CLEOCIN ) 300 MG capsule  Take 1 capsule (300 mg total) by mouth in the morning and at bedtime for 7 days. 01/22/24 01/29/24 Yes StanhopeDorna HERO, FNP  fluconazole  (DIFLUCAN ) 150 MG tablet Take 1 tablet (150 mg total) by mouth every 3 (three) days. 01/22/24  Yes Enedelia Dorna HERO, FNP  acetaminophen  (TYLENOL ) 325 MG tablet Take 2 tablets (650 mg total) by mouth every 4 (four) hours as needed (for pain scale < 4). Patient not taking: Reported on 10/01/2023 09/02/23   Jhonny Augustin BROCKS, MD  ibuprofen  (ADVIL ) 600 MG tablet Take 1 tablet (600 mg total) by mouth every 6 (six) hours. Patient not taking: Reported on 10/01/2023 09/02/23   Jhonny Augustin BROCKS, MD  multivitamin prental (TRINATAL) 60-1 MG TABS tablet Take 1 tablet by mouth daily. Patient not taking: Reported on 10/01/2023 02/07/23   Ervin, Michael L, MD  norethindrone  (ORTHO MICRONOR ) 0.35 MG tablet Take 1 tablet (0.35 mg total) by mouth daily. 09/02/23 09/01/24  Jhonny Augustin BROCKS, MD  oxyCODONE  10 MG TABS Take 0.5 tablets (5 mg total) by mouth every 4 (four) hours as needed (pain scale > 7). Patient not taking: Reported on 10/01/2023 09/02/23   Jhonny Augustin BROCKS, MD  polyethylene glycol powder (MIRALAX ) 17 GM/SCOOP powder Take 17 g by mouth daily as  needed for mild constipation. Patient not taking: Reported on 10/01/2023 09/02/23   Kandis Devaughn Sayres, MD  senna-docusate (SENOKOT-S) 8.6-50 MG tablet Take 2 tablets by mouth daily. Patient not taking: Reported on 10/01/2023 09/02/23   Jhonny Augustin BROCKS, MD  valACYclovir  (VALTREX ) 500 MG tablet Take 1 tablet (500 mg total) by mouth 2 (two) times daily. Patient taking differently: Take 500 mg by mouth daily as needed (Outbreak). 08/03/23   Nicholaus Jorene BRAVO, PA-C    Family History Family History  Problem Relation Age of Onset   Healthy Mother    Diabetes Father    Hypertension Father     Social History Social History   Tobacco Use   Smoking status: Never   Smokeless tobacco: Never  Vaping Use   Vaping status: Former  Substance Use Topics    Alcohol use: Not Currently    Comment: occasionally   Drug use: Never     Allergies   Bee venom and Pork-derived products   Review of Systems Review of Systems  Genitourinary:  Positive for vaginal discharge.  Per HPI   Physical Exam Triage Vital Signs ED Triage Vitals  Encounter Vitals Group     BP 01/22/24 0849 92/61     Girls Systolic BP Percentile --      Girls Diastolic BP Percentile --      Boys Systolic BP Percentile --      Boys Diastolic BP Percentile --      Pulse Rate 01/22/24 0849 91     Resp 01/22/24 0849 16     Temp 01/22/24 0849 98.2 F (36.8 C)     Temp Source 01/22/24 0849 Oral     SpO2 01/22/24 0849 98 %     Weight --      Height --      Head Circumference --      Peak Flow --      Pain Score 01/22/24 0848 0     Pain Loc --      Pain Education --      Exclude from Growth Chart --    No data found.  Updated Vital Signs BP 92/61 (BP Location: Left Arm)   Pulse 91   Temp 98.2 F (36.8 C) (Oral)   Resp 16   LMP  (LMP Unknown)   SpO2 98%   Visual Acuity Right Eye Distance:   Left Eye Distance:   Bilateral Distance:    Right Eye Near:   Left Eye Near:    Bilateral Near:     Physical Exam Vitals and nursing note reviewed.  Constitutional:      Appearance: She is not ill-appearing or toxic-appearing.  HENT:     Head: Normocephalic and atraumatic.     Right Ear: Hearing and external ear normal.     Left Ear: Hearing and external ear normal.     Nose: Nose normal.     Mouth/Throat:     Lips: Pink.  Eyes:     General: Lids are normal. Vision grossly intact. Gaze aligned appropriately.     Extraocular Movements: Extraocular movements intact.     Conjunctiva/sclera: Conjunctivae normal.  Pulmonary:     Effort: Pulmonary effort is normal.  Abdominal:     General: Bowel sounds are normal.     Palpations: Abdomen is soft.     Tenderness: There is no abdominal tenderness. There is no right CVA tenderness, left CVA tenderness or  guarding.  Musculoskeletal:     Cervical back:  Neck supple.  Skin:    General: Skin is warm and dry.     Capillary Refill: Capillary refill takes less than 2 seconds.     Findings: No rash.  Neurological:     General: No focal deficit present.     Mental Status: She is alert and oriented to person, place, and time. Mental status is at baseline.     Cranial Nerves: No dysarthria or facial asymmetry.  Psychiatric:        Mood and Affect: Mood normal.        Speech: Speech normal.        Behavior: Behavior normal.        Thought Content: Thought content normal.        Judgment: Judgment normal.      UC Treatments / Results  Labs (all labs ordered are listed, but only abnormal results are displayed) Labs Reviewed  HIV ANTIBODY (ROUTINE TESTING W REFLEX)  RPR  CERVICOVAGINAL ANCILLARY ONLY    EKG   Radiology No results found.  Procedures Procedures (including critical care time)  Medications Ordered in UC Medications - No data to display  Initial Impression / Assessment and Plan / UC Course  I have reviewed the triage vital signs and the nursing notes.  Pertinent labs & imaging results that were available during my care of the patient were reviewed by me and considered in my medical decision making (see chart for details).   1. Screening for STD, acute vaginitis, vaginal itching High clinical suspicion for bacterial vaginosis, therefore will treat with clindamycin  empirically instead of flagyl .  Empiric treatment with diflucan  for suspected yeast vaginitis ordered.   Vaginal swab pending, will treat for other positive infections per protocol when labs result. Discussed tips to prevent recurrent BV/yeast infections.  She does not use scented soaps currently and uses sensitive skin laundry detergents/soaps.  Safe sex precautions discussed.    Counseled patient on potential for adverse effects with medications prescribed/recommended today, strict ER and  return-to-clinic precautions discussed, patient verbalized understanding.    Final Clinical Impressions(s) / UC Diagnoses   Final diagnoses:  Screening for STD (sexually transmitted disease)  Acute vaginitis  Vaginal itching     Discharge Instructions      I suspect your vaginal symptoms are due to BV and vaginal yeast infection.  We are going to try treating you with a different antibiotic called clindamycin  today for BV. Take clindamycin  every 12 hours (twice daily) for the next 7 days.  Diflucan  has been sent to treat this.  Take 1 pill today, then another pill in 3 days if you are still having vaginal symptoms.   Your swab has been sent for testing, staff will call you in 2-3 days if you test positive for any other infections and will provide treatment at that time.  Wear cotton underwear and avoid wearing tight clothing to prevent vaginal yeast infections in the future.   Return to urgent care as needed and follow-up with your primary care provider for further evaluation and management of your symptoms..  I hope you feel better!      ED Prescriptions     Medication Sig Dispense Auth. Provider   clindamycin  (CLEOCIN ) 300 MG capsule Take 1 capsule (300 mg total) by mouth in the morning and at bedtime for 7 days. 14 capsule Enedelia Dorna HERO, FNP   fluconazole  (DIFLUCAN ) 150 MG tablet Take 1 tablet (150 mg total) by mouth every 3 (three) days. 2 tablet Bob Eastwood,  Dorna HERO, FNP      PDMP not reviewed this encounter.   Enedelia Dorna Elliott, [place] 01/22/24 [phone]

## 2024-01-22 NOTE — Discharge Instructions (Addendum)
 I suspect your vaginal symptoms are due to BV and vaginal yeast infection.  We are going to try treating you with a different antibiotic called clindamycin  today for BV. Take clindamycin  every 12 hours (twice daily) for the next 7 days.  Diflucan  has been sent to treat this.  Take 1 pill today, then another pill in 3 days if you are still having vaginal symptoms.   Your swab has been sent for testing, staff will call you in 2-3 days if you test positive for any other infections and will provide treatment at that time.  Wear cotton underwear and avoid wearing tight clothing to prevent vaginal yeast infections in the future.   Return to urgent care as needed and follow-up with your primary care provider for further evaluation and management of your symptoms..  I hope you feel better!

## 2024-01-23 ENCOUNTER — Ambulatory Visit (HOSPITAL_COMMUNITY): Payer: Self-pay

## 2024-01-23 LAB — CERVICOVAGINAL ANCILLARY ONLY
Bacterial Vaginitis (gardnerella): NEGATIVE
Candida Glabrata: NEGATIVE
Candida Vaginitis: POSITIVE — AB
Chlamydia: NEGATIVE
Comment: NEGATIVE
Comment: NEGATIVE
Comment: NEGATIVE
Comment: NEGATIVE
Comment: NEGATIVE
Comment: NORMAL
Neisseria Gonorrhea: NEGATIVE
Trichomonas: NEGATIVE

## 2024-01-30 ENCOUNTER — Encounter (HOSPITAL_COMMUNITY): Payer: Self-pay | Admitting: Emergency Medicine

## 2024-01-30 ENCOUNTER — Ambulatory Visit (HOSPITAL_COMMUNITY)
Admission: EM | Admit: 2024-01-30 | Discharge: 2024-01-30 | Disposition: A | Discharge status: Home / Self Care | Attending: Family Medicine | Admitting: Family Medicine

## 2024-01-30 ENCOUNTER — Ambulatory Visit (HOSPITAL_COMMUNITY): Admission: EM | Admit: 2024-01-30 | Discharge: 2024-01-30 | Disposition: A | Discharge status: Home / Self Care

## 2024-01-30 DIAGNOSIS — N3090 Cystitis, unspecified without hematuria: Secondary | ICD-10-CM

## 2024-01-30 DIAGNOSIS — R350 Frequency of micturition: Secondary | ICD-10-CM

## 2024-01-30 LAB — POCT URINALYSIS DIP (MANUAL ENTRY)
Glucose, UA: 250 mg/dL — AB
Nitrite, UA: POSITIVE — AB
Protein Ur, POC: >=300 mg/dL — AB
Spec Grav, UA: 1.01 (ref 1.010–1.025)
Urobilinogen, UA: >=8 U/dL — AB
pH, UA: 5 (ref 5.0–8.0)

## 2024-01-30 MED ORDER — CEPHALEXIN 500 MG PO CAPS
500.0000 mg | ORAL_CAPSULE | Freq: Two times a day (BID) | ORAL | 0 refills | Status: DC
  Filled 2024-01-30: qty 10, 5d supply, fill #0

## 2024-01-30 NOTE — ED Triage Notes (Signed)
 Pt c/o urinary frequency and urgency x's 4 days.

## 2024-01-30 NOTE — Discharge Instructions (Addendum)
 e

## 2024-01-31 ENCOUNTER — Other Ambulatory Visit: Payer: Self-pay

## 2024-02-21 ENCOUNTER — Ambulatory Visit (HOSPITAL_COMMUNITY): Admission: EM | Admit: 2024-02-21 | Discharge: 2024-02-21 | Disposition: A | Discharge status: Home / Self Care

## 2024-02-22 ENCOUNTER — Other Ambulatory Visit: Payer: Self-pay

## 2024-02-22 ENCOUNTER — Encounter (HOSPITAL_COMMUNITY): Payer: Self-pay | Admitting: *Deleted

## 2024-02-22 ENCOUNTER — Ambulatory Visit (HOSPITAL_COMMUNITY): Admission: EM | Admit: 2024-02-22 | Discharge: 2024-02-22 | Disposition: A | Discharge status: Home / Self Care

## 2024-02-22 DIAGNOSIS — Z3202 Encounter for pregnancy test, result negative: Secondary | ICD-10-CM | POA: Diagnosis not present

## 2024-02-22 DIAGNOSIS — Z113 Encounter for screening for infections with a predominantly sexual mode of transmission: Secondary | ICD-10-CM | POA: Diagnosis present

## 2024-02-22 DIAGNOSIS — B9689 Other specified bacterial agents as the cause of diseases classified elsewhere: Secondary | ICD-10-CM | POA: Diagnosis not present

## 2024-02-22 DIAGNOSIS — N760 Acute vaginitis: Secondary | ICD-10-CM | POA: Insufficient documentation

## 2024-02-22 LAB — POCT URINALYSIS DIP (MANUAL ENTRY)
Bilirubin, UA: NEGATIVE
Glucose, UA: NEGATIVE mg/dL
Ketones, POC UA: NEGATIVE mg/dL
Leukocytes, UA: NEGATIVE
Nitrite, UA: NEGATIVE
Protein Ur, POC: NEGATIVE mg/dL
Spec Grav, UA: >=1.03 — AB (ref 1.010–1.025)
Urobilinogen, UA: 0.2 U/dL
pH, UA: 7 (ref 5.0–8.0)

## 2024-02-22 LAB — HIV ANTIBODY (ROUTINE TESTING W REFLEX): HIV Screen 4th Generation wRfx: NONREACTIVE

## 2024-02-22 LAB — POCT URINE PREGNANCY: Preg Test, Ur: NEGATIVE

## 2024-02-22 MED ORDER — METRONIDAZOLE 500 MG PO TABS
500.0000 mg | ORAL_TABLET | Freq: Two times a day (BID) | ORAL | 0 refills | Status: DC
  Filled 2024-02-22: qty 14, 7d supply, fill #0

## 2024-02-22 NOTE — ED Triage Notes (Signed)
 Pt reports vag .itching and discharge for one week. Pt knows she has BV and yeast infection. Pt tried OTC which made problem worse.

## 2024-02-22 NOTE — ED Provider Notes (Signed)
 [facility] [place]  Note:  This document was prepared using Conservation officer, historic buildings and may include unintentional dictation errors.  MRN: [id number] DOB: 1996-02-10  Subjective:   Jamie Neal is a 28 y.o. female presenting for vaginal itching and irritation and vaginal odor x 1 week.  Patient reports frequent history of BV and yeast infection.  Has tried over-the-counter medication with minimal improvement.  Patient denies any known exposure to anyone with STDs or other secondary infection.  Patient reports that she has noticed the odor is worse while working.  Patient denies any dysuria or increased urinary frequency, nausea/vomiting, severe abdominal pain or flank pain.  No current facility-administered medications for this encounter.  Current Outpatient Medications:    metroNIDAZOLE  (FLAGYL ) 500 MG tablet, Take 1 tablet (500 mg total) by mouth 2 (two) times daily., Disp: 14 tablet, Rfl: 0   norethindrone  (ORTHO MICRONOR ) 0.35 MG tablet, Take 1 tablet (0.35 mg total) by mouth daily., Disp: 28 tablet, Rfl: 11   acetaminophen  (TYLENOL ) 325 MG tablet, Take 2 tablets (650 mg total) by mouth every 4 (four) hours as needed (for pain scale < 4). (Patient not taking: Reported on 10/01/2023), Disp: 30 tablet, Rfl: 0   fluconazole  (DIFLUCAN ) 150 MG tablet, Take 1 tablet (150 mg total) by mouth every 3 (three) days., Disp: 2 tablet, Rfl: 0   multivitamin prental (TRINATAL) 60-1 MG TABS tablet, Take 1 tablet by mouth daily. (Patient not taking: Reported on 10/01/2023), Disp: 30 tablet, Rfl: 12   polyethylene glycol powder (MIRALAX ) 17 GM/SCOOP powder, Take 17 g by mouth daily as needed for mild constipation. (Patient not taking: Reported on 10/01/2023), Disp: 238 g, Rfl: 0   senna-docusate (SENOKOT-S) 8.6-50 MG tablet, Take 2 tablets by mouth daily. (Patient not taking: Reported on 10/01/2023), Disp: 30 tablet, Rfl: 0   valACYclovir  (VALTREX ) 500 MG tablet, Take 1 tablet (500 mg total) by  mouth 2 (two) times daily. (Patient taking differently: Take 500 mg by mouth daily as needed (Outbreak).), Disp: 60 tablet, Rfl: 2   Allergies  Allergen Reactions   Bee Venom    Porcine (Pork) Protein-Containing Drug Products     Cultural practice    Past Medical History:  Diagnosis Date   BV (bacterial vaginosis)    Missed period 12/21/2022   UTI (urinary tract infection)      Past Surgical History:  Procedure Laterality Date   CESAREAN SECTION  03/12/2020   Procedure: CESAREAN SECTION;  Surgeon: Zina Jerilynn LABOR, MD;  Location: [facility];  Service: Obstetrics;;    Family History  Problem Relation Age of Onset   Healthy Mother    Diabetes Father    Hypertension Father     Social History   Tobacco Use   Smoking status: Never   Smokeless tobacco: Never  Vaping Use   Vaping status: Former  Substance Use Topics   Alcohol use: Not Currently    Comment: occasionally   Drug use: Never    ROS Refer to HPI for ROS details.  Objective:    Vitals: BP 132/80   Pulse 87   Temp 98 F (36.7 C)   Resp 18   LMP 01/30/2024 (Approximate)   SpO2 98%   Physical Exam Vitals and nursing note reviewed.  Constitutional:      General: She is not in acute distress.    Appearance: Normal appearance. She is well-developed. She is not ill-appearing or toxic-appearing.  HENT:     Head: Normocephalic and atraumatic.  Mouth/Throat:     Mouth: Mucous membranes are moist.  Cardiovascular:     Rate and Rhythm: Normal rate.  Pulmonary:     Effort: Pulmonary effort is normal. No respiratory distress.  Abdominal:     Palpations: Abdomen is soft.     Tenderness: There is no abdominal tenderness. There is no right CVA tenderness or left CVA tenderness.  Genitourinary:    Vagina: No vaginal discharge or bleeding.  Skin:    General: Skin is warm and dry.  Neurological:     General: No focal deficit present.     Mental Status: She is alert and oriented to person, place, and  time.  Psychiatric:        Mood and Affect: Mood normal.        Behavior: Behavior normal.     Procedures  Results for orders placed or performed during the hospital encounter of 02/22/24 (from the past 24 hours)  POCT urinalysis dipstick     Status: Abnormal   Collection Time: 02/22/24 10:26 AM  Result Value Ref Range   Color, UA light yellow (A) yellow   Clarity, UA cloudy (A) clear   Glucose, UA negative negative mg/dL   Bilirubin, UA negative negative   Ketones, POC UA negative negative mg/dL   Spec Grav, UA >=8.969 (A) 1.010 - 1.025   Blood, UA small (A) negative   pH, UA 7.0 5.0 - 8.0   Protein Ur, POC negative negative mg/dL   Urobilinogen, UA 0.2 0.2 or 1.0 E.U./dL   Nitrite, UA Negative Negative   Leukocytes, UA Negative Negative  POCT urine pregnancy     Status: None   Collection Time: 02/22/24 10:29 AM  Result Value Ref Range   Preg Test, Ur Negative Negative    Assessment and Plan :     Discharge Instructions       1. BV (bacterial vaginosis) (Primary) - POCT urinalysis dipstick completed in UC shows small blood, no leukocytes, no nitrite, no significant sign of urinary infection - POCT urine pregnancy completed UC is negative - Cervicovaginal swab collected in UC and sent to lab for further testing results should be available in 2 to 3 days - Urine Culture collected in UC and sent to lab for further testing results should be available in 2 to 3 days.  2. Screen for STD (sexually transmitted disease) - HIV Antibody (routine testing w rflx) - RPR - metroNIDAZOLE  (FLAGYL ) 500 MG tablet; Take 1 tablet (500 mg total) by mouth 2 (two) times daily.  Dispense: 14 tablet; Refill: 0 - If vaginal swab is positive for vaginal Candida prescription for Diflucan  150 mg x 2 doses to be sent to the pharmacy to treat yeast infection.  -Continue to monitor symptoms for any change in severity if there is any escalation of current symptoms or development of new symptoms  follow-up in ER for further evaluation and management.      Glendora Clouatre B Yenifer Saccente   Kazandra Forstrom B, NP 02/22/24 1100

## 2024-02-22 NOTE — Discharge Instructions (Signed)
  1. BV (bacterial vaginosis) (Primary) - POCT urinalysis dipstick completed in UC shows small blood, no leukocytes, no nitrite, no significant sign of urinary infection - POCT urine pregnancy completed UC is negative - Cervicovaginal swab collected in UC and sent to lab for further testing results should be available in 2 to 3 days - Urine Culture collected in UC and sent to lab for further testing results should be available in 2 to 3 days.  2. Screen for STD (sexually transmitted disease) - HIV Antibody (routine testing w rflx) - RPR - metroNIDAZOLE  (FLAGYL ) 500 MG tablet; Take 1 tablet (500 mg total) by mouth 2 (two) times daily.  Dispense: 14 tablet; Refill: 0 - If vaginal swab is positive for vaginal Candida prescription for Diflucan  150 mg x 2 doses to be sent to the pharmacy to treat yeast infection.  -Continue to monitor symptoms for any change in severity if there is any escalation of current symptoms or development of new symptoms follow-up in ER for further evaluation and management.

## 2024-02-23 LAB — URINE CULTURE
Culture: >=100000 — AB
Special Requests: NORMAL

## 2024-02-23 LAB — RPR: RPR Ser Ql: NONREACTIVE

## 2024-02-25 ENCOUNTER — Ambulatory Visit (HOSPITAL_COMMUNITY): Payer: Self-pay

## 2024-02-25 LAB — CERVICOVAGINAL ANCILLARY ONLY
Bacterial Vaginitis (gardnerella): NEGATIVE
Candida Glabrata: NEGATIVE
Candida Vaginitis: POSITIVE — AB
Chlamydia: NEGATIVE
Comment: NEGATIVE
Comment: NEGATIVE
Comment: NEGATIVE
Comment: NEGATIVE
Comment: NEGATIVE
Comment: NORMAL
Neisseria Gonorrhea: NEGATIVE
Trichomonas: NEGATIVE

## 2024-02-26 ENCOUNTER — Other Ambulatory Visit: Payer: Self-pay

## 2024-02-26 ENCOUNTER — Telehealth (HOSPITAL_COMMUNITY): Payer: Self-pay | Admitting: *Deleted

## 2024-02-26 MED ORDER — FLUCONAZOLE 150 MG PO TABS
ORAL_TABLET | ORAL | 1 refills | Status: DC
  Filled 2024-02-26: qty 2, 6d supply, fill #0

## 2024-02-26 NOTE — Telephone Encounter (Signed)
 Pt wants diflucan  sent into [facility]

## 2024-02-26 NOTE — ED Provider Notes (Signed)
 Pt called stating that she would like diflucan  sent to the pharmacy [facility]. I sent diflucan  to pharmacy.    Andra Corean BROCKS, PA-C 02/26/24 [phone]

## 2024-03-07 ENCOUNTER — Ambulatory Visit (HOSPITAL_COMMUNITY)
Admission: EM | Admit: 2024-03-07 | Discharge: 2024-03-07 | Disposition: A | Discharge status: Home / Self Care | Attending: Nurse Practitioner | Admitting: Nurse Practitioner

## 2024-03-07 ENCOUNTER — Encounter (HOSPITAL_COMMUNITY): Payer: Self-pay

## 2024-03-07 ENCOUNTER — Other Ambulatory Visit: Payer: Self-pay

## 2024-03-07 DIAGNOSIS — N760 Acute vaginitis: Secondary | ICD-10-CM | POA: Insufficient documentation

## 2024-03-07 DIAGNOSIS — Z113 Encounter for screening for infections with a predominantly sexual mode of transmission: Secondary | ICD-10-CM | POA: Diagnosis present

## 2024-03-07 DIAGNOSIS — Z3202 Encounter for pregnancy test, result negative: Secondary | ICD-10-CM

## 2024-03-07 LAB — POCT URINE PREGNANCY: Preg Test, Ur: NEGATIVE

## 2024-03-07 LAB — HIV ANTIBODY (ROUTINE TESTING W REFLEX): HIV Screen 4th Generation wRfx: NONREACTIVE

## 2024-03-07 MED ORDER — METRONIDAZOLE 500 MG PO TABS
500.0000 mg | ORAL_TABLET | Freq: Two times a day (BID) | ORAL | 0 refills | Status: DC
  Filled 2024-03-07: qty 14, 7d supply, fill #0

## 2024-03-07 MED ORDER — FLUCONAZOLE 150 MG PO TABS
150.0000 mg | ORAL_TABLET | ORAL | 0 refills | Status: AC
  Filled 2024-03-07: qty 2, 3d supply, fill #0

## 2024-03-07 NOTE — ED Triage Notes (Signed)
 Pt c/o clear vaginal d/c with odor x1wk. Pt requesting STD, HIV, and BV testing.

## 2024-03-07 NOTE — ED Provider Notes (Signed)
 [facility]    CSN: [id number] Arrival date & time: 03/07/24  0911      History   Chief Complaint Chief Complaint  Patient presents with   SEXUALLY TRANSMITTED DISEASE    HPI Jamie Neal is a 28 y.o. female.   Discussed the use of AI scribe software for clinical note transcription with the patient, who gave verbal consent to proceed.   The patient presents with vaginal discharge with a bad smell for the past week. The discharge is described as thin and clear. She reports minimal irritation but denies itching. She denies any burning or pain with urination. Her last menstrual period was at the end of October going into November. She reports having a new partner recently and does not use condoms. She has had 2 female sexual partners over the past 3 months. She has a 17 month old daughter and is not currently breastfeeding.  The following sections of the patient's history were reviewed and updated as appropriate: allergies, current medications, past family history, past medical history, past social history, past surgical history, and problem list.       Past Medical History:  Diagnosis Date   BV (bacterial vaginosis)    Missed period 12/21/2022   UTI (urinary tract infection)     There are no active problems to display for this patient.   Past Surgical History:  Procedure Laterality Date   CESAREAN SECTION  03/12/2020   Procedure: CESAREAN SECTION;  Surgeon: Zina Jerilynn LABOR, MD;  Location: [facility];  Service: Obstetrics;;    OB History     Gravida  4   Para  3   Term  3   Preterm      AB  1   Living  3      SAB  1   IAB  0   Ectopic  0   Multiple  0   Live Births  3            Home Medications    Prior to Admission medications   Medication Sig Start Date End Date Taking? Authorizing Provider  fluconazole  (DIFLUCAN ) 150 MG tablet Take 1 tablet (150 mg total) by mouth every 3 (three) days for 2 doses. 03/07/24 03/11/24 Yes  Darling Cieslewicz, FNP  metroNIDAZOLE  (FLAGYL ) 500 MG tablet Take 1 tablet (500 mg total) by mouth 2 (two) times daily for 7 days. 03/07/24 03/14/24 Yes Iola Lukes, FNP  norethindrone  (ORTHO MICRONOR ) 0.35 MG tablet Take 1 tablet (0.35 mg total) by mouth daily. 09/02/23 09/01/24  Jhonny Augustin BROCKS, MD  valACYclovir  (VALTREX ) 500 MG tablet Take 1 tablet (500 mg total) by mouth 2 (two) times daily. Patient taking differently: Take 500 mg by mouth daily as needed (Outbreak). 08/03/23   Nicholaus Jorene BRAVO, PA-C    Family History Family History  Problem Relation Age of Onset   Healthy Mother    Diabetes Father    Hypertension Father     Social History Social History   Tobacco Use   Smoking status: Never   Smokeless tobacco: Never  Vaping Use   Vaping status: Former  Substance Use Topics   Alcohol use: Not Currently    Comment: occasionally   Drug use: Never     Allergies   Bee venom and Porcine (pork) protein-containing drug products   Review of Systems Review of Systems  Gastrointestinal:  Negative for abdominal pain, nausea and vomiting.  Genitourinary:  Positive for vaginal discharge (clear and thin with  odor). Negative for dysuria and menstrual problem (LMP around the 27th of October).       Mild irritation. No itching  Musculoskeletal:  Negative for back pain.  All other systems reviewed and are negative.    Physical Exam Triage Vital Signs ED Triage Vitals  Encounter Vitals Group     BP 03/07/24 1029 (!) 108/58     Girls Systolic BP Percentile --      Girls Diastolic BP Percentile --      Boys Systolic BP Percentile --      Boys Diastolic BP Percentile --      Pulse Rate 03/07/24 1029 85     Resp 03/07/24 1029 18     Temp 03/07/24 1029 98.1 F (36.7 C)     Temp Source 03/07/24 1029 Oral     SpO2 03/07/24 1029 97 %     Weight --      Height --      Head Circumference --      Peak Flow --      Pain Score 03/07/24 1027 0     Pain Loc --      Pain Education --       Exclude from Growth Chart --    No data found.  Updated Vital Signs BP (!) 108/58 (BP Location: Right Arm)   Pulse 85   Temp 98.1 F (36.7 C) (Oral)   Resp 18   LMP 02/29/2024 (Approximate)   SpO2 97%   Breastfeeding No   Visual Acuity Right Eye Distance:   Left Eye Distance:   Bilateral Distance:    Right Eye Near:   Left Eye Near:    Bilateral Near:     Physical Exam Constitutional:      General: She is not in acute distress.    Appearance: Normal appearance. She is not ill-appearing, toxic-appearing or diaphoretic.  HENT:     Head: Normocephalic.     Nose: Nose normal.     Mouth/Throat:     Mouth: Mucous membranes are moist.  Eyes:     Conjunctiva/sclera: Conjunctivae normal.  Cardiovascular:     Rate and Rhythm: Normal rate.  Pulmonary:     Effort: Pulmonary effort is normal.  Abdominal:     Palpations: Abdomen is soft.  Genitourinary:    Comments: Deferred; patient performed self-swab for Aptima testing  Musculoskeletal:        General: Normal range of motion.     Cervical back: Normal range of motion and neck supple.  Skin:    General: Skin is warm and dry.  Neurological:     General: No focal deficit present.     Mental Status: She is alert and oriented to person, place, and time.  Psychiatric:        Mood and Affect: Mood normal.        Behavior: Behavior normal.      UC Treatments / Results  Labs (all labs ordered are listed, but only abnormal results are displayed) Labs Reviewed  HIV ANTIBODY (ROUTINE TESTING W REFLEX)  RPR  POCT URINE PREGNANCY  CERVICOVAGINAL ANCILLARY ONLY    EKG   Radiology No results found.  Procedures Procedures (including critical care time)  Medications Ordered in UC Medications - No data to display  Initial Impression / Assessment and Plan / UC Course  I have reviewed the triage vital signs and the nursing notes.  Pertinent labs & imaging results that were available during my care of the  patient  were reviewed by me and considered in my medical decision making (see chart for details).    Patient presents with vaginal discharge accompanied by odor.  Vaginal swabs were collected for gonorrhea, chlamydia, trichomonas, yeast, and bacterial vaginosis testing. Patient also requesting STD testing given new sexual partner. Blood testing for HIV ans RPR also collected. All test results are pending. Empiric treatment initiated with metronidazole  (Flagyl ) and fluconazole  (Diflucan ). Further management will be guided by pending test results.  The patient was advised to maintain adequate hydration and to use barrier protection if sexually active to help reduce the risk of recurrent infection. She was informed that she will be contacted only if results are positive, though all results will be available for review through her MyChart account. She was instructed to follow up with her primary care provider or gynecologist if symptoms do not improve with treatment and to seek medical attention sooner if she develops fever, pelvic pain, or worsening discharge.  Today's evaluation has revealed no signs of a dangerous process. Discussed diagnosis with patient and/or guardian. Patient and/or guardian aware of their diagnosis, possible red flag symptoms to watch out for and need for close follow up. Patient and/or guardian understands verbal and written discharge instructions. Patient and/or guardian comfortable with plan and disposition.  Patient and/or guardian has a clear mental status at this time, good insight into illness (after discussion and teaching) and has clear judgment to make decisions regarding their care  Documentation was completed with the aid of voice recognition software. Transcription may contain typographical errors.  Final Clinical Impressions(s) / UC Diagnoses   Final diagnoses:  Acute vaginitis  Screening examination for STD (sexually transmitted disease)     Discharge Instructions       You were seen today for vaginal discharge and odor, which can occur when the normal balance of bacteria in the vagina changes. This can be influenced by various factors, including new or multiple sexual partners, unprotected sex, douching, smoking, certain antibiotics, or pregnancy. It's also possible to develop a vaginal infection even without being sexually active.  Tests were performed today to check for bacteria, yeast, gonorrhea, chlamydia, and trichomonas. You also requested complete STD screening so blood testing for syphilis and HIV was also performed. While results are pending, treatment has been initiated for the most common non-STD causes--bacterial vaginosis and yeast infection. It is important that you avoid any sexual activity until your test results have returned, your treatment is complete, and your symptoms have fully resolved. You will only be contacted if any of your test results are positive. You can also review your results through your MyChart account.  During this time, avoid douching or using vaginal sprays or deodorants. Wear cotton or cotton-lined underwear to improve airflow and reduce moisture. Be sure to stay hydrated by drinking plenty of fluids. Please note that while you are taking the prescribed Flagyl  (metronidazole ) it is very important to avoid drinking alcohol while on this medication. Drinking alcohol can cause severe side effects, such as nausea, vomiting, and headaches. See your regular doctor or gynecologist if your symptoms do not start to improve with treatment. Go to the emergency room right away if you develop a fever, new or worsening pelvic pain, or if the vaginal discharge gets worse.      ED Prescriptions     Medication Sig Dispense Auth. Provider   metroNIDAZOLE  (FLAGYL ) 500 MG tablet Take 1 tablet (500 mg total) by mouth 2 (two) times daily for 7  days. 14 tablet Ezmae Speers, FNP   fluconazole  (DIFLUCAN ) 150 MG tablet Take 1 tablet (150 mg  total) by mouth every 3 (three) days for 2 doses. 2 tablet Iola Lukes, FNP      PDMP not reviewed this encounter.   Iola Lukes, [place] 03/07/24 1139

## 2024-03-07 NOTE — Discharge Instructions (Addendum)
 You were seen today for vaginal discharge and odor, which can occur when the normal balance of bacteria in the vagina changes. This can be influenced by various factors, including new or multiple sexual partners, unprotected sex, douching, smoking, certain antibiotics, or pregnancy. It's also possible to develop a vaginal infection even without being sexually active.  Tests were performed today to check for bacteria, yeast, gonorrhea, chlamydia, and trichomonas. You also requested complete STD screening so blood testing for syphilis and HIV was also performed. While results are pending, treatment has been initiated for the most common non-STD causes--bacterial vaginosis and yeast infection. It is important that you avoid any sexual activity until your test results have returned, your treatment is complete, and your symptoms have fully resolved. You will only be contacted if any of your test results are positive. You can also review your results through your MyChart account.  During this time, avoid douching or using vaginal sprays or deodorants. Wear cotton or cotton-lined underwear to improve airflow and reduce moisture. Be sure to stay hydrated by drinking plenty of fluids. Please note that while you are taking the prescribed Flagyl  (metronidazole ) it is very important to avoid drinking alcohol while on this medication. Drinking alcohol can cause severe side effects, such as nausea, vomiting, and headaches. See your regular doctor or gynecologist if your symptoms do not start to improve with treatment. Go to the emergency room right away if you develop a fever, new or worsening pelvic pain, or if the vaginal discharge gets worse.

## 2024-03-08 LAB — RPR: RPR Ser Ql: NONREACTIVE

## 2024-03-10 ENCOUNTER — Other Ambulatory Visit: Payer: Self-pay

## 2024-03-10 LAB — CERVICOVAGINAL ANCILLARY ONLY
Bacterial Vaginitis (gardnerella): NEGATIVE
Candida Glabrata: NEGATIVE
Candida Vaginitis: NEGATIVE
Chlamydia: NEGATIVE
Comment: NEGATIVE
Comment: NEGATIVE
Comment: NEGATIVE
Comment: NEGATIVE
Comment: NEGATIVE
Comment: NORMAL
Neisseria Gonorrhea: NEGATIVE
Trichomonas: NEGATIVE

## 2024-03-14 ENCOUNTER — Other Ambulatory Visit: Payer: Self-pay

## 2024-03-14 ENCOUNTER — Encounter (HOSPITAL_COMMUNITY): Payer: Self-pay

## 2024-03-14 ENCOUNTER — Ambulatory Visit (HOSPITAL_COMMUNITY)
Admission: EM | Admit: 2024-03-14 | Discharge: 2024-03-14 | Disposition: A | Discharge status: Home / Self Care | Attending: Emergency Medicine | Admitting: Emergency Medicine

## 2024-03-14 DIAGNOSIS — N3001 Acute cystitis with hematuria: Secondary | ICD-10-CM | POA: Diagnosis present

## 2024-03-14 DIAGNOSIS — Z113 Encounter for screening for infections with a predominantly sexual mode of transmission: Secondary | ICD-10-CM | POA: Diagnosis present

## 2024-03-14 LAB — POCT URINALYSIS DIP (MANUAL ENTRY)
Glucose, UA: 100 mg/dL — AB
Nitrite, UA: POSITIVE — AB
Protein Ur, POC: 100 mg/dL — AB
Spec Grav, UA: 1.02 (ref 1.010–1.025)
Urobilinogen, UA: 2 U/dL — AB
pH, UA: 5 (ref 5.0–8.0)

## 2024-03-14 LAB — POCT URINE PREGNANCY: Preg Test, Ur: NEGATIVE

## 2024-03-14 MED ORDER — NITROFURANTOIN MONOHYD MACRO 100 MG PO CAPS
100.0000 mg | ORAL_CAPSULE | Freq: Two times a day (BID) | ORAL | 0 refills | Status: AC
  Filled 2024-03-14: qty 14, 7d supply, fill #0

## 2024-03-14 NOTE — Discharge Instructions (Signed)
 We will call you if anything on urine culture requires a change in therapy (about 1-3 days)  In the meantime I am treating you for a urinary tract infection. Please take the antibiotic Macrobid  as prescribed, with food to avoid upset stomach. Drink lots of fluids!  We will also call you if anything on your vaginal swab returns positive. You can also see these results on MyChart. Please abstain from sexual intercourse until your results return.

## 2024-03-14 NOTE — ED Provider Notes (Signed)
 [facility]    CSN: [id number] Arrival date & time: 03/14/24  0849      History   Chief Complaint Chief Complaint  Patient presents with   Dysuria    HPI Jamie Neal is a 28 y.o. female.  1 week history of urinary frequency, urgency, dysuria, bladder pain History of UTI with similar symptoms No flank pain, fever, nausea/vomiting Took AZO this morning   Seen 1 week ago for vaginal discharge.  Cytology swab was negative. She reports having new onset brown discharge with odor since being seen. Requests repeat test  LMP 10/31  Past Medical History:  Diagnosis Date   BV (bacterial vaginosis)    Missed period 12/21/2022   UTI (urinary tract infection)     There are no active problems to display for this patient.   Past Surgical History:  Procedure Laterality Date   CESAREAN SECTION  03/12/2020   Procedure: CESAREAN SECTION;  Surgeon: Zina Jerilynn LABOR, MD;  Location: [facility];  Service: Obstetrics;;    OB History     Gravida  4   Para  3   Term  3   Preterm      AB  1   Living  3      SAB  1   IAB  0   Ectopic  0   Multiple  0   Live Births  3            Home Medications    Prior to Admission medications   Medication Sig Start Date End Date Taking? Authorizing Provider  nitrofurantoin , macrocrystal-monohydrate, (MACROBID ) 100 MG capsule Take 1 capsule (100 mg total) by mouth 2 (two) times daily for 7 days. 03/14/24 03/21/24 Yes Nishanth Mccaughan, [place], PA-C    Family History Family History  Problem Relation Age of Onset   Healthy Mother    Diabetes Father    Hypertension Father     Social History Social History   Tobacco Use   Smoking status: Never   Smokeless tobacco: Never  Vaping Use   Vaping status: Former  Substance Use Topics   Alcohol use: Not Currently    Comment: occasionally   Drug use: Never     Allergies   Bee venom and Porcine (pork) protein-containing drug products   Review of Systems Review  of Systems  Genitourinary:  Positive for dysuria.   As per HPI  Physical Exam Triage Vital Signs ED Triage Vitals  Encounter Vitals Group     BP 03/14/24 0918 (!) 105/52     Girls Systolic BP Percentile --      Girls Diastolic BP Percentile --      Boys Systolic BP Percentile --      Boys Diastolic BP Percentile --      Pulse Rate 03/14/24 0918 80     Resp 03/14/24 0918 18     Temp 03/14/24 0918 98.4 F (36.9 C)     Temp Source 03/14/24 0918 Oral     SpO2 03/14/24 0918 97 %     Weight 03/14/24 0918 172 lb (78 kg)     Height 03/14/24 0918 5' (1.524 m)     Head Circumference --      Peak Flow --      Pain Score 03/14/24 0917 0     Pain Loc --      Pain Education --      Exclude from Growth Chart --    No data found.  Updated  Vital Signs BP (!) 105/52 (BP Location: Right Arm)   Pulse 80   Temp 98.4 F (36.9 C) (Oral)   Resp 18   Ht 5' (1.524 m)   Wt 172 lb (78 kg)   LMP 02/29/2024 (Approximate)   SpO2 97%   Breastfeeding No   BMI 33.59 kg/m    Physical Exam Vitals and nursing note reviewed.  Constitutional:      General: She is not in acute distress. HENT:     Mouth/Throat:     Mouth: Mucous membranes are moist.     Pharynx: Oropharynx is clear.  Eyes:     Conjunctiva/sclera: Conjunctivae normal.     Pupils: Pupils are equal, round, and reactive to light.  Cardiovascular:     Rate and Rhythm: Normal rate and regular rhythm.     Heart sounds: Normal heart sounds.  Pulmonary:     Effort: Pulmonary effort is normal.     Breath sounds: Normal breath sounds.  Abdominal:     General: Bowel sounds are normal.     Palpations: Abdomen is soft.     Tenderness: There is no abdominal tenderness. There is no right CVA tenderness, left CVA tenderness or guarding.  Neurological:     Mental Status: She is alert and oriented to person, place, and time.     UC Treatments / Results  Labs (all labs ordered are listed, but only abnormal results are displayed) Labs  Reviewed  POCT URINALYSIS DIP (MANUAL ENTRY) - Abnormal; Notable for the following components:      Result Value   Color, UA red (*)    Clarity, UA turbid (*)    Glucose, UA =100 (*)    Bilirubin, UA small (*)    Ketones, POC UA trace (5) (*)    Blood, UA small (*)    Protein Ur, POC =100 (*)    Urobilinogen, UA 2.0 (*)    Nitrite, UA Positive (*)    Leukocytes, UA Large (3+) (*)    All other components within normal limits  URINE CULTURE  POCT URINE PREGNANCY  CERVICOVAGINAL ANCILLARY ONLY    EKG  Radiology No results found.  Procedures Procedures  Medications Ordered in UC Medications - No data to display  Initial Impression / Assessment and Plan / UC Course  I have reviewed the triage vital signs and the nursing notes.  Pertinent labs & imaging results that were available during my care of the patient were reviewed by me and considered in my medical decision making (see chart for details).  Afebrile, well appearing, non tender abd, no CVA tender ness  UPT negative UA is red which may interfere with results. Small RBC, +nitrate, large leuks. Small amount glucose, bilirubin, ketones, protein.  Culture is pending In meantime treat for acute cystitis. Macrobid  BID x 7 days  Cytology swab pending Treat positive result as indicated No questions at this time. Agrees to plan   Final Clinical Impressions(s) / UC Diagnoses   Final diagnoses:  Screen for STD (sexually transmitted disease)  Acute cystitis with hematuria     Discharge Instructions      We will call you if anything on urine culture requires a change in therapy (about 1-3 days)  In the meantime I am treating you for a urinary tract infection. Please take the antibiotic Macrobid  as prescribed, with food to avoid upset stomach. Drink lots of fluids!  We will also call you if anything on your vaginal swab returns positive. You  can also see these results on MyChart. Please abstain from sexual  intercourse until your results return.     ED Prescriptions     Medication Sig Dispense Auth. Provider   nitrofurantoin , macrocrystal-monohydrate, (MACROBID ) 100 MG capsule Take 1 capsule (100 mg total) by mouth 2 (two) times daily for 7 days. 14 capsule Mairely Foxworth, [place], PA-C      PDMP not reviewed this encounter.   Amiayah Giebel, [place], PA-C 03/14/24 1002

## 2024-03-14 NOTE — ED Triage Notes (Signed)
 Patient having urinary frequency, urinary urgency, and abdominal pain onset 1 week ago. Patient also having brown vaginal discharge.   Patient tried AZO this morning with no relief.

## 2024-03-16 ENCOUNTER — Ambulatory Visit (HOSPITAL_COMMUNITY): Payer: Self-pay | Admitting: Emergency Medicine

## 2024-03-16 LAB — URINE CULTURE: Culture: 20000 — AB

## 2024-03-17 ENCOUNTER — Other Ambulatory Visit: Payer: Self-pay

## 2024-03-17 LAB — CERVICOVAGINAL ANCILLARY ONLY
Bacterial Vaginitis (gardnerella): NEGATIVE
Candida Glabrata: NEGATIVE
Candida Vaginitis: NEGATIVE
Chlamydia: NEGATIVE
Comment: NEGATIVE
Comment: NEGATIVE
Comment: NEGATIVE
Comment: NEGATIVE
Comment: NEGATIVE
Comment: NORMAL
Neisseria Gonorrhea: NEGATIVE
Trichomonas: NEGATIVE

## 2024-03-17 MED ORDER — SULFAMETHOXAZOLE-TRIMETHOPRIM 800-160 MG PO TABS
1.0000 | ORAL_TABLET | Freq: Two times a day (BID) | ORAL | 0 refills | Status: AC
  Filled 2024-03-17: qty 6, 3d supply, fill #0

## 2024-03-17 NOTE — Telephone Encounter (Signed)
 Pt called stating script had not been sent. Updated.

## 2024-03-31 ENCOUNTER — Ambulatory Visit (HOSPITAL_COMMUNITY)
Admission: EM | Admit: 2024-03-31 | Discharge: 2024-03-31 | Disposition: A | Discharge status: Home / Self Care | Attending: Emergency Medicine | Admitting: Emergency Medicine

## 2024-03-31 ENCOUNTER — Other Ambulatory Visit: Payer: Self-pay

## 2024-03-31 ENCOUNTER — Encounter (HOSPITAL_COMMUNITY): Payer: Self-pay | Admitting: *Deleted

## 2024-03-31 DIAGNOSIS — R300 Dysuria: Secondary | ICD-10-CM | POA: Insufficient documentation

## 2024-03-31 DIAGNOSIS — R1030 Lower abdominal pain, unspecified: Secondary | ICD-10-CM | POA: Insufficient documentation

## 2024-03-31 DIAGNOSIS — N3001 Acute cystitis with hematuria: Secondary | ICD-10-CM | POA: Diagnosis not present

## 2024-03-31 LAB — POCT URINE DIPSTICK
Glucose, UA: 100 mg/dL — AB
Leukocytes, UA: NEGATIVE
Nitrite, UA: POSITIVE — AB
POC PROTEIN,UA: 30 — AB
Spec Grav, UA: >=1.03 — AB (ref 1.010–1.025)
Urobilinogen, UA: 1 U/dL
pH, UA: 5.5 (ref 5.0–8.0)

## 2024-03-31 LAB — POCT URINE PREGNANCY: Preg Test, Ur: NEGATIVE

## 2024-03-31 MED ORDER — CIPROFLOXACIN HCL 500 MG PO TABS
500.0000 mg | ORAL_TABLET | Freq: Two times a day (BID) | ORAL | 0 refills | Status: DC
  Filled 2024-03-31: qty 10, 5d supply, fill #0

## 2024-03-31 NOTE — ED Provider Notes (Signed)
 [facility]    CSN: [id number] Arrival date & time: 03/31/24  0802      History   Chief Complaint Chief Complaint  Patient presents with   Abdominal Pain    HPI Jamie Neal is a 28 y.o. female.   Patient presents with persistent lower abdominal pain and dysuria for approximately 1 week.  Patient was seen here on 11/14 and was started on Macrobid  for a urinary tract infection.  Her urine culture revealed that her antibiotic needed to be switched and therefore Bactrim  was sent in for this.  Patient states that she took all 3 days of Bactrim  and continues to have symptoms.  Patient states that the abdominal pain is now radiating to her bilateral flank.  Patient denies fever, body aches, chills, weakness, and hematuria.  LMP 10/31.  The history is provided by the patient and medical records.  Abdominal Pain   Past Medical History:  Diagnosis Date   BV (bacterial vaginosis)    Missed period 12/21/2022   UTI (urinary tract infection)     There are no active problems to display for this patient.   Past Surgical History:  Procedure Laterality Date   CESAREAN SECTION  03/12/2020   Procedure: CESAREAN SECTION;  Surgeon: Zina Jerilynn LABOR, MD;  Location: [facility];  Service: Obstetrics;;    OB History     Gravida  4   Para  3   Term  3   Preterm      AB  1   Living  3      SAB  1   IAB  0   Ectopic  0   Multiple  0   Live Births  3            Home Medications    Prior to Admission medications   Medication Sig Start Date End Date Taking? Authorizing Provider  ciprofloxacin  (CIPRO ) 500 MG tablet Take 1 tablet (500 mg total) by mouth every 12 (twelve) hours. 03/31/24  Yes Johnie Rumaldo LABOR, NP    Family History Family History  Problem Relation Age of Onset   Healthy Mother    Diabetes Father    Hypertension Father     Social History Social History   Tobacco Use   Smoking status: Never   Smokeless tobacco: Never  Vaping Use    Vaping status: Former  Substance Use Topics   Alcohol use: Not Currently    Comment: occasionally   Drug use: Never     Allergies   Bee venom and Porcine (pork) protein-containing drug products   Review of Systems Review of Systems  Gastrointestinal:  Positive for abdominal pain.   Per HPI  Physical Exam Triage Vital Signs ED Triage Vitals  Encounter Vitals Group     BP 03/31/24 0824 93/62     Girls Systolic BP Percentile --      Girls Diastolic BP Percentile --      Boys Systolic BP Percentile --      Boys Diastolic BP Percentile --      Pulse Rate 03/31/24 0824 79     Resp 03/31/24 0824 18     Temp 03/31/24 0824 98.1 F (36.7 C)     Temp Source 03/31/24 0824 Oral     SpO2 03/31/24 0824 98 %     Weight --      Height --      Head Circumference --      Peak Flow --  Pain Score 03/31/24 0823 9     Pain Loc --      Pain Education --      Exclude from Growth Chart --    No data found.  Updated Vital Signs BP 93/62 (BP Location: Right Arm)   Pulse 79   Temp 98.1 F (36.7 C) (Oral)   Resp 18   LMP 02/29/2024 (Approximate)   SpO2 98%   Visual Acuity Right Eye Distance:   Left Eye Distance:   Bilateral Distance:    Right Eye Near:   Left Eye Near:    Bilateral Near:     Physical Exam Vitals and nursing note reviewed.  Constitutional:      General: She is awake.     Appearance: Normal appearance. She is well-developed and well-groomed.  Abdominal:     General: Abdomen is flat. Bowel sounds are normal. There is no distension.     Palpations: Abdomen is soft.     Tenderness: There is abdominal tenderness in the suprapubic area. There is no right CVA tenderness, left CVA tenderness, guarding or rebound.     Hernia: No hernia is present.  Neurological:     Mental Status: She is alert.  Psychiatric:        Behavior: Behavior is cooperative.      UC Treatments / Results  Labs (all labs ordered are listed, but only abnormal results are  displayed) Labs Reviewed  POCT URINE DIPSTICK - Abnormal; Notable for the following components:      Result Value   Color, UA other (*)    Glucose, UA =100 (*)    Bilirubin, UA moderate (*)    Ketones, POC UA trace (5) (*)    Spec Grav, UA >=1.030 (*)    Blood, UA small (*)    POC PROTEIN,UA =30 (*)    Nitrite, UA Positive (*)    All other components within normal limits  URINE CULTURE  POCT URINE PREGNANCY    EKG   Radiology No results found.  Procedures Procedures (including critical care time)  Medications Ordered in UC Medications - No data to display  Initial Impression / Assessment and Plan / UC Course  I have reviewed the triage vital signs and the nursing notes.  Pertinent labs & imaging results that were available during my care of the patient were reviewed by me and considered in my medical decision making (see chart for details).     Patient is overall well-appearing.  Vitals are stable.  Mild tenderness noted in the suprapubic region.  Urinalysis reveals positive nitrites, small RBCs, small protein, ketones, and bilirubin.  Will send urine culture to confirm presence of urinary tract infection.  Empirically treating for continued urinary tract infection with ciprofloxacin.  Discussed follow-up, return, and strict ER precautions. Final Clinical Impressions(s) / UC Diagnoses   Final diagnoses:  Lower abdominal pain  Acute cystitis with hematuria  Dysuria     Discharge Instructions      Start taking ciprofloxacin twice daily for 5 days for urinary tract infection. Make sure you are drinking lots of water  and staying hydrated. Follow up with your primary care provider or return here as needed. If you develop fever, body aches, chills, weakness, or worsening abdominal/back pain please seek immediate medical treatment in the emergency department.   ED Prescriptions     Medication Sig Dispense Auth. Provider   ciprofloxacin (CIPRO) 500 MG tablet Take 1  tablet (500 mg total) by mouth every 12 (twelve)  hours. 10 tablet Johnie Flaming A, NP      PDMP not reviewed this encounter.   Johnie Flaming A, NP 03/31/24 [phone]

## 2024-03-31 NOTE — ED Triage Notes (Addendum)
 Pt states she has lower abdominal pain X 1 week, she states she just got over a UTI which she finished the antibiotics for. She took AZO

## 2024-03-31 NOTE — Discharge Instructions (Addendum)
 Start taking ciprofloxacin twice daily for 5 days for urinary tract infection. Make sure you are drinking lots of water  and staying hydrated. Follow up with your primary care provider or return here as needed. If you develop fever, body aches, chills, weakness, or worsening abdominal/back pain please seek immediate medical treatment in the emergency department.

## 2024-04-01 LAB — URINE CULTURE: Culture: NO GROWTH

## 2024-04-02 ENCOUNTER — Ambulatory Visit (HOSPITAL_COMMUNITY): Payer: Self-pay

## 2024-04-21 ENCOUNTER — Encounter: Payer: Self-pay | Admitting: Gastroenterology

## 2024-04-22 ENCOUNTER — Encounter (HOSPITAL_COMMUNITY): Payer: Self-pay | Admitting: *Deleted

## 2024-04-22 ENCOUNTER — Other Ambulatory Visit: Payer: Self-pay

## 2024-04-22 ENCOUNTER — Ambulatory Visit (HOSPITAL_COMMUNITY)
Admission: EM | Admit: 2024-04-22 | Discharge: 2024-04-22 | Disposition: A | Discharge status: Home / Self Care | Attending: Emergency Medicine | Admitting: Emergency Medicine

## 2024-04-22 DIAGNOSIS — R110 Nausea: Secondary | ICD-10-CM | POA: Diagnosis not present

## 2024-04-22 DIAGNOSIS — R197 Diarrhea, unspecified: Secondary | ICD-10-CM | POA: Diagnosis not present

## 2024-04-22 DIAGNOSIS — R1011 Right upper quadrant pain: Secondary | ICD-10-CM

## 2024-04-22 MED ORDER — ONDANSETRON 4 MG PO TBDP
4.0000 mg | ORAL_TABLET | Freq: Three times a day (TID) | ORAL | 0 refills | Status: DC | PRN
  Filled 2024-04-22: qty 10, 4d supply, fill #0

## 2024-04-22 MED ORDER — DICYCLOMINE HCL 20 MG PO TABS
20.0000 mg | ORAL_TABLET | Freq: Two times a day (BID) | ORAL | 0 refills | Status: AC
  Filled 2024-04-22: qty 20, 10d supply, fill #0

## 2024-04-22 NOTE — ED Provider Notes (Signed)
 " [facility]    CSN: [id number] Arrival date & time: 04/22/24  0935      History   Chief Complaint Chief Complaint  Patient presents with   Abdominal Pain    HPI Jamie Neal is a 28 y.o. female.   Patient presents with right upper quadrant pain, nausea, and diarrhea that began on 12/1.  Patient states that her symptoms seem to improve after few days but then returned.  Patient is concerned that she may have GERD as she googled her symptoms and these seem to be consistent with this diagnosis.  Patient is requesting a referral to GI for further evaluation of this.  Patient reports that the right upper quadrant pain, nausea, and diarrhea mainly occurred only during or after eating.  Denies fever, vomiting, blood in vomit or stool, fatigue, and severe lethargy.  Patient was seen here on 12/1 for continued urinary tract related symptoms.  Patient was prescribed ciprofloxacin  at that time for continued urinary tract infection treatment.  Urine culture later revealed that patient no longer needs to be on antibiotics for this.  Patient was contacted in informed that she needed to stop taking this antibiotic.  The history is provided by the patient and medical records.  Abdominal Pain   Past Medical History:  Diagnosis Date   BV (bacterial vaginosis)    Missed period 12/21/2022   UTI (urinary tract infection)     There are no active problems to display for this patient.   Past Surgical History:  Procedure Laterality Date   CESAREAN SECTION  03/12/2020   Procedure: CESAREAN SECTION;  Surgeon: Zina Jerilynn LABOR, MD;  Location: [facility];  Service: Obstetrics;;    OB History     Gravida  4   Para  3   Term  3   Preterm      AB  1   Living  3      SAB  1   IAB  0   Ectopic  0   Multiple  0   Live Births  3            Home Medications    Prior to Admission medications  Medication Sig Start Date End Date Taking? Authorizing Provider   dicyclomine  (BENTYL ) 20 MG tablet Take 1 tablet (20 mg total) by mouth 2 (two) times daily. 04/22/24  Yes Johnie, Quaid Yeakle A, NP  ondansetron  (ZOFRAN -ODT) 4 MG disintegrating tablet Take 1 tablet (4 mg total) by mouth every 8 (eight) hours as needed for nausea or vomiting. 04/22/24  Yes Johnie, Ranay Ketter A, NP  ciprofloxacin  (CIPRO ) 500 MG tablet Take 1 tablet (500 mg total) by mouth every 12 (twelve) hours. 03/31/24   Johnie Rumaldo LABOR, NP    Family History Family History  Problem Relation Age of Onset   Healthy Mother    Diabetes Father    Hypertension Father     Social History Social History[1]   Allergies   Bee venom and Porcine (pork) protein-containing drug products   Review of Systems Review of Systems  Gastrointestinal:  Positive for abdominal pain.   Per HPI  Physical Exam Triage Vital Signs ED Triage Vitals  Encounter Vitals Group     BP 04/22/24 1020 109/70     Girls Systolic BP Percentile --      Girls Diastolic BP Percentile --      Boys Systolic BP Percentile --      Boys Diastolic BP Percentile --  Pulse Rate 04/22/24 1020 87     Resp 04/22/24 1020 16     Temp 04/22/24 1020 98.4 F (36.9 C)     Temp Source 04/22/24 1020 Oral     SpO2 04/22/24 1020 97 %     Weight --      Height --      Head Circumference --      Peak Flow --      Pain Score 04/22/24 1019 6     Pain Loc --      Pain Education --      Exclude from Growth Chart --    No data found.  Updated Vital Signs BP 109/70 (BP Location: Left Arm)   Pulse 87   Temp 98.4 F (36.9 C) (Oral)   Resp 16   LMP 03/27/2024 (Approximate)   SpO2 97%   Breastfeeding No   Visual Acuity Right Eye Distance:   Left Eye Distance:   Bilateral Distance:    Right Eye Near:   Left Eye Near:    Bilateral Near:     Physical Exam Vitals and nursing note reviewed.  Constitutional:      General: She is awake. She is not in acute distress.    Appearance: Normal appearance. She is well-developed  and well-groomed. She is not ill-appearing.  Cardiovascular:     Rate and Rhythm: Normal rate and regular rhythm.  Pulmonary:     Effort: Pulmonary effort is normal.     Breath sounds: Normal breath sounds.  Abdominal:     General: Abdomen is flat. Bowel sounds are normal.     Palpations: Abdomen is soft.     Tenderness: There is abdominal tenderness in the right upper quadrant. There is no guarding or rebound. Negative signs include Murphy's sign.     Comments: Mild tenderness noted to right upper quadrant without guarding or rebound tenderness.  Skin:    General: Skin is warm and dry.  Neurological:     Mental Status: She is alert.  Psychiatric:        Behavior: Behavior is cooperative.      UC Treatments / Results  Labs (all labs ordered are listed, but only abnormal results are displayed) Labs Reviewed - No data to display  EKG   Radiology No results found.  Procedures Procedures (including critical care time)  Medications Ordered in UC Medications - No data to display  Initial Impression / Assessment and Plan / UC Course  I have reviewed the triage vital signs and the nursing notes.  Pertinent labs & imaging results that were available during my care of the patient were reviewed by me and considered in my medical decision making (see chart for details).     Patient is overall well-appearing.  Vitals are stable.  Mild tenderness noted to right upper quadrant without guarding or rebound tenderness.  Negative Murphy sign.  Discussed with patient that symptoms could be related to gallbladder.  Provided patient with referral to GI as requested for further evaluation of this.    Prescribed Bentyl  as needed for abdominal cramping.  Prescribe Zofran  as needed for nausea and vomiting.  Discussed with patient that she may need referral from primary care provider and gave information for a PCP to get established with if this is the case.  Discussed follow-up, return, and  strict ER precautions. Final Clinical Impressions(s) / UC Diagnoses   Final diagnoses:  Right upper quadrant pain  Nausea  Diarrhea, unspecified type  Discharge Instructions      I provided you with a referral to [facility] for further evaluation of your symptoms.  If you do not hear from them within a week from now then you can call to schedule an appointment as soon as possible for further evaluation. You can take Bentyl  twice daily for abdominal cramping. You can take Zofran  every 8 hours as needed for nausea and vomiting. Make sure you are staying hydrated getting plenty of rest. I have attached information for a family medicine doctor that you get established with for primary care if this is necessary in order to receive a referral to GI. If you develop worsening abdominal pain, excessive vomiting, blood in vomit or stool, or fever please seek immediate medical treatment in the emergency department.     ED Prescriptions     Medication Sig Dispense Auth. Provider   dicyclomine  (BENTYL ) 20 MG tablet Take 1 tablet (20 mg total) by mouth 2 (two) times daily. 20 tablet Johnie Flaming A, NP   ondansetron  (ZOFRAN -ODT) 4 MG disintegrating tablet Take 1 tablet (4 mg total) by mouth every 8 (eight) hours as needed for nausea or vomiting. 10 tablet Johnie Flaming A, NP      PDMP not reviewed this encounter.    [1]  Social History Tobacco Use   Smoking status: Never   Smokeless tobacco: Never  Vaping Use   Vaping status: Former  Substance Use Topics   Alcohol use: Not Currently    Comment: occasionally   Drug use: Never     Johnie Flaming LABOR, NP 04/22/24 1111  "

## 2024-04-22 NOTE — Discharge Instructions (Signed)
 I provided you with a referral to [facility] for further evaluation of your symptoms.  If you do not hear from them within a week from now then you can call to schedule an appointment as soon as possible for further evaluation. You can take Bentyl  twice daily for abdominal cramping. You can take Zofran  every 8 hours as needed for nausea and vomiting. Make sure you are staying hydrated getting plenty of rest. I have attached information for a family medicine doctor that you get established with for primary care if this is necessary in order to receive a referral to GI. If you develop worsening abdominal pain, excessive vomiting, blood in vomit or stool, or fever please seek immediate medical treatment in the emergency department.

## 2024-04-22 NOTE — ED Triage Notes (Addendum)
 Pt states she has RUQ pain, nausea, diarrhea since 03/31/2024. She states that the issues got better but back again. She thinks it maybe gurey

## 2024-04-24 ENCOUNTER — Emergency Department (HOSPITAL_COMMUNITY)
Admission: EM | Admit: 2024-04-24 | Discharge: 2024-04-24 | Disposition: A | Discharge status: Home / Self Care | Attending: Emergency Medicine | Admitting: Emergency Medicine

## 2024-04-24 ENCOUNTER — Other Ambulatory Visit: Payer: Self-pay

## 2024-04-24 ENCOUNTER — Encounter (HOSPITAL_COMMUNITY): Payer: Self-pay

## 2024-04-24 ENCOUNTER — Emergency Department (HOSPITAL_COMMUNITY)

## 2024-04-24 DIAGNOSIS — R1011 Right upper quadrant pain: Secondary | ICD-10-CM | POA: Insufficient documentation

## 2024-04-24 LAB — LIPASE, BLOOD: Lipase: 30 U/L (ref 11–51)

## 2024-04-24 LAB — CBC
HCT: 35.6 % — ABNORMAL LOW (ref 36.0–46.0)
Hemoglobin: 11.7 g/dL — ABNORMAL LOW (ref 12.0–15.0)
MCH: 28.6 pg (ref 26.0–34.0)
MCHC: 32.9 g/dL (ref 30.0–36.0)
MCV: 87 fL (ref 80.0–100.0)
Platelets: 207 K/uL (ref 150–400)
RBC: 4.09 MIL/uL (ref 3.87–5.11)
RDW: 13.6 % (ref 11.5–15.5)
WBC: 8.7 K/uL (ref 4.0–10.5)
nRBC: 0 % (ref 0.0–0.2)

## 2024-04-24 LAB — COMPREHENSIVE METABOLIC PANEL WITH GFR
ALT: 11 U/L (ref 0–44)
AST: 19 U/L (ref 15–41)
Albumin: 4.1 g/dL (ref 3.5–5.0)
Alkaline Phosphatase: 63 U/L (ref 38–126)
Anion gap: 9 (ref 5–15)
BUN: 13 mg/dL (ref 6–20)
CO2: 26 mmol/L (ref 22–32)
Calcium: 9 mg/dL (ref 8.9–10.3)
Chloride: 104 mmol/L (ref 98–111)
Creatinine, Ser: 0.66 mg/dL (ref 0.44–1.00)
GFR, Estimated: >60 mL/min
Glucose, Bld: 88 mg/dL (ref 70–99)
Potassium: 3.9 mmol/L (ref 3.5–5.1)
Sodium: 138 mmol/L (ref 135–145)
Total Bilirubin: 0.5 mg/dL (ref 0.0–1.2)
Total Protein: 7.6 g/dL (ref 6.5–8.1)

## 2024-04-24 LAB — URINALYSIS, ROUTINE W REFLEX MICROSCOPIC
Bilirubin Urine: NEGATIVE
Glucose, UA: NEGATIVE mg/dL
Ketones, ur: NEGATIVE mg/dL
Leukocytes,Ua: NEGATIVE
Nitrite: NEGATIVE
Protein, ur: NEGATIVE mg/dL
Specific Gravity, Urine: 1.024 (ref 1.005–1.030)
pH: 5 (ref 5.0–8.0)

## 2024-04-24 LAB — HCG, SERUM, QUALITATIVE: Preg, Serum: NEGATIVE

## 2024-04-24 MED ORDER — ALUM & MAG HYDROXIDE-SIMETH 200-200-20 MG/5ML PO SUSP
30.0000 mL | Freq: Once | ORAL | Status: AC
  Administered 2024-04-24: 30 mL via ORAL
  Filled 2024-04-24: qty 30

## 2024-04-24 MED ORDER — OMEPRAZOLE 20 MG PO CPDR
20.0000 mg | DELAYED_RELEASE_CAPSULE | Freq: Every day | ORAL | 0 refills | Status: AC
  Filled 2024-04-24: qty 30, 30d supply, fill #0

## 2024-04-24 MED ORDER — ONDANSETRON 4 MG PO TBDP
4.0000 mg | ORAL_TABLET | Freq: Once | ORAL | Status: AC
  Administered 2024-04-24: 4 mg via ORAL
  Filled 2024-04-24: qty 1

## 2024-04-24 MED ORDER — LIDOCAINE VISCOUS HCL 2 % MT SOLN
15.0000 mL | Freq: Once | OROMUCOSAL | Status: AC
  Administered 2024-04-24: 15 mL via ORAL
  Filled 2024-04-24: qty 15

## 2024-04-24 NOTE — ED Triage Notes (Signed)
 Pt has had RLQ ABD pain with bloating and N/V/D for 2 months - went Urgent Care today and they tested her for UTI but was negative.

## 2024-04-24 NOTE — ED Provider Notes (Signed)
 " [place] Provider Note   CSN: [id number] Arrival date & time: 04/24/24  9751     Patient presents with: Abdominal Pain   Jamie Neal is a 28 y.o. female.   28 year old female presents emergency room with complaint of right side abdominal pain for the past 2 months associated with nausea, vomiting, bloating.  Pain does not radiate, is worse after eating.  Denies associated fevers or chills.  Patient went to urgent care, was told she may have a urinary tract infection was prescribed antibiotics.  She felt this gave her brief relief of her pain although she was called and told that she did not have a urinary tract infection after all.  Reports associated diarrhea.  No other complaints or concerns.  Request testing for gonorrhea, chlamydia, syphilis.  On chart review, these test were all negative last month.       Prior to Admission medications  Medication Sig Start Date End Date Taking? Authorizing Provider  omeprazole  (PRILOSEC) 20 MG capsule Take 1 capsule (20 mg total) by mouth daily. 04/24/24  Yes Beverley Leita LABOR, PA-C  ciprofloxacin  (CIPRO ) 500 MG tablet Take 1 tablet (500 mg total) by mouth every 12 (twelve) hours. 03/31/24   Johnie Flaming A, NP  dicyclomine  (BENTYL ) 20 MG tablet Take 1 tablet (20 mg total) by mouth 2 (two) times daily. 04/22/24   Johnie Flaming A, NP  ondansetron  (ZOFRAN -ODT) 4 MG disintegrating tablet Take 1 tablet (4 mg total) by mouth every 8 (eight) hours as needed for nausea or vomiting. 04/22/24   Johnie Flaming A, NP    Allergies: Bee venom and Porcine (pork) protein-containing drug products    Review of Systems Negative except as per HPI Updated Vital Signs BP 107/70   Pulse 66   Temp 97.8 F (36.6 C)   Resp 15   Ht 5' (1.524 m)   Wt 78 kg   LMP 03/27/2024 (Approximate)   SpO2 100%   BMI 33.58 kg/m   Physical Exam Vitals and nursing note reviewed.  Constitutional:      General: She  is not in acute distress.    Appearance: She is well-developed. She is not diaphoretic.  HENT:     Head: Normocephalic and atraumatic.  Pulmonary:     Effort: Pulmonary effort is normal.  Abdominal:     Palpations: Abdomen is soft.     Tenderness: There is abdominal tenderness in the right upper quadrant. There is no right CVA tenderness, left CVA tenderness, guarding or rebound.  Neurological:     Mental Status: She is alert and oriented to person, place, and time.  Psychiatric:        Behavior: Behavior normal.     (all labs ordered are listed, but only abnormal results are displayed) Labs Reviewed  CBC - Abnormal; Notable for the following components:      Result Value   Hemoglobin 11.7 (*)    HCT 35.6 (*)    All other components within normal limits  URINALYSIS, ROUTINE W REFLEX MICROSCOPIC - Abnormal; Notable for the following components:   APPearance HAZY (*)    Hgb urine dipstick MODERATE (*)    Bacteria, UA RARE (*)    All other components within normal limits  LIPASE, BLOOD  COMPREHENSIVE METABOLIC PANEL WITH GFR  HCG, SERUM, QUALITATIVE    EKG: None  Radiology: US  Abdomen Limited Result Date: 04/24/2024 EXAM: Right Upper Quadrant Abdominal Ultrasound 04/24/2024 04:32:06 AM TECHNIQUE: Real-time ultrasonography  of the right upper quadrant of the abdomen was performed. COMPARISON: None available. CLINICAL HISTORY: 28 year old female with right upper quadrant pain. FINDINGS: LIVER: Normal echogenicity. No intrahepatic biliary ductal dilatation. No evidence of mass. Hepatopetal flow in the portal vein. BILIARY SYSTEM: Gallbladder wall thickness measures 0.2 cm. The gallbladder is partially contracted. No sonographic Beverley sign is elicited. No pericholecystic fluid. No cholelithiasis. Common bile duct is nondilated measuring 2 mm. RIGHT KIDNEY: Visible right kidney appears nonobstructed, with a circumscribed cyst measuring about 2 cm (no follow up imaging recommended).  OTHER: No right upper quadrant ascites. IMPRESSION: 1. Negative right upper quadrant ultrasound. Electronically signed by: Helayne Hurst MD 04/24/2024 04:52 AM EST RP Workstation: [id number]     Procedures   Medications Ordered in the ED  alum & mag hydroxide-simeth (MAALOX/MYLANTA) 200-200-20 MG/5ML suspension 30 mL (30 mLs Oral Given 04/24/24 0359)    And  lidocaine  (XYLOCAINE ) 2 % viscous mouth solution 15 mL (15 mLs Oral Given 04/24/24 0359)  ondansetron  (ZOFRAN -ODT) disintegrating tablet 4 mg (4 mg Oral Given 04/24/24 0358)                                    Medical Decision Making Amount and/or Complexity of Data Reviewed Labs: ordered. Radiology: ordered.  Risk OTC drugs. Prescription drug management.   This patient presents to the ED for concern of right upper quadrant abdominal pain, this involves an extensive number of treatment options, and is a complaint that carries with it a high risk of complications and morbidity.  The differential diagnosis includes but not limited to acute cholecystitis, gastritis, peptic ulcer disease   Co morbidities / Chronic conditions that complicate the patient evaluation  Prior c-section    Additional history obtained:  Additional history obtained from EMR External records from outside source obtained and reviewed including prior labs and imaging on file   Lab Tests:  I Ordered, and personally interpreted labs.  The pertinent results include: hCG negative.  CBC without significant findings.  CMP within normal notes.  Lipase normal.  Urinalysis with moderate hemoglobin, negative for nitrites and leukocytes.   Imaging Studies ordered:  I ordered imaging studies including right upper quadrant ultrasound I independently visualized and interpreted imaging which showed normal I agree with the radiologist interpretation   Problem List / ED Course / Critical interventions / Medication management  28 year old female with complaint  of 3 months of right sided abdominal pain with nausea and vomiting.  Found to have tenderness in the right upper quadrant.  Labs reassuring.  CT without acute gallbladder pathology.  Patient is discharged on omeprazole  with referral to GI. I ordered medication including GI cocktail, zofran    Reevaluation of the patient after these medicines showed that the patient symptoms improved I have reviewed the patients home medicines and have made adjustments as needed    Social Determinants of Health:  No PCP on file   Test / Admission - Considered:  Discharge      Final diagnoses:  Right upper quadrant abdominal pain    ED Discharge Orders          Ordered    omeprazole  (PRILOSEC) 20 MG capsule  Daily        04/24/24 0504               Beverley Leita LABOR, PA-C 04/24/24 9485    Jerral Meth, MD 04/24/24 [phone]  "

## 2024-04-24 NOTE — ED Notes (Signed)
 Patient transported to Ultrasound

## 2024-04-24 NOTE — Discharge Instructions (Addendum)
 Take Omeprazole  as prescribed. Follow up with GI, call to schedule an appointment. Return to the ER for severe or concerning symptoms.

## 2024-04-25 ENCOUNTER — Other Ambulatory Visit: Payer: Self-pay

## 2024-04-29 ENCOUNTER — Encounter (HOSPITAL_COMMUNITY): Payer: Self-pay | Admitting: Emergency Medicine

## 2024-04-29 ENCOUNTER — Ambulatory Visit (HOSPITAL_COMMUNITY)
Admission: EM | Admit: 2024-04-29 | Discharge: 2024-04-29 | Disposition: A | Discharge status: Home / Self Care | Attending: Emergency Medicine | Admitting: Emergency Medicine

## 2024-04-29 DIAGNOSIS — R300 Dysuria: Secondary | ICD-10-CM | POA: Insufficient documentation

## 2024-04-29 DIAGNOSIS — Z113 Encounter for screening for infections with a predominantly sexual mode of transmission: Secondary | ICD-10-CM | POA: Insufficient documentation

## 2024-04-29 LAB — POCT URINE DIPSTICK
Bilirubin, UA: NEGATIVE
Glucose, UA: NEGATIVE mg/dL
Nitrite, UA: NEGATIVE
Spec Grav, UA: 1.025
Urobilinogen, UA: 0.2 U/dL
pH, UA: 7

## 2024-04-29 LAB — HIV ANTIBODY (ROUTINE TESTING W REFLEX): HIV Screen 4th Generation wRfx: NONREACTIVE

## 2024-04-29 LAB — POCT URINE PREGNANCY: Preg Test, Ur: NEGATIVE

## 2024-04-29 NOTE — ED Triage Notes (Signed)
 Patient has a new partner now is having symptoms.  Patient is having discharge and a odor that started a few days ago.  She wants to be tested for everything

## 2024-04-29 NOTE — ED Provider Notes (Addendum)
 " [facility]    CSN: [id number] Arrival date & time: 04/29/24  8161      History   Chief Complaint Chief Complaint  Patient presents with   Vaginal Discharge    HPI Jamie Neal is a 28 y.o. female.   Patient presents to clinic requesting sexually-transmitted infection screening.  New partner around a week ago and over the past few days she started to develop a thick, clumpy, vaginal discharge with odor.  She is also having some discomfort with urination.  Has not had any abdominal pain, nausea, vomiting or fevers.  Would like screening for all STIs including HIV and syphilis.  No confirmed exposures.  The history is provided by the patient and medical records.  Vaginal Discharge   Past Medical History:  Diagnosis Date   BV (bacterial vaginosis)    Missed period 12/21/2022   UTI (urinary tract infection)     There are no active problems to display for this patient.   Past Surgical History:  Procedure Laterality Date   CESAREAN SECTION  03/12/2020   Procedure: CESAREAN SECTION;  Surgeon: Zina Jerilynn LABOR, MD;  Location: [facility];  Service: Obstetrics;;    OB History     Gravida  4   Para  3   Term  3   Preterm      AB  1   Living  3      SAB  1   IAB  0   Ectopic  0   Multiple  0   Live Births  3            Home Medications    Prior to Admission medications  Medication Sig Start Date End Date Taking? Authorizing Provider  ciprofloxacin  (CIPRO ) 500 MG tablet Take 1 tablet (500 mg total) by mouth every 12 (twelve) hours. 03/31/24   Johnie Flaming A, NP  dicyclomine  (BENTYL ) 20 MG tablet Take 1 tablet (20 mg total) by mouth 2 (two) times daily. 04/22/24   Johnie Flaming A, NP  omeprazole  (PRILOSEC) 20 MG capsule Take 1 capsule (20 mg total) by mouth daily. 04/24/24   Beverley Leita LABOR, PA-C  ondansetron  (ZOFRAN -ODT) 4 MG disintegrating tablet Take 1 tablet (4 mg total) by mouth every 8 (eight) hours as needed for nausea or  vomiting. 04/22/24   Johnie Flaming LABOR, NP    Family History Family History  Problem Relation Age of Onset   Healthy Mother    Diabetes Father    Hypertension Father     Social History Social History[1]   Allergies   Bee venom and Porcine (pork) protein-containing drug products   Review of Systems Review of Systems  Per HPI  Physical Exam Triage Vital Signs ED Triage Vitals  Encounter Vitals Group     BP 04/29/24 1954 95/64     Girls Systolic BP Percentile --      Girls Diastolic BP Percentile --      Boys Systolic BP Percentile --      Boys Diastolic BP Percentile --      Pulse Rate 04/29/24 1954 95     Resp 04/29/24 1954 16     Temp 04/29/24 1954 98.2 F (36.8 C)     Temp Source 04/29/24 1954 Oral     SpO2 04/29/24 1954 97 %     Weight --      Height --      Head Circumference --      Peak Flow --  Pain Score 04/29/24 1953 0     Pain Loc --      Pain Education --      Exclude from Growth Chart --    No data found.  Updated Vital Signs BP 95/64 (BP Location: Left Arm)   Pulse 95   Temp 98.2 F (36.8 C) (Oral)   Resp 16   LMP 03/27/2024 (Approximate)   SpO2 97%   Breastfeeding No   Visual Acuity Right Eye Distance:   Left Eye Distance:   Bilateral Distance:    Right Eye Near:   Left Eye Near:    Bilateral Near:     Physical Exam Vitals and nursing note reviewed.  Constitutional:      Appearance: Normal appearance.  HENT:     Head: Normocephalic and atraumatic.     Right Ear: External ear normal.     Left Ear: External ear normal.     Nose: Nose normal.     Mouth/Throat:     Mouth: Mucous membranes are moist.  Eyes:     Conjunctiva/sclera: Conjunctivae normal.  Cardiovascular:     Rate and Rhythm: Normal rate.  Pulmonary:     Effort: Pulmonary effort is normal. No respiratory distress.  Skin:    General: Skin is warm and dry.  Neurological:     General: No focal deficit present.     Mental Status: She is alert.   Psychiatric:        Mood and Affect: Mood normal.      UC Treatments / Results  Labs (all labs ordered are listed, but only abnormal results are displayed) Labs Reviewed  POCT URINE DIPSTICK - Abnormal; Notable for the following components:      Result Value   Ketones, POC UA trace (5) (*)    Blood, UA trace-lysed (*)    Protein Ur, POC trace (*)    Leukocytes, UA Small (1+) (*)    All other components within normal limits  URINE CULTURE  SYPHILIS: RPR W/REFLEX TO RPR TITER AND TREPONEMAL ANTIBODIES, TRADITIONAL SCREENING AND DIAGNOSIS ALGORITHM  HIV ANTIBODY (ROUTINE TESTING W REFLEX)  POCT URINE PREGNANCY  CERVICOVAGINAL ANCILLARY ONLY    EKG   Radiology No results found.  Procedures Procedures (including critical care time)  Medications Ordered in UC Medications - No data to display  Initial Impression / Assessment and Plan / UC Course  I have reviewed the triage vital signs and the nursing notes.  Pertinent labs & imaging results that were available during my care of the patient were reviewed by me and considered in my medical decision making (see chart for details).  Vitals and triage reviewed, patient is hemodynamically stable.  UA with trace red blood cells and small leukocytes, will send for culture. Urine pregnancy. Cytology, HIV and syphilis screening obtained.  Staff will contact if treatment is needed based on results.  Plan of care, follow-up care, and return precautions given, no questions at this time.     Final Clinical Impressions(s) / UC Diagnoses   Final diagnoses:  Screening examination for sexually transmitted disease  Dysuria     Discharge Instructions      Today have been screened for sexually transmitted infections.  Results to be available via MyChart over the next 1 to 2 days.  Several contact if treatment is needed based on results.  Abstain from intercourse until results have been received.  Return to clinic for any new or  urgent symptoms.     ED  Prescriptions   None    PDMP not reviewed this encounter.    Dreama Magnolia SAILOR, FNP 04/29/24 2041     [1]  Social History Tobacco Use   Smoking status: Never   Smokeless tobacco: Never  Vaping Use   Vaping status: Former  Substance Use Topics   Alcohol use: Not Currently    Comment: occasionally   Drug use: Never     Dreama, Akili Corsetti  N, FNP 04/29/24 2042  "

## 2024-04-29 NOTE — Discharge Instructions (Signed)
 Today have been screened for sexually transmitted infections.  Results to be available via MyChart over the next 1 to 2 days.  Several contact if treatment is needed based on results.  Abstain from intercourse until results have been received.  Return to clinic for any new or urgent symptoms.

## 2024-04-30 ENCOUNTER — Ambulatory Visit (HOSPITAL_COMMUNITY): Payer: Self-pay

## 2024-04-30 ENCOUNTER — Other Ambulatory Visit: Payer: Self-pay

## 2024-04-30 LAB — CERVICOVAGINAL ANCILLARY ONLY
Bacterial Vaginitis (gardnerella): POSITIVE — AB
Candida Glabrata: NEGATIVE
Candida Vaginitis: POSITIVE — AB
Chlamydia: NEGATIVE
Comment: NEGATIVE
Comment: NEGATIVE
Comment: NEGATIVE
Comment: NEGATIVE
Comment: NEGATIVE
Comment: NORMAL
Neisseria Gonorrhea: NEGATIVE
Trichomonas: NEGATIVE

## 2024-04-30 LAB — SYPHILIS: RPR W/REFLEX TO RPR TITER AND TREPONEMAL ANTIBODIES, TRADITIONAL SCREENING AND DIAGNOSIS ALGORITHM: RPR Ser Ql: NONREACTIVE

## 2024-04-30 MED ORDER — FLUCONAZOLE 150 MG PO TABS
150.0000 mg | ORAL_TABLET | Freq: Once | ORAL | 0 refills | Status: AC
  Filled 2024-04-30: qty 2, 2d supply, fill #0

## 2024-04-30 MED ORDER — METRONIDAZOLE 500 MG PO TABS
500.0000 mg | ORAL_TABLET | Freq: Two times a day (BID) | ORAL | 0 refills | Status: AC
  Filled 2024-04-30: qty 14, 7d supply, fill #0

## 2024-05-01 LAB — URINE CULTURE

## 2024-05-07 ENCOUNTER — Ambulatory Visit: Payer: Self-pay | Admitting: Obstetrics and Gynecology

## 2024-05-12 ENCOUNTER — Ambulatory Visit (HOSPITAL_COMMUNITY): Admission: EM | Admit: 2024-05-12 | Discharge: 2024-05-12 | Disposition: A | Discharge status: Home / Self Care

## 2024-05-12 ENCOUNTER — Other Ambulatory Visit: Payer: Self-pay

## 2024-05-12 DIAGNOSIS — N898 Other specified noninflammatory disorders of vagina: Secondary | ICD-10-CM | POA: Diagnosis not present

## 2024-05-12 DIAGNOSIS — Z3202 Encounter for pregnancy test, result negative: Secondary | ICD-10-CM

## 2024-05-12 LAB — POCT URINE PREGNANCY: Preg Test, Ur: NEGATIVE

## 2024-05-12 NOTE — Discharge Instructions (Signed)
  1. Screening for STD (sexually transmitted disease) (Primary) - POCT urine pregnancy completed UC is negative for pregnancy - Cervicovaginal swab collected in UC and sent to lab for further testing results should be available in 2 to 3 days. - If any abnormal findings with final report patient will be contacted appropriate treatment provided - Continue to monitor for any symptoms if you develop any symptoms of STD or any other concerning symptoms follow-up in ER for further evaluation and management.

## 2024-05-12 NOTE — ED Provider Notes (Signed)
 " [facility]  Note:  This document was prepared using Dragon voice recognition software and may include unintentional dictation errors.  MRN: [id number] DOB: Jul 07, 1995  Subjective:   Jamie Neal is a 29 y.o. female presenting for evaluation of vaginal discharge with vaginal odor.  Patient reports that she previously had bacterial vaginosis and was treated, symptoms mostly resolved but have now returned.  Patient reports last unprotected sex was 04/27/2024.  Patient reports that she had STD screening and blood testing completed after intercourse but was still concern for possible HIV.  Patient informed that testing for HIV may not be positive until 2 to 3 months after exposure.  Patient denies any dysuria, abdominal pain, flank pain, fever, vaginal lesions, nausea/vomiting, diarrhea.  Current Medications[1]   Allergies[2]  Past Medical History:  Diagnosis Date   BV (bacterial vaginosis)    Missed period 12/21/2022   UTI (urinary tract infection)      Past Surgical History:  Procedure Laterality Date   CESAREAN SECTION  03/12/2020   Procedure: CESAREAN SECTION;  Surgeon: Zina Jerilynn LABOR, MD;  Location: [facility];  Service: Obstetrics;;    Family History  Problem Relation Age of Onset   Healthy Mother    Diabetes Father    Hypertension Father     Social History[3]  ROS Refer to HPI for ROS details.  Objective:    Vitals: BP 97/63 (BP Location: Left Arm)   Pulse 82   Temp 97.8 F (36.6 C) (Oral)   Resp 16   LMP 05/01/2024 (Approximate)   SpO2 96%   Breastfeeding No   Physical Exam Vitals and nursing note reviewed.  Constitutional:      General: She is not in acute distress.    Appearance: Normal appearance. She is well-developed. She is not ill-appearing or toxic-appearing.  HENT:     Head: Normocephalic and atraumatic.  Cardiovascular:     Rate and Rhythm: Normal rate.  Pulmonary:     Effort: Pulmonary effort is normal. No respiratory  distress.     Breath sounds: No stridor. No wheezing.  Abdominal:     Palpations: Abdomen is soft.     Tenderness: There is no abdominal tenderness. There is no right CVA tenderness or left CVA tenderness.  Genitourinary:    Vagina: Vaginal discharge present.  Skin:    General: Skin is warm and dry.  Neurological:     General: No focal deficit present.     Mental Status: She is alert and oriented to person, place, and time.  Psychiatric:        Mood and Affect: Mood normal.        Behavior: Behavior normal.     Procedures  Results for orders placed or performed during the hospital encounter of 05/12/24 (from the past 24 hours)  POCT urine pregnancy     Status: Normal   Collection Time: 05/12/24 10:10 AM  Result Value Ref Range   Preg Test, Ur Negative Negative    Assessment and Plan :     Discharge Instructions       1. Screening for STD (sexually transmitted disease) (Primary) - POCT urine pregnancy completed UC is negative for pregnancy - Cervicovaginal swab collected in UC and sent to lab for further testing results should be available in 2 to 3 days. - If any abnormal findings with final report patient will be contacted appropriate treatment provided - Continue to monitor for any symptoms if you develop any symptoms of STD or  any other concerning symptoms follow-up in ER for further evaluation and management.       Mckenna Boruff B Arisbel Maione    [1] No current facility-administered medications for this encounter.  Current Outpatient Medications:    dicyclomine  (BENTYL ) 20 MG tablet, Take 1 tablet (20 mg total) by mouth 2 (two) times daily., Disp: 20 tablet, Rfl: 0   omeprazole  (PRILOSEC) 20 MG capsule, Take 1 capsule (20 mg total) by mouth daily., Disp: 30 capsule, Rfl: 0   ondansetron  (ZOFRAN -ODT) 4 MG disintegrating tablet, Take 1 tablet (4 mg total) by mouth every 8 (eight) hours as needed for nausea or vomiting., Disp: 10 tablet, Rfl: 0   ciprofloxacin  (CIPRO ) 500  MG tablet, Take 1 tablet (500 mg total) by mouth every 12 (twelve) hours., Disp: 10 tablet, Rfl: 0 [2]  Allergies Allergen Reactions   Bee Venom    Porcine (Pork) Protein-Containing Drug Products     Cultural practice  [3]  Social History Tobacco Use   Smoking status: Never   Smokeless tobacco: Never  Vaping Use   Vaping status: Former  Substance Use Topics   Alcohol use: Not Currently    Comment: occasionally   Drug use: Never     Aurea Goodell B, NP 05/12/24 1032  "

## 2024-05-12 NOTE — ED Triage Notes (Signed)
 Pt presents for re-evaluation of BV. Pt reports that she was seen in December for same symptoms. Pt completed Flagyl  and symptoms improved some but have not fully resolved. Last unprotected encounter was 04/27/24, pt came in for STD testing 2 days after this but is concerned that results may not be accurate. Patient requesting repeat STD testing. Pt reports clear vaginal discharge, and fishy odor. Pt denies any abdominal cramping or abnormal vaginal bleeding.

## 2024-05-13 LAB — CERVICOVAGINAL ANCILLARY ONLY
Bacterial Vaginitis (gardnerella): NEGATIVE
Candida Glabrata: NEGATIVE
Candida Vaginitis: NEGATIVE
Chlamydia: NEGATIVE
Comment: NEGATIVE
Comment: NEGATIVE
Comment: NEGATIVE
Comment: NEGATIVE
Comment: NEGATIVE
Comment: NORMAL
Neisseria Gonorrhea: NEGATIVE
Trichomonas: NEGATIVE

## 2024-05-21 ENCOUNTER — Encounter (HOSPITAL_COMMUNITY): Payer: Self-pay

## 2024-05-21 ENCOUNTER — Ambulatory Visit (HOSPITAL_COMMUNITY)
Admission: EM | Admit: 2024-05-21 | Discharge: 2024-05-21 | Disposition: A | Discharge status: Home / Self Care | Attending: Nurse Practitioner | Admitting: Nurse Practitioner

## 2024-05-21 DIAGNOSIS — J069 Acute upper respiratory infection, unspecified: Secondary | ICD-10-CM | POA: Diagnosis not present

## 2024-05-21 DIAGNOSIS — R1030 Lower abdominal pain, unspecified: Secondary | ICD-10-CM | POA: Diagnosis not present

## 2024-05-21 DIAGNOSIS — Z202 Contact with and (suspected) exposure to infections with a predominantly sexual mode of transmission: Secondary | ICD-10-CM | POA: Diagnosis not present

## 2024-05-21 LAB — HIV ANTIBODY (ROUTINE TESTING W REFLEX): HIV Screen 4th Generation wRfx: NONREACTIVE

## 2024-05-21 LAB — POCT URINALYSIS DIP (MANUAL ENTRY)
Bilirubin, UA: NEGATIVE
Glucose, UA: NEGATIVE mg/dL
Ketones, POC UA: NEGATIVE mg/dL
Nitrite, UA: NEGATIVE
Protein Ur, POC: NEGATIVE mg/dL
Spec Grav, UA: 1.025
Urobilinogen, UA: 0.2 U/dL
pH, UA: 6.5

## 2024-05-21 LAB — POCT URINE PREGNANCY: Preg Test, Ur: NEGATIVE

## 2024-05-21 NOTE — ED Triage Notes (Addendum)
 Patient reports that she had unprotected sex on 04/27/24 and came 2 days later and had a complete work up done and everything was negative.  Paatient states she later found out that the person she was with was exposed to possible HIV.  Patient states she is having lower abdominal pain, has a rash to her right upper arm which began a week ago, fatigue, and diarrhea. Patient states she has had abdominal pain 2 months ago and was referred to a gastroenterologist, but the pain returned to her lower abdomen.  Patient added that she has had a non productive cough, sneezing x 3 days.

## 2024-05-21 NOTE — Discharge Instructions (Addendum)
 You were tested today for gonorrhea, chlamydia, trichomonas, HIV, and syphilis.  Will contact you if anything comes back positive.  Recommend follow-up with infectious disease clinic-contact information has been provided to discuss concerns.  Please call to make an appointment.  The cough and congestion symptoms are consistent with a viral upper respiratory infection.  You can take Mucinex to help with symptoms.  Make sure you are drinking plenty of fluids.  Seek care if your symptoms do not improve after 2 weeks.  It is not clear what is causing the lower abdominal pain.  The urinalysis today was sent for urine culture to make sure you do not have a urinary tract infection.  If you develop severe abdominal pain, nausea/vomiting and are unable to keep fluids down, please seek care in the emergency room.  Otherwise, you can follow-up with gastroenterology as planned.

## 2024-05-21 NOTE — ED Provider Notes (Signed)
 " [facility]    CSN: [id number] Arrival date & time: 05/21/24  0911      History   Chief Complaint No chief complaint on file.   HPI Jamie Neal is a 29 y.o. female.   Patient presents today with concern for possible exposure to STI.  Reports she is sexually active on 04/27/2024 with 1 partner and shortly after, found out her partner was cheating on her and the person he was cheating on her with may have been exposed to HIV.  Reports she is concerned with new rash that has developed on her right arm that is mostly better, some sneezing and cough and sore throat that started in the past few days and thinks these are symptoms of HIV.  She has also been having lower abdominal pain for the past 2 months and is concerned this may be coming from HIV infection.  Some nausea, however no vomiting.  She also endorses diarrhea.  She has been taking omeprazole  which seems to have helped with symptoms somewhat.  She has follow-up scheduled with gastroenterology.  She denies fever, night sweats, unexplained weight loss, lymphadenopathy.  No coughing up blood.  She endorses a dry cough.  No chest pain or shortness of breath, runny or stuffy nose.  Reports her daughter is in daycare and is also coughing right now.  Reports a rash on her right arm was not itchy and she describes rash as dark dots.  She applies Lubriderm to her skin daily and thinks this has helped the rash resolved.  Reports she is having clear vaginal discharge that she describes as her regular discharge.  No vaginal odor.  No new urinary symptoms.  Denies sexual activity since 04/27/2024.    Past Medical History:  Diagnosis Date   BV (bacterial vaginosis)    Missed period 12/21/2022   UTI (urinary tract infection)     There are no active problems to display for this patient.   Past Surgical History:  Procedure Laterality Date   CESAREAN SECTION  03/12/2020   Procedure: CESAREAN SECTION;  Surgeon: Zina Jerilynn LABOR,  MD;  Location: [facility];  Service: Obstetrics;;    OB History     Gravida  4   Para  3   Term  3   Preterm      AB  1   Living  3      SAB  1   IAB  0   Ectopic  0   Multiple  0   Live Births  3            Home Medications    Prior to Admission medications  Medication Sig Start Date End Date Taking? Authorizing Provider  dicyclomine  (BENTYL ) 20 MG tablet Take 1 tablet (20 mg total) by mouth 2 (two) times daily. Patient not taking: Reported on 05/21/2024 04/22/24   Johnie Flaming A, NP  omeprazole  (PRILOSEC) 20 MG capsule Take 1 capsule (20 mg total) by mouth daily. 04/24/24   Beverley Leita LABOR, PA-C    Family History Family History  Problem Relation Age of Onset   Healthy Mother    Diabetes Father    Hypertension Father     Social History Social History[1]   Allergies   Bee venom and Porcine (pork) protein-containing drug products   Review of Systems Review of Systems Per HPI  Physical Exam Triage Vital Signs ED Triage Vitals [05/21/24 0929]  Encounter Vitals Group     BP 103/72  Girls Systolic BP Percentile      Girls Diastolic BP Percentile      Boys Systolic BP Percentile      Boys Diastolic BP Percentile      Pulse Rate 92     Resp 14     Temp 98.3 F (36.8 C)     Temp Source Oral     SpO2 98 %     Weight      Height      Head Circumference      Peak Flow      Pain Score 7     Pain Loc      Pain Education      Exclude from Growth Chart    No data found.  Updated Vital Signs BP 103/72 (BP Location: Left Arm)   Pulse 92   Temp 98.3 F (36.8 C) (Oral)   Resp 14   LMP 05/01/2024 (Approximate)   SpO2 98%   Visual Acuity Right Eye Distance:   Left Eye Distance:   Bilateral Distance:    Right Eye Near:   Left Eye Near:    Bilateral Near:     Physical Exam Vitals and nursing note reviewed.  Constitutional:      General: She is not in acute distress.    Appearance: Normal appearance. She is not ill-appearing  or toxic-appearing.  HENT:     Head: Normocephalic and atraumatic.     Right Ear: Tympanic membrane, ear canal and external ear normal.     Left Ear: Tympanic membrane, ear canal and external ear normal.     Nose: Congestion present. No rhinorrhea.     Mouth/Throat:     Mouth: Mucous membranes are moist.     Pharynx: Oropharynx is clear. No oropharyngeal exudate or posterior oropharyngeal erythema.  Eyes:     General: No scleral icterus.    Extraocular Movements: Extraocular movements intact.  Cardiovascular:     Rate and Rhythm: Normal rate and regular rhythm.  Pulmonary:     Effort: Pulmonary effort is normal. No respiratory distress.     Breath sounds: Normal breath sounds. No wheezing, rhonchi or rales.  Abdominal:     General: Abdomen is flat. Bowel sounds are normal. There is no distension.     Palpations: Abdomen is soft.     Tenderness: There is no abdominal tenderness. There is no right CVA tenderness, left CVA tenderness, guarding or rebound.  Genitourinary:    Comments: Deferred - self swab performed by patient Musculoskeletal:     Cervical back: Normal range of motion and neck supple.  Lymphadenopathy:     Cervical: No cervical adenopathy.  Skin:    General: Skin is warm and dry.     Coloration: Skin is not jaundiced or pale.     Findings: No erythema or rash.     Comments: Hyperpigmented pinpoint papules to right upper extremity without erythema, warmth, drainage.  Neurological:     Mental Status: She is alert and oriented to person, place, and time.  Psychiatric:        Behavior: Behavior is cooperative.      UC Treatments / Results  Labs (all labs ordered are listed, but only abnormal results are displayed) Labs Reviewed  POCT URINALYSIS DIP (MANUAL ENTRY) - Abnormal; Notable for the following components:      Result Value   Blood, UA small (*)    Leukocytes, UA Trace (*)    All other components within normal limits  URINE CULTURE  HIV ANTIBODY  (ROUTINE TESTING W REFLEX)  SYPHILIS: RPR W/REFLEX TO RPR TITER AND TREPONEMAL ANTIBODIES, TRADITIONAL SCREENING AND DIAGNOSIS ALGORITHM  POCT URINE PREGNANCY  CERVICOVAGINAL ANCILLARY ONLY    EKG   Radiology No results found.  Procedures Procedures (including critical care time)  Medications Ordered in UC Medications - No data to display  Initial Impression / Assessment and Plan / UC Course  I have reviewed the triage vital signs and the nursing notes.  Pertinent labs & imaging results that were available during my care of the patient were reviewed by me and considered in my medical decision making (see chart for details).   Patient is a well-appearing 29 year old female presenting today with concern for possible HIV exposure on 04/27/2024.  Vital signs are stable and patient overall is well-appearing.  STI testing performed today.  I recommended she follow-up with regional infectious disease clinic regarding possible exposure.  Regarding cough and congestion, most likely viral URI, testing deferred at this time and supportive care discussed.  Regarding lower abdominal pain, unclear etiology.  Urinalysis shows a lot of blood and trace leukocytes; urine culture is pending to rule out UTI.  Strict ER precautions discussed in the meantime.  Recommended abstinence until testing returns.  The patient was given the opportunity to ask questions.  All questions answered to their satisfaction.  The patient is in agreement to this plan.   Final Clinical Impressions(s) / UC Diagnoses   Final diagnoses:  Possible exposure to STI  Viral URI with cough  Lower abdominal pain     Discharge Instructions      You were tested today for gonorrhea, chlamydia, trichomonas, HIV, and syphilis.  Will contact you if anything comes back positive.  Recommend follow-up with infectious disease clinic-contact information has been provided to discuss concerns.  Please call to make an appointment.  The cough  and congestion symptoms are consistent with a viral upper respiratory infection.  You can take Mucinex to help with symptoms.  Make sure you are drinking plenty of fluids.  Seek care if your symptoms do not improve after 2 weeks.  It is not clear what is causing the lower abdominal pain.  The urinalysis today was sent for urine culture to make sure you do not have a urinary tract infection.  If you develop severe abdominal pain, nausea/vomiting and are unable to keep fluids down, please seek care in the emergency room.  Otherwise, you can follow-up with gastroenterology as planned.     ED Prescriptions   None    PDMP not reviewed this encounter.     [1]  Social History Tobacco Use   Smoking status: Never   Smokeless tobacco: Never  Vaping Use   Vaping status: Former  Substance Use Topics   Alcohol use: Not Currently    Comment: occasionally   Drug use: Never     Chandra Harlene LABOR, NP 05/21/24 1042  "

## 2024-05-22 ENCOUNTER — Ambulatory Visit (HOSPITAL_COMMUNITY): Payer: Self-pay

## 2024-05-22 LAB — URINE CULTURE: Culture: <10000 — AB

## 2024-05-22 LAB — CERVICOVAGINAL ANCILLARY ONLY
Chlamydia: NEGATIVE
Comment: NEGATIVE
Comment: NEGATIVE
Comment: NORMAL
Neisseria Gonorrhea: NEGATIVE
Trichomonas: NEGATIVE

## 2024-05-22 LAB — SYPHILIS: RPR W/REFLEX TO RPR TITER AND TREPONEMAL ANTIBODIES, TRADITIONAL SCREENING AND DIAGNOSIS ALGORITHM: RPR Ser Ql: NONREACTIVE

## 2024-05-26 ENCOUNTER — Ambulatory Visit: Admitting: Gastroenterology

## 2024-05-27 ENCOUNTER — Ambulatory Visit: Payer: Self-pay

## 2024-05-28 ENCOUNTER — Ambulatory Visit (HOSPITAL_COMMUNITY): Admission: EM | Admit: 2024-05-28 | Discharge: 2024-05-28 | Disposition: A | Discharge status: Home / Self Care

## 2024-05-28 NOTE — ED Notes (Signed)
 Patient call on cell phone, stated she was cleaning out her car and would be right in.

## 2024-05-29 ENCOUNTER — Other Ambulatory Visit: Payer: Self-pay

## 2024-05-29 ENCOUNTER — Encounter (HOSPITAL_COMMUNITY): Payer: Self-pay

## 2024-05-29 ENCOUNTER — Ambulatory Visit (INDEPENDENT_AMBULATORY_CARE_PROVIDER_SITE_OTHER): Admit: 2024-05-29 | Discharge: 2024-05-29 | Attending: Family Medicine

## 2024-05-29 ENCOUNTER — Ambulatory Visit (HOSPITAL_COMMUNITY)

## 2024-05-29 VITALS — BP 95/61 | HR 82 | Temp 98.6°F | Resp 20

## 2024-05-29 DIAGNOSIS — R051 Acute cough: Secondary | ICD-10-CM | POA: Diagnosis not present

## 2024-05-29 DIAGNOSIS — N898 Other specified noninflammatory disorders of vagina: Secondary | ICD-10-CM

## 2024-05-29 DIAGNOSIS — Z3202 Encounter for pregnancy test, result negative: Secondary | ICD-10-CM

## 2024-05-29 LAB — POCT URINE PREGNANCY: Preg Test, Ur: NEGATIVE

## 2024-05-29 LAB — POCT URINALYSIS DIP (MANUAL ENTRY)
Bilirubin, UA: NEGATIVE
Blood, UA: NEGATIVE
Glucose, UA: NEGATIVE mg/dL
Ketones, POC UA: NEGATIVE mg/dL
Nitrite, UA: NEGATIVE
Protein Ur, POC: NEGATIVE mg/dL
Spec Grav, UA: 1.02
Urobilinogen, UA: 0.2 U/dL
pH, UA: 8.5 — AB

## 2024-05-29 LAB — POC SOFIA SARS ANTIGEN FIA: SARS Coronavirus 2 Ag: NEGATIVE

## 2024-05-29 LAB — POCT INFLUENZA A/B
Influenza A, POC: NEGATIVE
Influenza B, POC: NEGATIVE

## 2024-05-29 MED ORDER — FLUCONAZOLE 150 MG PO TABS
150.0000 mg | ORAL_TABLET | Freq: Once | ORAL | 0 refills | Status: AC
  Filled 2024-05-29: qty 2, 2d supply, fill #0

## 2024-05-29 NOTE — Discharge Instructions (Signed)
 I have sent the medicine for a yeast infection to your pharmacy. This is called Diflucan , you will take this once.  If your symptoms do not resolve in the next 2 days after taking, take the second pill. If your lab testing is positive for BV, I will send the additional medicine to treat that.  - You have a viral illness causing your symptoms. - This will get better in the next several days. - You may use Tylenol  and Ibuprofen  as needed for pain. - Over the counter allergy medicine such as Claritin and Flonase may help with your congestion. - Cough drops may help with your throat and cough. - If you do not get better in the next 5 days please return to care. - It is important to stay hydrated, and continue eating while sick.

## 2024-05-29 NOTE — ED Triage Notes (Signed)
 PT reports she has had a very bad sore throat for one week. Pt also has dysuria,vag discharge ,yeast sx's.

## 2024-05-29 NOTE — ED Provider Notes (Cosign Needed)
 " [facility]    CSN: [id number] Arrival date & time: 05/29/24  9076      History   Chief Complaint Chief Complaint  Patient presents with   SEXUALLY TRANSMITTED DISEASE    Entered by patient   Vaginal Discharge   Dysuria   Sore Throat   Cough    HPI Jamie Neal is a 29 y.o. female.   The patient presents with increased vaginal discharge and respiratory symptoms. She is accompanied by her eight-month-old baby.  Vaginal discharge and genitourinary symptoms - Increased clear vaginal discharge - Burning and itching after urination in the vagina, not around her urethra - Suspects yeast infection possibly triggered by use of a bath bomb - HIV testing performed one week ago - No sexual activity since HIV testing  Acute respiratory symptoms - Worsening cough for five days - Severe sore throat - Shortness of breath when lying down - No fever, nausea, or vomiting - Maintaining adequate oral intake - No history of asthma - Had cough before when she was here at urgent care on the 21st  The history is provided by the patient. No language interpreter was used.    Past Medical History:  Diagnosis Date   BV (bacterial vaginosis)    Missed period 12/21/2022   UTI (urinary tract infection)     There are no active problems to display for this patient.   Past Surgical History:  Procedure Laterality Date   CESAREAN SECTION  03/12/2020   Procedure: CESAREAN SECTION;  Surgeon: Zina Jerilynn LABOR, MD;  Location: [facility];  Service: Obstetrics;;    OB History     Gravida  4   Para  3   Term  3   Preterm      AB  1   Living  3      SAB  1   IAB  0   Ectopic  0   Multiple  0   Live Births  3            Home Medications    Prior to Admission medications  Medication Sig Start Date End Date Taking? Authorizing Provider  dicyclomine  (BENTYL ) 20 MG tablet Take 1 tablet (20 mg total) by mouth 2 (two) times daily. Patient not  taking: Reported on 05/21/2024 04/22/24   Johnie Flaming A, NP  omeprazole  (PRILOSEC) 20 MG capsule Take 1 capsule (20 mg total) by mouth daily. 04/24/24   Beverley Leita LABOR, PA-C    Family History Family History  Problem Relation Age of Onset   Healthy Mother    Diabetes Father    Hypertension Father     Social History Social History[1]   Allergies   Bee venom and Porcine (pork) protein-containing drug products   Review of Systems Review of Systems   Physical Exam Triage Vital Signs ED Triage Vitals  Encounter Vitals Group     BP 05/29/24 1017 95/61     Girls Systolic BP Percentile --      Girls Diastolic BP Percentile --      Boys Systolic BP Percentile --      Boys Diastolic BP Percentile --      Pulse Rate 05/29/24 1017 82     Resp 05/29/24 1017 20     Temp 05/29/24 1017 98.6 F (37 C)     Temp src --      SpO2 05/29/24 1017 97 %     Weight --  Height --      Head Circumference --      Peak Flow --      Pain Score 05/29/24 1015 8     Pain Loc --      Pain Education --      Exclude from Growth Chart --    No data found.  Updated Vital Signs BP 95/61   Pulse 82   Temp 98.6 F (37 C)   Resp 20   LMP 05/01/2024 (Approximate)   SpO2 97%   Visual Acuity Right Eye Distance:   Left Eye Distance:   Bilateral Distance:    Right Eye Near:   Left Eye Near:    Bilateral Near:     Physical Exam Vitals and nursing note reviewed.  Constitutional:      General: She is not in acute distress.    Appearance: She is well-developed.  HENT:     Head: Normocephalic and atraumatic.     Nose: No congestion.     Comments: Bilaterally inflamed nasal turbinates    Mouth/Throat:     Mouth: Mucous membranes are moist.     Pharynx: Posterior oropharyngeal erythema present.     Tonsils: No tonsillar exudate or tonsillar abscesses. 0 on the right. 0 on the left.  Eyes:     Conjunctiva/sclera: Conjunctivae normal.  Cardiovascular:     Rate and Rhythm: Normal  rate and regular rhythm.     Heart sounds: No murmur heard. Pulmonary:     Effort: Pulmonary effort is normal. No respiratory distress.     Breath sounds: Normal breath sounds.  Abdominal:     Palpations: Abdomen is soft.     Tenderness: There is no abdominal tenderness.  Musculoskeletal:        General: No swelling.     Cervical back: Neck supple.  Skin:    General: Skin is warm and dry.     Capillary Refill: Capillary refill takes less than 2 seconds.  Neurological:     Mental Status: She is alert.  Psychiatric:        Mood and Affect: Mood normal.      UC Treatments / Results  Labs (all labs ordered are listed, but only abnormal results are displayed) Labs Reviewed  POCT URINALYSIS DIP (MANUAL ENTRY) - Abnormal; Notable for the following components:      Result Value   pH, UA 8.5 (*)    Leukocytes, UA Trace (*)    All other components within normal limits  POCT URINE PREGNANCY  POC SOFIA SARS ANTIGEN FIA  POCT INFLUENZA A/B  CERVICOVAGINAL ANCILLARY ONLY    EKG   Radiology No results found.  Procedures Procedures (including critical care time)  Medications Ordered in UC Medications - No data to display  Initial Impression / Assessment and Plan / UC Course  I have reviewed the triage vital signs and the nursing notes.  Pertinent labs & imaging results that were available during my care of the patient were reviewed by me and considered in my medical decision making (see chart for details).  Clinical Course as of 05/29/24 1104  Thu May 29, 2024  1048 POCT urinalysis dipstick(!) Urine not consistent with UTI. Pregnancy test negative. [MQ]  1104 Negative Flu and Covid [MQ]    Clinical Course User Index [MQ] Alba Sharper, MD    Symptoms clinically consistent with yeast infection. Will treat empirically with Diflucan . Urine testing not consistent with UTI. Suspect her urinary symptoms are secondary to vaginal  irritation. Swab for yeast and bacterial  vaginosis performed today. Will treat for BV if positive.  Do not feel need to test for STIs as no sexual activity since prior visit and testing.  Additionally, presents with clinical history and exam consistent with viral upper respiratory infection versus allergic rhinitis. VSS, and exam is reassuring with low suspicion for AOM, pneumonia, or strep A pharyngitis requiring antibiotic treatment. Discussed supportive care with patient and discussed strict return precautions for dehydration and difficulty breathing listed in the AVS. Recommended to trial over-the-counter allergy medicine such as Zyrtec or Claritin in addition to Flonase daily.   Final Clinical Impressions(s) / UC Diagnoses   Final diagnoses:  Vaginal discharge  Acute cough     Discharge Instructions      I have sent the medicine for a yeast infection to your pharmacy. This is called Diflucan , you will take this once.  If your symptoms do not resolve in the next 2 days after taking, take the second pill. If your lab testing is positive for BV, I will send the additional medicine to treat that.  - You have a viral illness causing your symptoms. - This will get better in the next several days. - You may use Tylenol  and Ibuprofen  as needed for pain. - Over the counter allergy medicine such as Claritin and Flonase may help with your congestion. - Cough drops may help with your throat and cough. - If you do not get better in the next 5 days please return to care. - It is important to stay hydrated, and continue eating while sick.      ED Prescriptions   None    PDMP not reviewed this encounter.     [1] Social History Tobacco Use   Smoking status: Never   Smokeless tobacco: Never  Vaping Use   Vaping status: Former  Substance Use Topics   Alcohol use: Not Currently    Comment: occasionally   Drug use: Never    Alba Sharper, MD 05/29/24 1104  "

## 2024-05-30 LAB — CERVICOVAGINAL ANCILLARY ONLY
Bacterial Vaginitis (gardnerella): NEGATIVE
Candida Glabrata: NEGATIVE
Candida Vaginitis: POSITIVE — AB
Comment: NEGATIVE
Comment: NEGATIVE
Comment: NEGATIVE

## 2024-05-31 ENCOUNTER — Ambulatory Visit (HOSPITAL_COMMUNITY): Payer: Self-pay

## 2024-06-04 ENCOUNTER — Other Ambulatory Visit: Payer: Self-pay

## 2024-06-04 MED ORDER — OMEPRAZOLE 40 MG PO CPDR
40.0000 mg | DELAYED_RELEASE_CAPSULE | Freq: Every day | ORAL | 3 refills | Status: AC
  Filled 2024-06-04: qty 30, 30d supply, fill #0

## 2024-06-09 ENCOUNTER — Ambulatory Visit: Admitting: Obstetrics
